# Patient Record
Sex: Female | Born: 1980 | Race: White | Hispanic: No | Marital: Married | Smoking: Never smoker
Health system: Southern US, Community
[De-identification: ages and names within clinical notes are randomized; demographics above are authoritative.]

## PROBLEM LIST (undated history)

## (undated) DIAGNOSIS — J45909 Unspecified asthma, uncomplicated: Secondary | ICD-10-CM

## (undated) DIAGNOSIS — K219 Gastro-esophageal reflux disease without esophagitis: Secondary | ICD-10-CM

## (undated) DIAGNOSIS — Z8619 Personal history of other infectious and parasitic diseases: Secondary | ICD-10-CM

## (undated) DIAGNOSIS — J302 Other seasonal allergic rhinitis: Secondary | ICD-10-CM

## (undated) DIAGNOSIS — Z9109 Other allergy status, other than to drugs and biological substances: Secondary | ICD-10-CM

## (undated) DIAGNOSIS — N390 Urinary tract infection, site not specified: Secondary | ICD-10-CM

## (undated) DIAGNOSIS — D649 Anemia, unspecified: Secondary | ICD-10-CM

## (undated) DIAGNOSIS — I82811 Embolism and thrombosis of superficial veins of right lower extremities: Secondary | ICD-10-CM

## (undated) DIAGNOSIS — N939 Abnormal uterine and vaginal bleeding, unspecified: Secondary | ICD-10-CM

## (undated) DIAGNOSIS — L309 Dermatitis, unspecified: Secondary | ICD-10-CM

## (undated) HISTORY — PX: NO PAST SURGERIES: SHX2092

## (undated) HISTORY — DX: Personal history of other infectious and parasitic diseases: Z86.19

## (undated) HISTORY — DX: Embolism and thrombosis of superficial veins of right lower extremity: I82.811

## (undated) HISTORY — DX: Other seasonal allergic rhinitis: J30.2

## (undated) HISTORY — DX: Urinary tract infection, site not specified: N39.0

## (undated) HISTORY — DX: Other allergy status, other than to drugs and biological substances: Z91.09

## (undated) HISTORY — DX: Unspecified asthma, uncomplicated: J45.909

---

## 2014-06-18 DIAGNOSIS — I82811 Embolism and thrombosis of superficial veins of right lower extremities: Secondary | ICD-10-CM

## 2014-06-18 HISTORY — DX: Embolism and thrombosis of superficial veins of right lower extremity: I82.811

## 2014-07-23 ENCOUNTER — Ambulatory Visit: Payer: Self-pay | Admitting: Physician Assistant

## 2014-08-04 ENCOUNTER — Ambulatory Visit (INDEPENDENT_AMBULATORY_CARE_PROVIDER_SITE_OTHER): Payer: BLUE CROSS/BLUE SHIELD | Admitting: Physician Assistant

## 2014-08-04 ENCOUNTER — Encounter: Payer: Self-pay | Admitting: Physician Assistant

## 2014-08-04 VITALS — BP 127/80 | HR 84 | Temp 98.4°F | Resp 16 | Ht 64.0 in | Wt 169.2 lb

## 2014-08-04 DIAGNOSIS — J4520 Mild intermittent asthma, uncomplicated: Secondary | ICD-10-CM

## 2014-08-04 DIAGNOSIS — E8809 Other disorders of plasma-protein metabolism, not elsewhere classified: Secondary | ICD-10-CM | POA: Insufficient documentation

## 2014-08-04 DIAGNOSIS — Z0000 Encounter for general adult medical examination without abnormal findings: Secondary | ICD-10-CM

## 2014-08-04 DIAGNOSIS — E756 Lipid storage disorder, unspecified: Secondary | ICD-10-CM

## 2014-08-04 DIAGNOSIS — J45909 Unspecified asthma, uncomplicated: Secondary | ICD-10-CM | POA: Insufficient documentation

## 2014-08-04 DIAGNOSIS — Z23 Encounter for immunization: Secondary | ICD-10-CM

## 2014-08-04 NOTE — Progress Notes (Signed)
Patient presents to clinic today to establish care.  Acute Concerns: Patient requesting CPE and Biometric screen.  Is not fasting for labs.  Chronic Issues: Asthma -- in childhood.  Still with symptoms very infrequently.  Uses her SABA maybe twice per year.  Denies nighttime symptoms or exacerbations.  Health Maintenance: Dental -- up-to-date Vision -- up-to-date Immunizations -- Declines flu shot. Is due for Tetanus booster.  Will be getting today. PAP -- November 2015; no abnormal findings.  Followed by OB/GYN [place] Dec)  Past Medical History  Diagnosis Date  . Asthma   . History of chicken pox   . Seasonal allergies   . Environmental allergies   . UTI (lower urinary tract infection)     Past Surgical History  Procedure Laterality Date  . No past surgeries      No current outpatient prescriptions on file prior to visit.   No current facility-administered medications on file prior to visit.    Allergies  Allergen Reactions  . Gelatin Anaphylaxis    Nothing Made In Gel [caps]  . Other Anaphylaxis    ALL Mammal Meat Products    Family History  Problem Relation Age of Onset  . Hyperlipidemia Father     Living  . Diabetes Mother     Living  . Hypertension Mother   . Hypertension Father   . Allergy (severe) Mother     Alpha-gal  . Other Mother     IVP Dye  . Heart disease Maternal Grandfather   . COPD Paternal Grandfather   . Healthy Sister     x1  . Allergies Daughter     x1  . Healthy Daughter     x2    History   Social History  . Marital Status: Married    Spouse Name: N/A  . Number of Children: N/A  . Years of Education: N/A   Occupational History  . farmer    Social History Main Topics  . Smoking status: Never Smoker   . Smokeless tobacco: Never Used  . Alcohol Use: 0.6 oz/week    1 Standard drinks or equivalent per week  . Drug Use: No  . Sexual Activity:    Partners: Male   Other Topics Concern  . Not on file   Social  History Narrative   Review of Systems  Constitutional: Negative for fever and weight loss.  HENT: Negative for ear discharge, ear pain, hearing loss and tinnitus.   Eyes: Negative for blurred vision, double vision, photophobia and pain.  Respiratory: Negative for cough and shortness of breath.   Cardiovascular: Negative for chest pain and palpitations.  Gastrointestinal: Negative for heartburn, nausea, vomiting, abdominal pain, diarrhea, constipation, blood in stool and melena.  Genitourinary: Negative for dysuria, urgency, frequency, hematuria and flank pain.  Musculoskeletal: Negative for falls.  Neurological: Negative for dizziness, loss of consciousness and headaches.  Endo/Heme/Allergies: Negative for environmental allergies.  Psychiatric/Behavioral: Negative for depression, suicidal ideas, hallucinations and substance abuse. The patient is not nervous/anxious and does not have insomnia.     BP 127/80 mmHg  Pulse 84  Temp(Src) 98.4 F (36.9 C) (Oral)  Resp 16  Ht  (1.626 m)  Wt 169 lb 4 oz (76.771 kg)  BMI 29.04 kg/m2  SpO2 100%  LMP 07/21/2014  Physical Exam  Constitutional: She is oriented to person, place, and time and well-developed, well-nourished, and in no distress.  HENT:  Head: Normocephalic and atraumatic.  Right Ear: Tympanic membrane, external ear and  ear canal normal.  Left Ear: Tympanic membrane, external ear and ear canal normal.  Nose: Nose normal. No mucosal edema.  Mouth/Throat: Uvula is midline, oropharynx is clear and moist and mucous membranes are normal. No oropharyngeal exudate or posterior oropharyngeal erythema.  Eyes: Conjunctivae are normal. Pupils are equal, round, and reactive to light.  Neck: Neck supple. No thyromegaly present.  Cardiovascular: Normal rate, regular rhythm, normal heart sounds and intact distal pulses.   Pulmonary/Chest: Effort normal and breath sounds normal. No respiratory distress. She has no wheezes. She has no rales.    Abdominal: Soft. Bowel sounds are normal. She exhibits no distension and no mass. There is no tenderness. There is no rebound and no guarding.  Lymphadenopathy:    She has no cervical adenopathy.  Neurological: She is alert and oriented to person, place, and time. No cranial nerve deficit.  Skin: Skin is warm and dry. No rash noted.  Psychiatric: Affect normal.  Vitals reviewed.  Assessment/Plan: Visit for preventive health examination I have reviewed the patient's medical history in detail and updated the computerized patient record. Flu shot declined.TDaP given today by nursing staff.  PHQ-9 Depression Screen performed with score of 0.  Preventive care discussed with patient.  Handout given.  Orders for labs placed.  Patient will return fasting for blood work.  Will call once Biometric Forms are complete.   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   Allergic asthma Asthma History obtained.  Very rare symptoms.  Will keep Albuterol on hand just if needed.  No further assessment needed.

## 2014-08-04 NOTE — Patient Instructions (Signed)
Please schedule a lab appointment for your fasting blood work. I will call you once I have your results. If anything is abnormal, we will treat you accordingly and get you back in for a follow-up appointment. If all is good, I want to see you yearly for a physical and then whenever you need me.  I will let you know once I have faxed your forms over.  Preventive Care for Adults A healthy lifestyle and preventive care can promote health and wellness. Preventive health guidelines for women include the following key practices.  A routine yearly physical is a good way to check with your health care provider about your health and preventive screening. It is a chance to share any concerns and updates on your health and to receive a thorough exam.  Visit your dentist for a routine exam and preventive care every 6 months. Brush your teeth twice a day and floss once a day. Good oral hygiene prevents tooth decay and gum disease.  The frequency of eye exams is based on your age, health, family medical history, use of contact lenses, and other factors. Follow your health care provider's recommendations for frequency of eye exams.  Eat a healthy diet. Foods like vegetables, fruits, whole grains, low-fat dairy products, and lean protein foods contain the nutrients you need without too many calories. Decrease your intake of foods high in solid fats, added sugars, and salt. Eat the right amount of calories for you.Get information about a proper diet from your health care provider, if necessary.  Regular physical exercise is one of the most important things you can do for your health. Most adults should get at least 150 minutes of moderate-intensity exercise (any activity that increases your heart rate and causes you to sweat) each week. In addition, most adults need muscle-strengthening exercises on 2 or more days a week.  Maintain a healthy weight. The body mass index (BMI) is a screening tool to identify  possible weight problems. It provides an estimate of body fat based on height and weight. Your health care provider can find your BMI and can help you achieve or maintain a healthy weight.For adults 20 years and older:  A BMI below 18.5 is considered underweight.  A BMI of 18.5 to 24.9 is normal.  A BMI of 25 to 29.9 is considered overweight.  A BMI of 30 and above is considered obese.  Maintain normal blood lipids and cholesterol levels by exercising and minimizing your intake of saturated fat. Eat a balanced diet with plenty of fruit and vegetables. Blood tests for lipids and cholesterol should begin at age 9 and be repeated every 5 years. If your lipid or cholesterol levels are high, you are over 50, or you are at high risk for heart disease, you may need your cholesterol levels checked more frequently.Ongoing high lipid and cholesterol levels should be treated with medicines if diet and exercise are not working.  If you smoke, find out from your health care provider how to quit. If you do not use tobacco, do not start.  Lung cancer screening is recommended for adults aged 74-80 years who are at high risk for developing lung cancer because of a history of smoking. A yearly low-dose CT scan of the lungs is recommended for people who have at least a 30-pack-year history of smoking and are a current smoker or have quit within the past 15 years. A pack year of smoking is smoking an average of 1 pack of cigarettes a  day for 1 year (for example: 1 pack a day for 30 years or 2 packs a day for 15 years). Yearly screening should continue until the smoker has stopped smoking for at least 15 years. Yearly screening should be stopped for people who develop a health problem that would prevent them from having lung cancer treatment.  If you are pregnant, do not drink alcohol. If you are breastfeeding, be very cautious about drinking alcohol. If you are not pregnant and choose to drink alcohol, do not have  more than 1 drink per day. One drink is considered to be 12 ounces (355 mL) of beer, 5 ounces (148 mL) of wine, or 1.5 ounces (44 mL) of liquor.  Avoid use of street drugs. Do not share needles with anyone. Ask for help if you need support or instructions about stopping the use of drugs.  High blood pressure causes heart disease and increases the risk of stroke. Your blood pressure should be checked at least every 1 to 2 years. Ongoing high blood pressure should be treated with medicines if weight loss and exercise do not work.  If you are 65-63 years old, ask your health care provider if you should take aspirin to prevent strokes.  Diabetes screening involves taking a blood sample to check your fasting blood sugar level. This should be done once every 3 years, after age 55, if you are within normal weight and without risk factors for diabetes. Testing should be considered at a younger age or be carried out more frequently if you are overweight and have at least 1 risk factor for diabetes.  Breast cancer screening is essential preventive care for women. You should practice "breast self-awareness." This means understanding the normal appearance and feel of your breasts and may include breast self-examination. Any changes detected, no matter how small, should be reported to a health care provider. Women in their 59s and 30s should have a clinical breast exam (CBE) by a health care provider as part of a regular health exam every 1 to 3 years. After age 36, women should have a CBE every year. Starting at age 44, women should consider having a mammogram (breast X-ray test) every year. Women who have a family history of breast cancer should talk to their health care provider about genetic screening. Women at a high risk of breast cancer should talk to their health care providers about having an MRI and a mammogram every year.  Breast cancer gene (BRCA)-related cancer risk assessment is recommended for women  who have family members with BRCA-related cancers. BRCA-related cancers include breast, ovarian, tubal, and peritoneal cancers. Having family members with these cancers may be associated with an increased risk for harmful changes (mutations) in the breast cancer genes BRCA1 and BRCA2. Results of the assessment will determine the need for genetic counseling and BRCA1 and BRCA2 testing.  Routine pelvic exams to screen for cancer are no longer recommended for nonpregnant women who are considered low risk for cancer of the pelvic organs (ovaries, uterus, and vagina) and who do not have symptoms. Ask your health care provider if a screening pelvic exam is right for you.  If you have had past treatment for cervical cancer or a condition that could lead to cancer, you need Pap tests and screening for cancer for at least 20 years after your treatment. If Pap tests have been discontinued, your risk factors (such as having a new sexual partner) need to be reassessed to determine if screening should be  resumed. Some women have medical problems that increase the chance of getting cervical cancer. In these cases, your health care provider may recommend more frequent screening and Pap tests.  The HPV test is an additional test that may be used for cervical cancer screening. The HPV test looks for the virus that can cause the cell changes on the cervix. The cells collected during the Pap test can be tested for HPV. The HPV test could be used to screen women aged 60 years and older, and should be used in women of any age who have unclear Pap test results. After the age of 61, women should have HPV testing at the same frequency as a Pap test.  Colorectal cancer can be detected and often prevented. Most routine colorectal cancer screening begins at the age of 39 years and continues through age 27 years. However, your health care provider may recommend screening at an earlier age if you have risk factors for colon cancer. On a  yearly basis, your health care provider may provide home test kits to check for hidden blood in the stool. Use of a small camera at the end of a tube, to directly examine the colon (sigmoidoscopy or colonoscopy), can detect the earliest forms of colorectal cancer. Talk to your health care provider about this at age 16, when routine screening begins. Direct exam of the colon should be repeated every 5-10 years through age 62 years, unless early forms of pre-cancerous polyps or small growths are found.  People who are at an increased risk for hepatitis B should be screened for this virus. You are considered at high risk for hepatitis B if:  You were born in a country where hepatitis B occurs often. Talk with your health care provider about which countries are considered high risk.  Your parents were born in a high-risk country and you have not received a shot to protect against hepatitis B (hepatitis B vaccine).  You have HIV or AIDS.  You use needles to inject street drugs.  You live with, or have sex with, someone who has hepatitis B.  You get hemodialysis treatment.  You take certain medicines for conditions like cancer, organ transplantation, and autoimmune conditions.  Hepatitis C blood testing is recommended for all people born from [birth year] through 1965 and any individual with known risks for hepatitis C.  Practice safe sex. Use condoms and avoid high-risk sexual practices to reduce the spread of sexually transmitted infections (STIs). STIs include gonorrhea, chlamydia, syphilis, trichomonas, herpes, HPV, and human immunodeficiency virus (HIV). Herpes, HIV, and HPV are viral illnesses that have no cure. They can result in disability, cancer, and death.  You should be screened for sexually transmitted illnesses (STIs) including gonorrhea and chlamydia if:  You are sexually active and are younger than 24 years.  You are older than 24 years and your health care provider tells you that you  are at risk for this type of infection.  Your sexual activity has changed since you were last screened and you are at an increased risk for chlamydia or gonorrhea. Ask your health care provider if you are at risk.  If you are at risk of being infected with HIV, it is recommended that you take a prescription medicine daily to prevent HIV infection. This is called preexposure prophylaxis (PrEP). You are considered at risk if:  You are a heterosexual woman, are sexually active, and are at increased risk for HIV infection.  You take drugs by injection.  You  are sexually active with a partner who has HIV.  Talk with your health care provider about whether you are at high risk of being infected with HIV. If you choose to begin PrEP, you should first be tested for HIV. You should then be tested every 3 months for as long as you are taking PrEP.  Osteoporosis is a disease in which the bones lose minerals and strength with aging. This can result in serious bone fractures or breaks. The risk of osteoporosis can be identified using a bone density scan. Women ages 65 years and over and women at risk for fractures or osteoporosis should discuss screening with their health care providers. Ask your health care provider whether you should take a calcium supplement or vitamin D to reduce the rate of osteoporosis.  Menopause can be associated with physical symptoms and risks. Hormone replacement therapy is available to decrease symptoms and risks. You should talk to your health care provider about whether hormone replacement therapy is right for you.  Use sunscreen. Apply sunscreen liberally and repeatedly throughout the day. You should seek shade when your shadow is shorter than you. Protect yourself by wearing long sleeves, pants, a wide-brimmed hat, and sunglasses year round, whenever you are outdoors.  Once a month, do a whole body skin exam, using a mirror to look at the skin on your back. Tell your health  care provider of new moles, moles that have irregular borders, moles that are larger than a pencil eraser, or moles that have changed in shape or color.  Stay current with required vaccines (immunizations).  Influenza vaccine. All adults should be immunized every year.  Tetanus, diphtheria, and acellular pertussis (Td, Tdap) vaccine. Pregnant women should receive 1 dose of Tdap vaccine during each pregnancy. The dose should be obtained regardless of the length of time since the last dose. Immunization is preferred during the 27th-36th week of gestation. An adult who has not previously received Tdap or who does not know her vaccine status should receive 1 dose of Tdap. This initial dose should be followed by tetanus and diphtheria toxoids (Td) booster doses every 10 years. Adults with an unknown or incomplete history of completing a 3-dose immunization series with Td-containing vaccines should begin or complete a primary immunization series including a Tdap dose. Adults should receive a Td booster every 10 years.  Varicella vaccine. An adult without evidence of immunity to varicella should receive 2 doses or a second dose if she has previously received 1 dose. Pregnant females who do not have evidence of immunity should receive the first dose after pregnancy. This first dose should be obtained before leaving the health care facility. The second dose should be obtained 4-8 weeks after the first dose.  Human papillomavirus (HPV) vaccine. Females aged 13-26 years who have not received the vaccine previously should obtain the 3-dose series. The vaccine is not recommended for use in pregnant females. However, pregnancy testing is not needed before receiving a dose. If a female is found to be pregnant after receiving a dose, no treatment is needed. In that case, the remaining doses should be delayed until after the pregnancy. Immunization is recommended for any person with an immunocompromised condition through  the age of 68 years if she did not get any or all doses earlier. During the 3-dose series, the second dose should be obtained 4-8 weeks after the first dose. The third dose should be obtained 24 weeks after the first dose and 16 weeks after the second  dose.  Zoster vaccine. One dose is recommended for adults aged 19 years or older unless certain conditions are present.  Measles, mumps, and rubella (MMR) vaccine. Adults born before 58 generally are considered immune to measles and mumps. Adults born in [birth year] or later should have 1 or more doses of MMR vaccine unless there is a contraindication to the vaccine or there is laboratory evidence of immunity to each of the three diseases. A routine second dose of MMR vaccine should be obtained at least 28 days after the first dose for students attending postsecondary schools, health care workers, or international travelers. People who received inactivated measles vaccine or an unknown type of measles vaccine during 1963-1967 should receive 2 doses of MMR vaccine. People who received inactivated mumps vaccine or an unknown type of mumps vaccine before 1979 and are at high risk for mumps infection should consider immunization with 2 doses of MMR vaccine. For females of childbearing age, rubella immunity should be determined. If there is no evidence of immunity, females who are not pregnant should be vaccinated. If there is no evidence of immunity, females who are pregnant should delay immunization until after pregnancy. Unvaccinated health care workers born before 60 who lack laboratory evidence of measles, mumps, or rubella immunity or laboratory confirmation of disease should consider measles and mumps immunization with 2 doses of MMR vaccine or rubella immunization with 1 dose of MMR vaccine.  Pneumococcal 13-valent conjugate (PCV13) vaccine. When indicated, a person who is uncertain of her immunization history and has no record of immunization should receive the  PCV13 vaccine. An adult aged 43 years or older who has certain medical conditions and has not been previously immunized should receive 1 dose of PCV13 vaccine. This PCV13 should be followed with a dose of pneumococcal polysaccharide (PPSV23) vaccine. The PPSV23 vaccine dose should be obtained at least 8 weeks after the dose of PCV13 vaccine. An adult aged 21 years or older who has certain medical conditions and previously received 1 or more doses of PPSV23 vaccine should receive 1 dose of PCV13. The PCV13 vaccine dose should be obtained 1 or more years after the last PPSV23 vaccine dose.  Pneumococcal polysaccharide (PPSV23) vaccine. When PCV13 is also indicated, PCV13 should be obtained first. All adults aged 66 years and older should be immunized. An adult younger than age 64 years who has certain medical conditions should be immunized. Any person who resides in a nursing home or long-term care facility should be immunized. An adult smoker should be immunized. People with an immunocompromised condition and certain other conditions should receive both PCV13 and PPSV23 vaccines. People with human immunodeficiency virus (HIV) infection should be immunized as soon as possible after diagnosis. Immunization during chemotherapy or radiation therapy should be avoided. Routine use of PPSV23 vaccine is not recommended for American Indians, [place] Natives, or people younger than 65 years unless there are medical conditions that require PPSV23 vaccine. When indicated, people who have unknown immunization and have no record of immunization should receive PPSV23 vaccine. One-time revaccination 5 years after the first dose of PPSV23 is recommended for people aged 19-64 years who have chronic kidney failure, nephrotic syndrome, asplenia, or immunocompromised conditions. People who received 1-2 doses of PPSV23 before age 75 years should receive another dose of PPSV23 vaccine at age 84 years or later if at least 5 years have  passed since the previous dose. Doses of PPSV23 are not needed for people immunized with PPSV23 at or after age 17 years.  Meningococcal vaccine. Adults with asplenia or persistent complement component deficiencies should receive 2 doses of quadrivalent meningococcal conjugate (MenACWY-D) vaccine. The doses should be obtained at least 2 months apart. Microbiologists working with certain meningococcal bacteria, [place] recruits, people at risk during an outbreak, and people who travel to or live in countries with a high rate of meningitis should be immunized. A first-year college student up through age 3 years who is living in a residence hall should receive a dose if she did not receive a dose on or after her 16th birthday. Adults who have certain high-risk conditions should receive one or more doses of vaccine.  Hepatitis A vaccine. Adults who wish to be protected from this disease, have certain high-risk conditions, work with hepatitis A-infected animals, work in hepatitis A research labs, or travel to or work in countries with a high rate of hepatitis A should be immunized. Adults who were previously unvaccinated and who anticipate close contact with an international adoptee during the first 60 days after arrival in the [place] from a country with a high rate of hepatitis A should be immunized.  Hepatitis B vaccine. Adults who wish to be protected from this disease, have certain high-risk conditions, may be exposed to blood or other infectious body fluids, are household contacts or sex partners of hepatitis B positive people, are clients or workers in certain care facilities, or travel to or work in countries with a high rate of hepatitis B should be immunized.  Haemophilus influenzae type b (Hib) vaccine. A previously unvaccinated person with asplenia or sickle cell disease or having a scheduled splenectomy should receive 1 dose of Hib vaccine. Regardless of previous immunization, a recipient of  a hematopoietic stem cell transplant should receive a 3-dose series 6-12 months after her successful transplant. Hib vaccine is not recommended for adults with HIV infection. Preventive Services / Frequency Ages 70 to 30 years  Blood pressure check.** / Every 1 to 2 years.  Lipid and cholesterol check.** / Every 5 years beginning at age 5.  Clinical breast exam.** / Every 3 years for women in their 61s and 38s.  BRCA-related cancer risk assessment.** / For women who have family members with a BRCA-related cancer (breast, ovarian, tubal, or peritoneal cancers).  Pap test.** / Every 2 years from ages 4 through 62. Every 3 years starting at age 16 through age 31 or 65 with a history of 3 consecutive normal Pap tests.  HPV screening.** / Every 3 years from ages 66 through ages 23 to 6 with a history of 3 consecutive normal Pap tests.  Hepatitis C blood test.** / For any individual with known risks for hepatitis C.  Skin self-exam. / Monthly.  Influenza vaccine. / Every year.  Tetanus, diphtheria, and acellular pertussis (Tdap, Td) vaccine.** / Consult your health care provider. Pregnant women should receive 1 dose of Tdap vaccine during each pregnancy. 1 dose of Td every 10 years.  Varicella vaccine.** / Consult your health care provider. Pregnant females who do not have evidence of immunity should receive the first dose after pregnancy.  HPV vaccine. / 3 doses over 6 months, if 54 and younger. The vaccine is not recommended for use in pregnant females. However, pregnancy testing is not needed before receiving a dose.  Measles, mumps, rubella (MMR) vaccine.** / You need at least 1 dose of MMR if you were born in 1957 or later. You may also need a 2nd dose. For females of childbearing age, rubella immunity should  be determined. If there is no evidence of immunity, females who are not pregnant should be vaccinated. If there is no evidence of immunity, females who are pregnant should delay  immunization until after pregnancy.  Pneumococcal 13-valent conjugate (PCV13) vaccine.** / Consult your health care provider.  Pneumococcal polysaccharide (PPSV23) vaccine.** / 1 to 2 doses if you smoke cigarettes or if you have certain conditions.  Meningococcal vaccine.** / 1 dose if you are age 65 to 49 years and a Market researcher living in a residence hall, or have one of several medical conditions, you need to get vaccinated against meningococcal disease. You may also need additional booster doses.  Hepatitis A vaccine.** / Consult your health care provider.  Hepatitis B vaccine.** / Consult your health care provider.  Haemophilus influenzae type b (Hib) vaccine.** / Consult your health care provider. Ages 55 to 57 years  Blood pressure check.** / Every 1 to 2 years.  Lipid and cholesterol check.** / Every 5 years beginning at age 31 years.  Lung cancer screening. / Every year if you are aged 58-80 years and have a 30-pack-year history of smoking and currently smoke or have quit within the past 15 years. Yearly screening is stopped once you have quit smoking for at least 15 years or develop a health problem that would prevent you from having lung cancer treatment.  Clinical breast exam.** / Every year after age 37 years.  BRCA-related cancer risk assessment.** / For women who have family members with a BRCA-related cancer (breast, ovarian, tubal, or peritoneal cancers).  Mammogram.** / Every year beginning at age 78 years and continuing for as long as you are in good health. Consult with your health care provider.  Pap test.** / Every 3 years starting at age 88 years through age 57 or 64 years with a history of 3 consecutive normal Pap tests.  HPV screening.** / Every 3 years from ages 47 years through ages 57 to 52 years with a history of 3 consecutive normal Pap tests.  Fecal occult blood test (FOBT) of stool. / Every year beginning at age 61 years and continuing  until age 14 years. You may not need to do this test if you get a colonoscopy every 10 years.  Flexible sigmoidoscopy or colonoscopy.** / Every 5 years for a flexible sigmoidoscopy or every 10 years for a colonoscopy beginning at age 40 years and continuing until age 61 years.  Hepatitis C blood test.** / For all people born from [birth year] through 1965 and any individual with known risks for hepatitis C.  Skin self-exam. / Monthly.  Influenza vaccine. / Every year.  Tetanus, diphtheria, and acellular pertussis (Tdap/Td) vaccine.** / Consult your health care provider. Pregnant women should receive 1 dose of Tdap vaccine during each pregnancy. 1 dose of Td every 10 years.  Varicella vaccine.** / Consult your health care provider. Pregnant females who do not have evidence of immunity should receive the first dose after pregnancy.  Zoster vaccine.** / 1 dose for adults aged 65 years or older.  Measles, mumps, rubella (MMR) vaccine.** / You need at least 1 dose of MMR if you were born in 1957 or later. You may also need a 2nd dose. For females of childbearing age, rubella immunity should be determined. If there is no evidence of immunity, females who are not pregnant should be vaccinated. If there is no evidence of immunity, females who are pregnant should delay immunization until after pregnancy.  Pneumococcal 13-valent conjugate (PCV13) vaccine.** /  Consult your health care provider.  Pneumococcal polysaccharide (PPSV23) vaccine.** / 1 to 2 doses if you smoke cigarettes or if you have certain conditions.  Meningococcal vaccine.** / Consult your health care provider.  Hepatitis A vaccine.** / Consult your health care provider.  Hepatitis B vaccine.** / Consult your health care provider.  Haemophilus influenzae type b (Hib) vaccine.** / Consult your health care provider. Ages 23 years and over  Blood pressure check.** / Every 1 to 2 years.  Lipid and cholesterol check.** / Every 5 years  beginning at age 52 years.  Lung cancer screening. / Every year if you are aged 10-80 years and have a 30-pack-year history of smoking and currently smoke or have quit within the past 15 years. Yearly screening is stopped once you have quit smoking for at least 15 years or develop a health problem that would prevent you from having lung cancer treatment.  Clinical breast exam.** / Every year after age 50 years.  BRCA-related cancer risk assessment.** / For women who have family members with a BRCA-related cancer (breast, ovarian, tubal, or peritoneal cancers).  Mammogram.** / Every year beginning at age 73 years and continuing for as long as you are in good health. Consult with your health care provider.  Pap test.** / Every 3 years starting at age 65 years through age 55 or 9 years with 3 consecutive normal Pap tests. Testing can be stopped between 65 and 70 years with 3 consecutive normal Pap tests and no abnormal Pap or HPV tests in the past 10 years.  HPV screening.** / Every 3 years from ages 102 years through ages 53 or 12 years with a history of 3 consecutive normal Pap tests. Testing can be stopped between 65 and 70 years with 3 consecutive normal Pap tests and no abnormal Pap or HPV tests in the past 10 years.  Fecal occult blood test (FOBT) of stool. / Every year beginning at age 50 years and continuing until age 57 years. You may not need to do this test if you get a colonoscopy every 10 years.  Flexible sigmoidoscopy or colonoscopy.** / Every 5 years for a flexible sigmoidoscopy or every 10 years for a colonoscopy beginning at age 38 years and continuing until age 27 years.  Hepatitis C blood test.** / For all people born from [birth year] through 1965 and any individual with known risks for hepatitis C.  Osteoporosis screening.** / A one-time screening for women ages 32 years and over and women at risk for fractures or osteoporosis.  Skin self-exam. / Monthly.  Influenza vaccine. /  Every year.  Tetanus, diphtheria, and acellular pertussis (Tdap/Td) vaccine.** / 1 dose of Td every 10 years.  Varicella vaccine.** / Consult your health care provider.  Zoster vaccine.** / 1 dose for adults aged 84 years or older.  Pneumococcal 13-valent conjugate (PCV13) vaccine.** / Consult your health care provider.  Pneumococcal polysaccharide (PPSV23) vaccine.** / 1 dose for all adults aged 40 years and older.  Meningococcal vaccine.** / Consult your health care provider.  Hepatitis A vaccine.** / Consult your health care provider.  Hepatitis B vaccine.** / Consult your health care provider.  Haemophilus influenzae type b (Hib) vaccine.** / Consult your health care provider. ** Family history and personal history of risk and conditions may change your health care provider's recommendations. Document Released: 07/31/2001 Document Revised: 10/19/2013 Document Reviewed: 10/30/2010 [facility] Patient Information 2015 [place], [place]. This information is not intended to replace advice given to you by your health  care provider. Make sure you discuss any questions you have with your health care provider.

## 2014-08-04 NOTE — Assessment & Plan Note (Signed)
Followed by Allergy Specialist.

## 2014-08-04 NOTE — Assessment & Plan Note (Addendum)
I have reviewed the patient's medical history in detail and updated the computerized patient record. Flu shot declined.TDaP given today by nursing staff.  PHQ-9 Depression Screen performed with score of 0.  Preventive care discussed with patient.  Handout given.  Orders for labs placed.  Patient will return fasting for blood work.  Will call once Biometric Forms are complete.

## 2014-08-04 NOTE — Progress Notes (Signed)
Pre visit review using our clinic review tool, if applicable. No additional management support is needed unless otherwise documented below in the visit note/SLS  

## 2014-08-04 NOTE — Assessment & Plan Note (Signed)
Asthma History obtained.  Very rare symptoms.  Will keep Albuterol on hand just if needed.  No further assessment needed.

## 2014-08-05 ENCOUNTER — Other Ambulatory Visit (INDEPENDENT_AMBULATORY_CARE_PROVIDER_SITE_OTHER): Payer: BLUE CROSS/BLUE SHIELD

## 2014-08-05 DIAGNOSIS — Z0000 Encounter for general adult medical examination without abnormal findings: Secondary | ICD-10-CM

## 2014-08-05 LAB — URINALYSIS, ROUTINE W REFLEX MICROSCOPIC
Bilirubin Urine: NEGATIVE
Ketones, ur: NEGATIVE
Leukocytes, UA: NEGATIVE
Nitrite: NEGATIVE
Specific Gravity, Urine: 1.025 (ref 1.000–1.030)
Total Protein, Urine: NEGATIVE
Urine Glucose: NEGATIVE
Urobilinogen, UA: 0.2 (ref 0.0–1.0)
pH: 6 (ref 5.0–8.0)

## 2014-08-05 LAB — CBC
HCT: 35.3 % — ABNORMAL LOW (ref 36.0–46.0)
Hemoglobin: 11.9 g/dL — ABNORMAL LOW (ref 12.0–15.0)
MCHC: 33.7 g/dL (ref 30.0–36.0)
MCV: 91.6 fl (ref 78.0–100.0)
Platelets: 246 10*3/uL (ref 150.0–400.0)
RBC: 3.86 Mil/uL — ABNORMAL LOW (ref 3.87–5.11)
RDW: 13.6 % (ref 11.5–15.5)
WBC: 6.4 10*3/uL (ref 4.0–10.5)

## 2014-08-05 LAB — HEPATIC FUNCTION PANEL
ALT: 16 U/L (ref 0–35)
AST: 16 U/L (ref 0–37)
Albumin: 4.1 g/dL (ref 3.5–5.2)
Alkaline Phosphatase: 39 U/L (ref 39–117)
Bilirubin, Direct: 0 mg/dL (ref 0.0–0.3)
Total Bilirubin: 0.4 mg/dL (ref 0.2–1.2)
Total Protein: 7.2 g/dL (ref 6.0–8.3)

## 2014-08-05 LAB — LIPID PANEL
Cholesterol: 242 mg/dL — ABNORMAL HIGH (ref 0–200)
HDL: 77 mg/dL (ref >39.00)
LDL Cholesterol: 146 mg/dL — ABNORMAL HIGH (ref 0–99)
NonHDL: 165
Total CHOL/HDL Ratio: 3
Triglycerides: 96 mg/dL (ref 0.0–149.0)
VLDL: 19.2 mg/dL (ref 0.0–40.0)

## 2014-08-05 LAB — BASIC METABOLIC PANEL
BUN: 12 mg/dL (ref 6–23)
CO2: 23 mEq/L (ref 19–32)
Calcium: 9.4 mg/dL (ref 8.4–10.5)
Chloride: 109 mEq/L (ref 96–112)
Creatinine, Ser: 0.82 mg/dL (ref 0.40–1.20)
GFR: 84.84 mL/min (ref >60.00)
Glucose, Bld: 88 mg/dL (ref 70–99)
Potassium: 4 mEq/L (ref 3.5–5.1)
Sodium: 140 mEq/L (ref 135–145)

## 2014-08-05 LAB — TSH: TSH: 1.7 u[IU]/mL (ref 0.35–4.50)

## 2014-08-05 LAB — HEMOGLOBIN A1C: Hgb A1c MFr Bld: 5.4 % (ref 4.6–6.5)

## 2014-08-06 ENCOUNTER — Telehealth: Payer: Self-pay | Admitting: *Deleted

## 2014-08-06 DIAGNOSIS — E785 Hyperlipidemia, unspecified: Secondary | ICD-10-CM

## 2014-08-06 NOTE — Telephone Encounter (Signed)
-----   Message from Waldon MerlWilliam C Martin, PA-C sent at 08/06/2014  9:55 AM EST ----- Labs good overall.  Total cholesterol slightly elevated.  No medication needed presently.  Limit intake of foods high in cholesterol and saturated fats.  Stay well hydrated.  Will repeat in 6 months. I have faxed over Biometric Forms.

## 2014-08-06 NOTE — Telephone Encounter (Signed)
Left detailed message on home/cell # and to call and schedule lab appt in 6 months. Future lab order entered.

## 2014-09-14 ENCOUNTER — Ambulatory Visit (INDEPENDENT_AMBULATORY_CARE_PROVIDER_SITE_OTHER): Payer: BLUE CROSS/BLUE SHIELD | Admitting: Physician Assistant

## 2014-09-14 ENCOUNTER — Encounter: Payer: Self-pay | Admitting: Physician Assistant

## 2014-09-14 VITALS — BP 98/66 | HR 69 | Temp 98.3°F | Resp 16 | Ht 64.0 in | Wt 163.5 lb

## 2014-09-14 DIAGNOSIS — M545 Low back pain, unspecified: Secondary | ICD-10-CM

## 2014-09-14 MED ORDER — DICLOFENAC SODIUM 75 MG PO TBEC: 75.0000 mg | DELAYED_RELEASE_TABLET | Freq: Two times a day (BID) | ORAL | Status: DC

## 2014-09-14 MED ORDER — CYCLOBENZAPRINE HCL 10 MG PO TABS
10.0000 mg | ORAL_TABLET | Freq: Every day | ORAL | Status: DC
Start: 2014-09-14 — End: 2015-07-15

## 2014-09-14 NOTE — Patient Instructions (Signed)
Please take the Voltaren as directed with food. Use the Flexeril at bedtime. Continue alternating between ice and heat. Continue the brace. Avoid heavy lifting or over exertion. Rest will be your best friend over the next week.  Call or return to clinic if symptoms not improving.  Back Pain, Adult Low back pain is very common. About 1 in 5 people have back pain.The cause of low back pain is rarely dangerous. The pain often gets better over time.About half of people with a sudden onset of back pain feel better in just 2 weeks. About 8 in 10 people feel better by 6 weeks.  CAUSES Some common causes of back pain include:  Strain of the muscles or ligaments supporting the spine.  Wear and tear (degeneration) of the spinal discs.  Arthritis.  Direct injury to the back. DIAGNOSIS Most of the time, the direct cause of low back pain is not known.However, back pain can be treated effectively even when the exact cause of the pain is unknown.Answering your caregiver's questions about your overall health and symptoms is one of the most accurate ways to make sure the cause of your pain is not dangerous. If your caregiver needs more information, he or she may order lab work or imaging tests (X-rays or MRIs).However, even if imaging tests show changes in your back, this usually does not require surgery. HOME CARE INSTRUCTIONS For many people, back pain returns.Since low back pain is rarely dangerous, it is often a condition that people can learn to [facility] their own.   Remain active. It is stressful on the back to sit or stand in one place. Do not sit, drive, or stand in one place for more than 30 minutes at a time. Take short walks on level surfaces as soon as pain allows.Try to increase the length of time you walk each day.  Do not stay in bed.Resting more than 1 or 2 days can delay your recovery.  Do not avoid exercise or work.Your body is made to move.It is not dangerous to be active,  even though your back may hurt.Your back will likely heal faster if you return to being active before your pain is gone.  Pay attention to your body when you bend and lift. Many people have less discomfortwhen lifting if they bend their knees, keep the load close to their bodies,and avoid twisting. Often, the most comfortable positions are those that put less stress on your recovering back.  Find a comfortable position to sleep. Use a firm mattress and lie on your side with your knees slightly bent. If you lie on your back, put a pillow under your knees.  Only take over-the-counter or prescription medicines as directed by your caregiver. Over-the-counter medicines to reduce pain and inflammation are often the most helpful.Your caregiver may prescribe muscle relaxant drugs.These medicines help dull your pain so you can more quickly return to your normal activities and healthy exercise.  Put ice on the injured area.  Put ice in a plastic bag.  Place a towel between your skin and the bag.  Leave the ice on for 15-20 minutes, 03-04 times a day for the first 2 to 3 days. After that, ice and heat may be alternated to reduce pain and spasms.  Ask your caregiver about trying back exercises and gentle massage. This may be of some benefit.  Avoid feeling anxious or stressed.Stress increases muscle tension and can worsen back pain.It is important to recognize when you are anxious or stressed and  learn ways to manage it.Exercise is a great option. SEEK MEDICAL CARE IF:  You have pain that is not relieved with rest or medicine.  You have pain that does not improve in 1 week.  You have new symptoms.  You are generally not feeling well. SEEK IMMEDIATE MEDICAL CARE IF:   You have pain that radiates from your back into your legs.  You develop new bowel or bladder control problems.  You have unusual weakness or numbness in your arms or legs.  You develop nausea or vomiting.  You develop  abdominal pain.  You feel faint. Document Released: 06/04/2005 Document Revised: 12/04/2011 Document Reviewed: 10/06/2013 [facility] Patient Information 2015 [place], [place]. This information is not intended to replace advice given to you by your health care provider. Make sure you discuss any questions you have with your health care provider.

## 2014-09-14 NOTE — Progress Notes (Signed)
Pre visit review using our clinic review tool, if applicable. No additional management support is needed unless otherwise documented below in the visit note/SLS  

## 2014-09-17 DIAGNOSIS — M545 Low back pain, unspecified: Secondary | ICD-10-CM | POA: Insufficient documentation

## 2014-09-17 NOTE — Assessment & Plan Note (Signed)
Rx diclofenac BID.  Instructed patient to take with food.  Rx Flexeril at bedtime. Topical Aspercreme encouraged. Supportive measures discussed. Follow-up if symptoms are not improving.

## 2014-09-17 NOTE — Progress Notes (Signed)
Patient presents to clinic today c/o  Bilateral low back pain extending into her hips, that has been present for a few days. Patient states symptoms started after doing some heavy lifting. Denies radiation into legs. Denies numbness, tingling or weakness of lower extremities. Denies saddle anesthesia. Denies change to bowel or bladder habits. Has taken Motrin for symptoms with little relief.  Past Medical History  Diagnosis Date  . Asthma   . History of chicken pox   . Seasonal allergies   . Environmental allergies   . UTI (lower urinary tract infection)     Current Outpatient Prescriptions on File Prior to Visit  Medication Sig Dispense Refill  . albuterol (PROVENTIL HFA;VENTOLIN HFA) 108 (90 BASE) MCG/ACT inhaler Inhale 1-2 puffs into the lungs every 6 (six) hours as needed for wheezing or shortness of breath.    . EPINEPHrine 0.3 mg/0.3 mL IJ SOAJ injection Inject into the muscle once.    . montelukast (SINGULAIR) 10 MG tablet Take 10 mg by mouth daily as needed.     No current facility-administered medications on file prior to visit.    Allergies  Allergen Reactions  . Gelatin Anaphylaxis    Nothing Made In Gel [caps]  . Other Anaphylaxis    ALL Mammal Meat Products    Family History  Problem Relation Age of Onset  . Hyperlipidemia Father     Living  . Diabetes Mother     Living  . Hypertension Mother   . Hypertension Father   . Allergy (severe) Mother     Alpha-gal  . Other Mother     IVP Dye  . Heart disease Maternal Grandfather   . COPD Paternal Grandfather   . Healthy Sister     x1  . Allergies Daughter     x1  . Healthy Daughter     x2    History   Social History  . Marital Status: Married    Spouse Name: N/A  . Number of Children: N/A  . Years of Education: N/A   Occupational History  . farmer    Social History Main Topics  . Smoking status: Never Smoker   . Smokeless tobacco: Never Used  . Alcohol Use: 0.6 oz/week    1 Standard drinks  or equivalent per week  . Drug Use: No  . Sexual Activity:    Partners: Male   Other Topics Concern  . None   Social History Narrative    Review of Systems - See HPI.  All other ROS are negative.  BP 98/66 mmHg  Pulse 69  Temp(Src) 98.3 F (36.8 C) (Oral)  Resp 16  Ht 5\' 4"  (1.626 m)  Wt 163 lb 8 oz (74.163 kg)  BMI 28.05 kg/m2  SpO2 100%  LMP 08/16/2014  Physical Exam  Constitutional: She is oriented to person, place, and time and well-developed, well-nourished, and in no distress.  HENT:  Head: Normocephalic and atraumatic.  Cardiovascular: Normal rate, regular rhythm, normal heart sounds and intact distal pulses.   Pulmonary/Chest: Effort normal and breath sounds normal. No respiratory distress. She has no wheezes. She has no rales. She exhibits no tenderness.  Musculoskeletal:       Lumbar back: She exhibits tenderness and spasm. She exhibits no bony tenderness.  Neurological: She is alert and oriented to person, place, and time. No cranial nerve deficit.  Vitals reviewed.   Recent Results (from the past 2160 hour(s))  CBC     Status: Abnormal  Collection Time: 08/05/14  8:23 AM  Result Value Ref Range   WBC 6.4 4.0 - 10.5 K/uL   RBC 3.86 (L) 3.87 - 5.11 Mil/uL   Platelets 246.0 150.0 - 400.0 K/uL   Hemoglobin 11.9 (L) 12.0 - 15.0 g/dL   HCT 16.1 (L) 09.6 - 04.5 %   MCV 91.6 78.0 - 100.0 fl   MCHC 33.7 30.0 - 36.0 g/dL   RDW 40.9 81.1 - 91.4 %  Basic metabolic panel     Status: None   Collection Time: 08/05/14  8:23 AM  Result Value Ref Range   Sodium 140 135 - 145 mEq/L   Potassium 4.0 3.5 - 5.1 mEq/L   Chloride 109 96 - 112 mEq/L   CO2 23 19 - 32 mEq/L   Glucose, Bld 88 70 - 99 mg/dL   BUN 12 6 - 23 mg/dL   Creatinine, Ser 7.82 0.40 - 1.20 mg/dL   Calcium 9.4 8.4 - 95.6 mg/dL   GFR 21.30 >86.57 mL/min  Hepatic function panel     Status: None   Collection Time: 08/05/14  8:23 AM  Result Value Ref Range   Total Bilirubin 0.4 0.2 - 1.2 mg/dL    Bilirubin, Direct 0.0 0.0 - 0.3 mg/dL   Alkaline Phosphatase 39 39 - 117 U/L   AST 16 0 - 37 U/L   ALT 16 0 - 35 U/L   Total Protein 7.2 6.0 - 8.3 g/dL   Albumin 4.1 3.5 - 5.2 g/dL  Hemoglobin Q4O     Status: None   Collection Time: 08/05/14  8:23 AM  Result Value Ref Range   Hgb A1c MFr Bld 5.4 4.6 - 6.5 %    Comment: Glycemic Control Guidelines for People with Diabetes:Non Diabetic:  <6%Goal of Therapy: <7%Additional Action Suggested:  >8%   Lipid panel     Status: Abnormal   Collection Time: 08/05/14  8:23 AM  Result Value Ref Range   Cholesterol 242 (H) 0 - 200 mg/dL    Comment: ATP III Classification       Desirable:  < 200 mg/dL               Borderline High:  200 - 239 mg/dL          High:  > = 962 mg/dL   Triglycerides 95.2 0.0 - 149.0 mg/dL    Comment: Normal:  <841 mg/dLBorderline High:  150 - 199 mg/dL   HDL 32.44 >01.02 mg/dL   VLDL 72.5 0.0 - 36.6 mg/dL   LDL Cholesterol 440 (H) 0 - 99 mg/dL   Total CHOL/HDL Ratio 3     Comment:                Men          Women1/2 Average Risk     3.4          3.3Average Risk          5.0          4.42X Average Risk          9.6          7.13X Average Risk          15.0          11.0                       NonHDL 165.00     Comment: NOTE:  Non-HDL goal should be 30 mg/dL higher  than patient's LDL goal (i.e. LDL goal of < 70 mg/dL, would have non-HDL goal of < 100 mg/dL)  TSH     Status: None   Collection Time: 08/05/14  8:23 AM  Result Value Ref Range   TSH 1.70 0.35 - 4.50 uIU/mL  Urinalysis, Routine w reflex microscopic     Status: Abnormal   Collection Time: 08/05/14  8:23 AM  Result Value Ref Range   Color, Urine YELLOW Yellow;Lt. Yellow   APPearance CLEAR Clear   Specific Gravity, Urine 1.025 1.000-1.030   pH 6.0 5.0 - 8.0   Total Protein, Urine NEGATIVE Negative   Urine Glucose NEGATIVE Negative   Ketones, ur NEGATIVE Negative   Bilirubin Urine NEGATIVE Negative   Hgb urine dipstick SMALL (A) Negative   Urobilinogen, UA 0.2  0.0 - 1.0   Leukocytes, UA NEGATIVE Negative   Nitrite NEGATIVE Negative   WBC, UA 0-2/hpf 0-2/hpf   RBC / HPF 0-2/hpf 0-2/hpf   Squamous Epithelial / LPF Rare(0-4/hpf) Rare(0-4/hpf)    Assessment/Plan: Bilateral low back pain without sciatica Rx diclofenac BID.  Instructed patient to take with food.  Rx Flexeril at bedtime. Topical Aspercreme encouraged. Supportive measures discussed. Follow-up if symptoms are not improving.

## 2014-10-28 ENCOUNTER — Encounter: Payer: Self-pay | Admitting: Medical

## 2014-10-28 ENCOUNTER — Ambulatory Visit (INDEPENDENT_AMBULATORY_CARE_PROVIDER_SITE_OTHER): Payer: BLUE CROSS/BLUE SHIELD | Admitting: Medical

## 2014-10-28 VITALS — BP 120/83 | HR 84 | Temp 98.3°F | Ht 64.0 in | Wt 168.6 lb

## 2014-10-28 DIAGNOSIS — T7840XA Allergy, unspecified, initial encounter: Secondary | ICD-10-CM

## 2014-10-28 MED ORDER — PREDNISONE 20 MG PO TABS: ORAL_TABLET | ORAL | Status: DC

## 2014-10-28 MED ORDER — METHYLPREDNISOLONE ACETATE 40 MG/ML IJ SUSP: 40.0000 mg | Freq: Once | INTRAMUSCULAR | Status: DC

## 2014-10-28 MED ORDER — HYDROXYZINE HCL 25 MG PO TABS: 25.0000 mg | ORAL_TABLET | Freq: Three times a day (TID) | ORAL | Status: DC | PRN

## 2014-10-28 NOTE — Patient Instructions (Signed)
Allergic reaction You  have probable allergic reaction to poison ivy. We gave you depo-medrol im injection. I am also prescribing oral prednisone and hydroxyzine for itching. Your rash should gradually improve. If worsening or expanding please notify us.  Follow up in 7 days or as needed.

## 2014-10-28 NOTE — Progress Notes (Signed)
Subjective:    Patient ID: Karen Frazier, female    DOB: 04/19/1981, 34 y.o.   MRN: [id number]  HPI  Pt in reports that she got some exposure to poison ivy. She was working on her farm. 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 Rash came both arms and legs. Some on face. Gradually spreading and itches a lot. She has history of reaction with contract.  LMP- 2 wks ago.    Review of Systems  Constitutional: Negative for fever, chills, diaphoresis, activity change and fatigue.  HENT: Negative for ear pain and sore throat.   Respiratory: Negative for cough, chest tightness and shortness of breath.   Cardiovascular: Negative for chest pain, palpitations and leg swelling.  Gastrointestinal: Negative for nausea, vomiting and abdominal pain.  Musculoskeletal: Negative for neck pain and neck stiffness.  Skin: Positive for rash.       No skin tenderness.  Neurological: Negative for dizziness, tremors, seizures, syncope, facial asymmetry, speech difficulty, weakness, light-headedness, numbness and headaches.  Psychiatric/Behavioral: Negative for behavioral problems, confusion and agitation. The patient is not nervous/anxious.    Past Medical History  Diagnosis Date  . Asthma   . History of chicken pox   . Seasonal allergies   . Environmental allergies   . UTI (lower urinary tract infection)     History   Social History  . Marital Status: Married    Spouse Name: N/A  . Number of Children: N/A  . Years of Education: N/A   Occupational History  . farmer    Social History Main Topics  . Smoking status: Never Smoker   . Smokeless tobacco: Never Used  . Alcohol Use: 0.6 oz/week    1 Standard drinks or equivalent per week  . Drug Use: No  . Sexual Activity:    Partners: Male   Other Topics Concern  . Not on file   Social History Narrative    Past Surgical History  Procedure Laterality Date  . No past surgeries      Family History  Problem Relation Age of Onset  . Hyperlipidemia  Father     Living  . Diabetes Mother     Living  . Hypertension Mother   . Hypertension Father   . Allergy (severe) Mother     Alpha-gal  . Other Mother     IVP Dye  . Heart disease Maternal Grandfather   . COPD Paternal Grandfather   . Healthy Sister     x1  . Allergies Daughter     x1  . Healthy Daughter     x2    Allergies  Allergen Reactions  . Gelatin Anaphylaxis    Nothing Made In Gel [caps]  . Other Anaphylaxis    ALL Mammal Meat Products    Current Outpatient Prescriptions on File Prior to Visit  Medication Sig Dispense Refill  . albuterol (PROVENTIL HFA;VENTOLIN HFA) 108 (90 BASE) MCG/ACT inhaler Inhale 1-2 puffs into the lungs every 6 (six) hours as needed for wheezing or shortness of breath.    . cyclobenzaprine (FLEXERIL) 10 MG tablet Take 1 tablet (10 mg total) by mouth at bedtime. 30 tablet 0  . diclofenac (VOLTAREN) 75 MG EC tablet Take 1 tablet (75 mg total) by mouth 2 (two) times daily. 30 tablet 0  . EPINEPHrine 0.3 mg/0.3 mL IJ SOAJ injection Inject into the muscle once.    . montelukast (SINGULAIR) 10 MG tablet Take 10 mg by mouth daily as needed.     No current  facility-administered medications on file prior to visit.    BP 120/83 mmHg  Pulse 84  Temp(Src) 100.1 F (37.8 C) (Oral)  Ht 5\' 4"  (1.626 m)  Wt 168 lb 9.6 oz (76.476 kg)  BMI 28.93 kg/m2  SpO2 98%  LMP 10/13/2014       Objective:   Physical Exam  General- No acute distress. Pleasant patient. Neck- Full range of motion, no jvd Pharynx- normal Lungs- Clear, even and unlabored. Heart- regular rate and rhythm. Neurologic- CNII- XII grossly intact. Derm- scattered papular rash to both forearms and antecubital area. Some on legs as well. NO obvious rash on face.Skin nontender to palpation.       Assessment & Plan:

## 2014-10-28 NOTE — Progress Notes (Signed)
Pre visit review using our clinic review tool, if applicable. No additional management support is needed unless otherwise documented below in the visit note. 

## 2014-10-28 NOTE — Assessment & Plan Note (Addendum)
You  have probable allergic reaction to poison ivy. We gave you depo-medrol im injection. I am also prescribing oral prednisone and hydroxyzine for itching. Your rash should gradually improve. If worsening or expanding please notify us.  Follow up in 7 days or as needed.

## 2015-07-14 ENCOUNTER — Telehealth: Payer: Self-pay | Admitting: *Deleted

## 2015-07-14 NOTE — Telephone Encounter (Signed)
Unable to reach patient at time of pre-visit call. Left message for patient to return call when available.  

## 2015-07-15 ENCOUNTER — Encounter: Payer: Self-pay | Admitting: Physician Assistant

## 2015-07-15 ENCOUNTER — Ambulatory Visit (INDEPENDENT_AMBULATORY_CARE_PROVIDER_SITE_OTHER): Payer: BLUE CROSS/BLUE SHIELD | Admitting: Physician Assistant

## 2015-07-15 VITALS — BP 108/72 | HR 66 | Temp 98.1°F | Ht 64.0 in | Wt 176.3 lb

## 2015-07-15 DIAGNOSIS — J4520 Mild intermittent asthma, uncomplicated: Secondary | ICD-10-CM

## 2015-07-15 DIAGNOSIS — Z0000 Encounter for general adult medical examination without abnormal findings: Secondary | ICD-10-CM | POA: Diagnosis not present

## 2015-07-15 DIAGNOSIS — Z23 Encounter for immunization: Secondary | ICD-10-CM | POA: Diagnosis not present

## 2015-07-15 LAB — URINALYSIS, ROUTINE W REFLEX MICROSCOPIC
Bilirubin Urine: NEGATIVE
Ketones, ur: NEGATIVE
Leukocytes, UA: NEGATIVE
Nitrite: NEGATIVE
Specific Gravity, Urine: 1.01 (ref 1.000–1.030)
Total Protein, Urine: NEGATIVE
Urine Glucose: NEGATIVE
Urobilinogen, UA: 0.2 (ref 0.0–1.0)
pH: 6 (ref 5.0–8.0)

## 2015-07-15 LAB — COMPREHENSIVE METABOLIC PANEL
ALT: 15 U/L (ref 0–35)
AST: 15 U/L (ref 0–37)
Albumin: 4.3 g/dL (ref 3.5–5.2)
Alkaline Phosphatase: 37 U/L — ABNORMAL LOW (ref 39–117)
BUN: 12 mg/dL (ref 6–23)
CO2: 24 mEq/L (ref 19–32)
Calcium: 8.9 mg/dL (ref 8.4–10.5)
Chloride: 105 mEq/L (ref 96–112)
Creatinine, Ser: 0.81 mg/dL (ref 0.40–1.20)
GFR: 85.58 mL/min (ref >60.00)
Glucose, Bld: 92 mg/dL (ref 70–99)
Potassium: 3.7 mEq/L (ref 3.5–5.1)
Sodium: 136 mEq/L (ref 135–145)
Total Bilirubin: 0.4 mg/dL (ref 0.2–1.2)
Total Protein: 7.1 g/dL (ref 6.0–8.3)

## 2015-07-15 LAB — CBC
HCT: 36.3 % (ref 36.0–46.0)
Hemoglobin: 12 g/dL (ref 12.0–15.0)
MCHC: 33 g/dL (ref 30.0–36.0)
MCV: 92.4 fl (ref 78.0–100.0)
Platelets: 321 10*3/uL (ref 150.0–400.0)
RBC: 3.93 Mil/uL (ref 3.87–5.11)
RDW: 13.6 % (ref 11.5–15.5)
WBC: 6.5 10*3/uL (ref 4.0–10.5)

## 2015-07-15 LAB — LIPID PANEL
Cholesterol: 254 mg/dL — ABNORMAL HIGH (ref 0–200)
HDL: 67.7 mg/dL (ref >39.00)
LDL Cholesterol: 162 mg/dL — ABNORMAL HIGH (ref 0–99)
NonHDL: 185.8
Total CHOL/HDL Ratio: 4
Triglycerides: 120 mg/dL (ref 0.0–149.0)
VLDL: 24 mg/dL (ref 0.0–40.0)

## 2015-07-15 LAB — HEMOGLOBIN A1C: Hgb A1c MFr Bld: 5.3 % (ref 4.6–6.5)

## 2015-07-15 LAB — VITAMIN D 25 HYDROXY (VIT D DEFICIENCY, FRACTURES): VITD: 26.97 ng/mL — ABNORMAL LOW (ref 30.00–100.00)

## 2015-07-15 LAB — TSH: TSH: 1.51 u[IU]/mL (ref 0.35–4.50)

## 2015-07-15 NOTE — Assessment & Plan Note (Signed)
Depression screen negative. Health Maintenance reviewed -- PAP up-to-date. Tetanus up-to-date. Flu shot given today. Preventive schedule discussed and handout given in AVS. Will obtain fasting labs today.

## 2015-07-15 NOTE — Patient Instructions (Signed)
Please go to the lab for blood work.  I will call you with your results. If your blood work is normal we will follow-up yearly for physicals.  If anything is abnormal we will treat and get you in for a follow-up.  Preventive Care for Adults, Female A healthy lifestyle and preventive care can promote health and wellness. Preventive health guidelines for women include the following key practices.  A routine yearly physical is a good way to check with your health care provider about your health and preventive screening. It is a chance to share any concerns and updates on your health and to receive a thorough exam.  Visit your dentist for a routine exam and preventive care every 6 months. Brush your teeth twice a day and floss once a day. Good oral hygiene prevents tooth decay and gum disease.  The frequency of eye exams is based on your age, health, family medical history, use of contact lenses, and other factors. Follow your health care provider's recommendations for frequency of eye exams.  Eat a healthy diet. Foods like vegetables, fruits, whole grains, low-fat dairy products, and lean protein foods contain the nutrients you need without too many calories. Decrease your intake of foods high in solid fats, added sugars, and salt. Eat the right amount of calories for you.Get information about a proper diet from your health care provider, if necessary.  Regular physical exercise is one of the most important things you can do for your health. Most adults should get at least 150 minutes of moderate-intensity exercise (any activity that increases your heart rate and causes you to sweat) each week. In addition, most adults need muscle-strengthening exercises on 2 or more days a week.  Maintain a healthy weight. The body mass index (BMI) is a screening tool to identify possible weight problems. It provides an estimate of body fat based on height and weight. Your health care provider can find your BMI and  can help you achieve or maintain a healthy weight.For adults 20 years and older:  A BMI below 18.5 is considered underweight.  A BMI of 18.5 to 24.9 is normal.  A BMI of 25 to 29.9 is considered overweight.  A BMI of 30 and above is considered obese.  Maintain normal blood lipids and cholesterol levels by exercising and minimizing your intake of saturated fat. Eat a balanced diet with plenty of fruit and vegetables. Blood tests for lipids and cholesterol should begin at age 10 and be repeated every 5 years. If your lipid or cholesterol levels are high, you are over 50, or you are at high risk for heart disease, you may need your cholesterol levels checked more frequently.Ongoing high lipid and cholesterol levels should be treated with medicines if diet and exercise are not working.  If you smoke, find out from your health care provider how to quit. If you do not use tobacco, do not start.  Lung cancer screening is recommended for adults aged 2-80 years who are at high risk for developing lung cancer because of a history of smoking. A yearly low-dose CT scan of the lungs is recommended for people who have at least a 30-pack-year history of smoking and are a current smoker or have quit within the past 15 years. A pack year of smoking is smoking an average of 1 pack of cigarettes a day for 1 year (for example: 1 pack a day for 30 years or 2 packs a day for 15 years). Yearly screening should continue  until the smoker has stopped smoking for at least 15 years. Yearly screening should be stopped for people who develop a health problem that would prevent them from having lung cancer treatment.  If you are pregnant, do not drink alcohol. If you are breastfeeding, be very cautious about drinking alcohol. If you are not pregnant and choose to drink alcohol, do not have more than 1 drink per day. One drink is considered to be 12 ounces (355 mL) of beer, 5 ounces (148 mL) of wine, or 1.5 ounces (44 mL) of  liquor.  Avoid use of street drugs. Do not share needles with anyone. Ask for help if you need support or instructions about stopping the use of drugs.  High blood pressure causes heart disease and increases the risk of stroke. Your blood pressure should be checked at least every 1 to 2 years. Ongoing high blood pressure should be treated with medicines if weight loss and exercise do not work.  If you are 15-23 years old, ask your health care provider if you should take aspirin to prevent strokes.  Diabetes screening is done by taking a blood sample to check your blood glucose level after you have not eaten for a certain period of time (fasting). If you are not overweight and you do not have risk factors for diabetes, you should be screened once every 3 years starting at age 4. If you are overweight or obese and you are 29-58 years of age, you should be screened for diabetes every year as part of your cardiovascular risk assessment.  Breast cancer screening is essential preventive care for women. You should practice "breast self-awareness." This means understanding the normal appearance and feel of your breasts and may include breast self-examination. Any changes detected, no matter how small, should be reported to a health care provider. Women in their 36s and 30s should have a clinical breast exam (CBE) by a health care provider as part of a regular health exam every 1 to 3 years. After age 31, women should have a CBE every year. Starting at age 2, women should consider having a mammogram (breast X-ray test) every year. Women who have a family history of breast cancer should talk to their health care provider about genetic screening. Women at a high risk of breast cancer should talk to their health care providers about having an MRI and a mammogram every year.  Breast cancer gene (BRCA)-related cancer risk assessment is recommended for women who have family members with BRCA-related cancers.  BRCA-related cancers include breast, ovarian, tubal, and peritoneal cancers. Having family members with these cancers may be associated with an increased risk for harmful changes (mutations) in the breast cancer genes BRCA1 and BRCA2. Results of the assessment will determine the need for genetic counseling and BRCA1 and BRCA2 testing.  Your health care provider may recommend that you be screened regularly for cancer of the pelvic organs (ovaries, uterus, and vagina). This screening involves a pelvic examination, including checking for microscopic changes to the surface of your cervix (Pap test). You may be encouraged to have this screening done every 3 years, beginning at age 63.  For women ages 38-65, health care providers may recommend pelvic exams and Pap testing every 3 years, or they may recommend the Pap and pelvic exam, combined with testing for human papilloma virus (HPV), every 5 years. Some types of HPV increase your risk of cervical cancer. Testing for HPV may also be done on women of any age with  unclear Pap test results.  Other health care providers may not recommend any screening for nonpregnant women who are considered low risk for pelvic cancer and who do not have symptoms. Ask your health care provider if a screening pelvic exam is right for you.  If you have had past treatment for cervical cancer or a condition that could lead to cancer, you need Pap tests and screening for cancer for at least 20 years after your treatment. If Pap tests have been discontinued, your risk factors (such as having a new sexual partner) need to be reassessed to determine if screening should resume. Some women have medical problems that increase the chance of getting cervical cancer. In these cases, your health care provider may recommend more frequent screening and Pap tests.  Colorectal cancer can be detected and often prevented. Most routine colorectal cancer screening begins at the age of 83 years and  continues through age 29 years. However, your health care provider may recommend screening at an earlier age if you have risk factors for colon cancer. On a yearly basis, your health care provider may provide home test kits to check for hidden blood in the stool. Use of a small camera at the end of a tube, to directly examine the colon (sigmoidoscopy or colonoscopy), can detect the earliest forms of colorectal cancer. Talk to your health care provider about this at age 67, when routine screening begins. Direct exam of the colon should be repeated every 5-10 years through age 12 years, unless early forms of precancerous polyps or small growths are found.  People who are at an increased risk for hepatitis B should be screened for this virus. You are considered at high risk for hepatitis B if:  You were born in a country where hepatitis B occurs often. Talk with your health care provider about which countries are considered high risk.  Your parents were born in a high-risk country and you have not received a shot to protect against hepatitis B (hepatitis B vaccine).  You have HIV or AIDS.  You use needles to inject street drugs.  You live with, or have sex with, someone who has hepatitis B.  You get hemodialysis treatment.  You take certain medicines for conditions like cancer, organ transplantation, and autoimmune conditions.  Hepatitis C blood testing is recommended for all people born from [birth year] through 1965 and any individual with known risks for hepatitis C.  Practice safe sex. Use condoms and avoid high-risk sexual practices to reduce the spread of sexually transmitted infections (STIs). STIs include gonorrhea, chlamydia, syphilis, trichomonas, herpes, HPV, and human immunodeficiency virus (HIV). Herpes, HIV, and HPV are viral illnesses that have no cure. They can result in disability, cancer, and death.  You should be screened for sexually transmitted illnesses (STIs) including gonorrhea  and chlamydia if:  You are sexually active and are younger than 24 years.  You are older than 24 years and your health care provider tells you that you are at risk for this type of infection.  Your sexual activity has changed since you were last screened and you are at an increased risk for chlamydia or gonorrhea. Ask your health care provider if you are at risk.  If you are at risk of being infected with HIV, it is recommended that you take a prescription medicine daily to prevent HIV infection. This is called preexposure prophylaxis (PrEP). You are considered at risk if:  You are sexually active and do not regularly use condoms or  know the HIV status of your partner(s).  You take drugs by injection.  You are sexually active with a partner who has HIV.  Talk with your health care provider about whether you are at high risk of being infected with HIV. If you choose to begin PrEP, you should first be tested for HIV. You should then be tested every 3 months for as long as you are taking PrEP.  Osteoporosis is a disease in which the bones lose minerals and strength with aging. This can result in serious bone fractures or breaks. The risk of osteoporosis can be identified using a bone density scan. Women ages 31 years and over and women at risk for fractures or osteoporosis should discuss screening with their health care providers. Ask your health care provider whether you should take a calcium supplement or vitamin D to reduce the rate of osteoporosis.  Menopause can be associated with physical symptoms and risks. Hormone replacement therapy is available to decrease symptoms and risks. You should talk to your health care provider about whether hormone replacement therapy is right for you.  Use sunscreen. Apply sunscreen liberally and repeatedly throughout the day. You should seek shade when your shadow is shorter than you. Protect yourself by wearing long sleeves, pants, a wide-brimmed hat, and  sunglasses year round, whenever you are outdoors.  Once a month, do a whole body skin exam, using a mirror to look at the skin on your back. Tell your health care provider of new moles, moles that have irregular borders, moles that are larger than a pencil eraser, or moles that have changed in shape or color.  Stay current with required vaccines (immunizations).  Influenza vaccine. All adults should be immunized every year.  Tetanus, diphtheria, and acellular pertussis (Td, Tdap) vaccine. Pregnant women should receive 1 dose of Tdap vaccine during each pregnancy. The dose should be obtained regardless of the length of time since the last dose. Immunization is preferred during the 27th-36th week of gestation. An adult who has not previously received Tdap or who does not know her vaccine status should receive 1 dose of Tdap. This initial dose should be followed by tetanus and diphtheria toxoids (Td) booster doses every 10 years. Adults with an unknown or incomplete history of completing a 3-dose immunization series with Td-containing vaccines should begin or complete a primary immunization series including a Tdap dose. Adults should receive a Td booster every 10 years.  Varicella vaccine. An adult without evidence of immunity to varicella should receive 2 doses or a second dose if she has previously received 1 dose. Pregnant females who do not have evidence of immunity should receive the first dose after pregnancy. This first dose should be obtained before leaving the health care facility. The second dose should be obtained 4-8 weeks after the first dose.  Human papillomavirus (HPV) vaccine. Females aged 13-26 years who have not received the vaccine previously should obtain the 3-dose series. The vaccine is not recommended for use in pregnant females. However, pregnancy testing is not needed before receiving a dose. If a female is found to be pregnant after receiving a dose, no treatment is needed. In that  case, the remaining doses should be delayed until after the pregnancy. Immunization is recommended for any person with an immunocompromised condition through the age of 66 years if she did not get any or all doses earlier. During the 3-dose series, the second dose should be obtained 4-8 weeks after the first dose. The third dose  should be obtained 24 weeks after the first dose and 16 weeks after the second dose.  Zoster vaccine. One dose is recommended for adults aged 83 years or older unless certain conditions are present.  Measles, mumps, and rubella (MMR) vaccine. Adults born before [birth year] generally are considered immune to measles and mumps. Adults born in [birth year] or later should have 1 or more doses of MMR vaccine unless there is a contraindication to the vaccine or there is laboratory evidence of immunity to each of the three diseases. A routine second dose of MMR vaccine should be obtained at least 28 days after the first dose for students attending postsecondary schools, health care workers, or international travelers. People who received inactivated measles vaccine or an unknown type of measles vaccine during 1963-1967 should receive 2 doses of MMR vaccine. People who received inactivated mumps vaccine or an unknown type of mumps vaccine before 1979 and are at high risk for mumps infection should consider immunization with 2 doses of MMR vaccine. For females of childbearing age, rubella immunity should be determined. If there is no evidence of immunity, females who are not pregnant should be vaccinated. If there is no evidence of immunity, females who are pregnant should delay immunization until after pregnancy. Unvaccinated health care workers born before [birth year] who lack laboratory evidence of measles, mumps, or rubella immunity or laboratory confirmation of disease should consider measles and mumps immunization with 2 doses of MMR vaccine or rubella immunization with 1 dose of MMR vaccine.  Pneumococcal  13-valent conjugate (PCV13) vaccine. When indicated, a person who is uncertain of his immunization history and has no record of immunization should receive the PCV13 vaccine. All adults 71 years of age and older should receive this vaccine. An adult aged 100 years or older who has certain medical conditions and has not been previously immunized should receive 1 dose of PCV13 vaccine. This PCV13 should be followed with a dose of pneumococcal polysaccharide (PPSV23) vaccine. Adults who are at high risk for pneumococcal disease should obtain the PPSV23 vaccine at least 8 weeks after the dose of PCV13 vaccine. Adults older than 35 years of age who have normal immune system function should obtain the PPSV23 vaccine dose at least 1 year after the dose of PCV13 vaccine.  Pneumococcal polysaccharide (PPSV23) vaccine. When PCV13 is also indicated, PCV13 should be obtained first. All adults aged 28 years and older should be immunized. An adult younger than age 57 years who has certain medical conditions should be immunized. Any person who resides in a nursing home or long-term care facility should be immunized. An adult smoker should be immunized. People with an immunocompromised condition and certain other conditions should receive both PCV13 and PPSV23 vaccines. People with human immunodeficiency virus (HIV) infection should be immunized as soon as possible after diagnosis. Immunization during chemotherapy or radiation therapy should be avoided. Routine use of PPSV23 vaccine is not recommended for American Indians, [place] Natives, or people younger than 65 years unless there are medical conditions that require PPSV23 vaccine. When indicated, people who have unknown immunization and have no record of immunization should receive PPSV23 vaccine. One-time revaccination 5 years after the first dose of PPSV23 is recommended for people aged 19-64 years who have chronic kidney failure, nephrotic syndrome, asplenia, or  immunocompromised conditions. People who received 1-2 doses of PPSV23 before age 71 years should receive another dose of PPSV23 vaccine at age 57 years or later if at least 5 years have passed since the previous  dose. Doses of PPSV23 are not needed for people immunized with PPSV23 at or after age 12 years.  Meningococcal vaccine. Adults with asplenia or persistent complement component deficiencies should receive 2 doses of quadrivalent meningococcal conjugate (MenACWY-D) vaccine. The doses should be obtained at least 2 months apart. Microbiologists working with certain meningococcal bacteria, [employer] recruits, people at risk during an outbreak, and people who travel to or live in countries with a high rate of meningitis should be immunized. A first-year college student up through age 75 years who is living in a residence hall should receive a dose if she did not receive a dose on or after her 16th birthday. Adults who have certain high-risk conditions should receive one or more doses of vaccine.  Hepatitis A vaccine. Adults who wish to be protected from this disease, have certain high-risk conditions, work with hepatitis A-infected animals, work in hepatitis A research labs, or travel to or work in countries with a high rate of hepatitis A should be immunized. Adults who were previously unvaccinated and who anticipate close contact with an international adoptee during the first 60 days after arrival in the [place] from a country with a high rate of hepatitis A should be immunized.  Hepatitis B vaccine. Adults who wish to be protected from this disease, have certain high-risk conditions, may be exposed to blood or other infectious body fluids, are household contacts or sex partners of hepatitis B positive people, are clients or workers in certain care facilities, or travel to or work in countries with a high rate of hepatitis B should be immunized.  Haemophilus influenzae type b (Hib) vaccine. A  previously unvaccinated person with asplenia or sickle cell disease or having a scheduled splenectomy should receive 1 dose of Hib vaccine. Regardless of previous immunization, a recipient of a hematopoietic stem cell transplant should receive a 3-dose series 6-12 months after her successful transplant. Hib vaccine is not recommended for adults with HIV infection. Preventive Services / Frequency Ages 58 to 42 years  Blood pressure check.** / Every 3-5 years.  Lipid and cholesterol check.** / Every 5 years beginning at age 18.  Clinical breast exam.** / Every 3 years for women in their 48s and 33s.  BRCA-related cancer risk assessment.** / For women who have family members with a BRCA-related cancer (breast, ovarian, tubal, or peritoneal cancers).  Pap test.** / Every 2 years from ages 97 through 74. Every 3 years starting at age 71 through age 96 or 9 with a history of 3 consecutive normal Pap tests.  HPV screening.** / Every 3 years from ages 70 through ages 60 to 59 with a history of 3 consecutive normal Pap tests.  Hepatitis C blood test.** / For any individual with known risks for hepatitis C.  Skin self-exam. / Monthly.  Influenza vaccine. / Every year.  Tetanus, diphtheria, and acellular pertussis (Tdap, Td) vaccine.** / Consult your health care provider. Pregnant women should receive 1 dose of Tdap vaccine during each pregnancy. 1 dose of Td every 10 years.  Varicella vaccine.** / Consult your health care provider. Pregnant females who do not have evidence of immunity should receive the first dose after pregnancy.  HPV vaccine. / 3 doses over 6 months, if 33 and younger. The vaccine is not recommended for use in pregnant females. However, pregnancy testing is not needed before receiving a dose.  Measles, mumps, rubella (MMR) vaccine.** / You need at least 1 dose of MMR if you were born in 1957  or later. You may also need a 2nd dose. For females of childbearing age, rubella  immunity should be determined. If there is no evidence of immunity, females who are not pregnant should be vaccinated. If there is no evidence of immunity, females who are pregnant should delay immunization until after pregnancy.  Pneumococcal 13-valent conjugate (PCV13) vaccine.** / Consult your health care provider.  Pneumococcal polysaccharide (PPSV23) vaccine.** / 1 to 2 doses if you smoke cigarettes or if you have certain conditions.  Meningococcal vaccine.** / 1 dose if you are age 24 to 56 years and a Market researcher living in a residence hall, or have one of several medical conditions, you need to get vaccinated against meningococcal disease. You may also need additional booster doses.  Hepatitis A vaccine.** / Consult your health care provider.  Hepatitis B vaccine.** / Consult your health care provider.  Haemophilus influenzae type b (Hib) vaccine.** / Consult your health care provider. Ages 60 to 58 years  Blood pressure check.** / Every year.  Lipid and cholesterol check.** / Every 5 years beginning at age 55 years.  Lung cancer screening. / Every year if you are aged 9-80 years and have a 30-pack-year history of smoking and currently smoke or have quit within the past 15 years. Yearly screening is stopped once you have quit smoking for at least 15 years or develop a health problem that would prevent you from having lung cancer treatment.  Clinical breast exam.** / Every year after age 27 years.  BRCA-related cancer risk assessment.** / For women who have family members with a BRCA-related cancer (breast, ovarian, tubal, or peritoneal cancers).  Mammogram.** / Every year beginning at age 97 years and continuing for as long as you are in good health. Consult with your health care provider.  Pap test.** / Every 3 years starting at age 35 years through age 82 or 21 years with a history of 3 consecutive normal Pap tests.  HPV screening.** / Every 3 years from ages 69  years through ages 60 to 18 years with a history of 3 consecutive normal Pap tests.  Fecal occult blood test (FOBT) of stool. / Every year beginning at age 29 years and continuing until age 2 years. You may not need to do this test if you get a colonoscopy every 10 years.  Flexible sigmoidoscopy or colonoscopy.** / Every 5 years for a flexible sigmoidoscopy or every 10 years for a colonoscopy beginning at age 43 years and continuing until age 97 years.  Hepatitis C blood test.** / For all people born from [birth year] through 1965 and any individual with known risks for hepatitis C.  Skin self-exam. / Monthly.  Influenza vaccine. / Every year.  Tetanus, diphtheria, and acellular pertussis (Tdap/Td) vaccine.** / Consult your health care provider. Pregnant women should receive 1 dose of Tdap vaccine during each pregnancy. 1 dose of Td every 10 years.  Varicella vaccine.** / Consult your health care provider. Pregnant females who do not have evidence of immunity should receive the first dose after pregnancy.  Zoster vaccine.** / 1 dose for adults aged 30 years or older.  Measles, mumps, rubella (MMR) vaccine.** / You need at least 1 dose of MMR if you were born in 1957 or later. You may also need a second dose. For females of childbearing age, rubella immunity should be determined. If there is no evidence of immunity, females who are not pregnant should be vaccinated. If there is no evidence of immunity, females who  are pregnant should delay immunization until after pregnancy.  Pneumococcal 13-valent conjugate (PCV13) vaccine.** / Consult your health care provider.  Pneumococcal polysaccharide (PPSV23) vaccine.** / 1 to 2 doses if you smoke cigarettes or if you have certain conditions.  Meningococcal vaccine.** / Consult your health care provider.  Hepatitis A vaccine.** / Consult your health care provider.  Hepatitis B vaccine.** / Consult your health care provider.  Haemophilus influenzae type  b (Hib) vaccine.** / Consult your health care provider. Ages 46 years and over  Blood pressure check.** / Every year.  Lipid and cholesterol check.** / Every 5 years beginning at age 69 years.  Lung cancer screening. / Every year if you are aged 50-80 years and have a 30-pack-year history of smoking and currently smoke or have quit within the past 15 years. Yearly screening is stopped once you have quit smoking for at least 15 years or develop a health problem that would prevent you from having lung cancer treatment.  Clinical breast exam.** / Every year after age 54 years.  BRCA-related cancer risk assessment.** / For women who have family members with a BRCA-related cancer (breast, ovarian, tubal, or peritoneal cancers).  Mammogram.** / Every year beginning at age 52 years and continuing for as long as you are in good health. Consult with your health care provider.  Pap test.** / Every 3 years starting at age 58 years through age 52 or 32 years with 3 consecutive normal Pap tests. Testing can be stopped between 65 and 70 years with 3 consecutive normal Pap tests and no abnormal Pap or HPV tests in the past 10 years.  HPV screening.** / Every 3 years from ages 57 years through ages 16 or 37 years with a history of 3 consecutive normal Pap tests. Testing can be stopped between 65 and 70 years with 3 consecutive normal Pap tests and no abnormal Pap or HPV tests in the past 10 years.  Fecal occult blood test (FOBT) of stool. / Every year beginning at age 41 years and continuing until age 62 years. You may not need to do this test if you get a colonoscopy every 10 years.  Flexible sigmoidoscopy or colonoscopy.** / Every 5 years for a flexible sigmoidoscopy or every 10 years for a colonoscopy beginning at age 66 years and continuing until age 40 years.  Hepatitis C blood test.** / For all people born from [birth year] through 1965 and any individual with known risks for hepatitis C.  Osteoporosis  screening.** / A one-time screening for women ages 79 years and over and women at risk for fractures or osteoporosis.  Skin self-exam. / Monthly.  Influenza vaccine. / Every year.  Tetanus, diphtheria, and acellular pertussis (Tdap/Td) vaccine.** / 1 dose of Td every 10 years.  Varicella vaccine.** / Consult your health care provider.  Zoster vaccine.** / 1 dose for adults aged 85 years or older.  Pneumococcal 13-valent conjugate (PCV13) vaccine.** / Consult your health care provider.  Pneumococcal polysaccharide (PPSV23) vaccine.** / 1 dose for all adults aged 25 years and older.  Meningococcal vaccine.** / Consult your health care provider.  Hepatitis A vaccine.** / Consult your health care provider.  Hepatitis B vaccine.** / Consult your health care provider.  Haemophilus influenzae type b (Hib) vaccine.** / Consult your health care provider. ** Family history and personal history of risk and conditions may change your health care provider's recommendations.   This information is not intended to replace advice given to you by your health care  provider. Make sure you discuss any questions you have with your health care provider.   Document Released: 07/31/2001 Document Revised: 06/25/2014 Document Reviewed: 10/30/2010 Elsevier Interactive Patient Education [employer].

## 2015-07-15 NOTE — Progress Notes (Signed)
Pre visit review using our clinic review tool, if applicable. No additional management support is needed unless otherwise documented below in the visit note. 

## 2015-07-15 NOTE — Progress Notes (Signed)
Patient presents to clinic today for annual exam.  Patient is fasting for labs. Is requesting flu shot.  Acute Concerns: Denies acute concerns today. Endorses well-balanced diet and daily exercise. Body mass index is 30.25 kg/(m^2).  Chronic Issues: Allergic Asthma -- doing very well on Singulair daily. Has only needed Albuterol once in the past year with a cold.   Health Maintenance: Dental -- up-to-date Immunizations -- Tetanus up-to-date. Will get flu shot today. PAP -- up-to-date per patient 04/2015. Is followed by [facility]   Past Medical History  Diagnosis Date  . Asthma   . History of chicken pox   . Seasonal allergies   . Environmental allergies   . UTI (lower urinary tract infection)     Past Surgical History  Procedure Laterality Date  . No past surgeries      Current Outpatient Prescriptions on File Prior to Visit  Medication Sig Dispense Refill  . albuterol (PROVENTIL HFA;VENTOLIN HFA) 108 (90 BASE) MCG/ACT inhaler Inhale 1-2 puffs into the lungs every 6 (six) hours as needed for wheezing or shortness of breath.    . montelukast (SINGULAIR) 10 MG tablet Take 10 mg by mouth daily as needed.    Marland Kitchen EPINEPHrine 0.3 mg/0.3 mL IJ SOAJ injection Inject into the muscle once. Reported on 07/15/2015     No current facility-administered medications on file prior to visit.    Allergies  Allergen Reactions  . Gelatin Anaphylaxis    Nothing Made In Gel [caps]  . Other Anaphylaxis    ALL Mammal Meat Products    Family History  Problem Relation Age of Onset  . Hyperlipidemia Father     Living  . Diabetes Mother     Living  . Hypertension Mother   . Hypertension Father   . Allergy (severe) Mother     Alpha-gal  . Other Mother     IVP Dye  . Heart disease Maternal Grandfather   . COPD Paternal Grandfather   . Healthy Sister     x1  . Allergies Daughter     x1  . Healthy Daughter     x2    Social History   Social History  . Marital Status:  Married    Spouse Name: N/A  . Number of Children: N/A  . Years of Education: N/A   Occupational History  . farmer    Social History Main Topics  . Smoking status: Never Smoker   . Smokeless tobacco: Never Used  . Alcohol Use: 0.6 oz/week    1 Standard drinks or equivalent per week  . Drug Use: No  . Sexual Activity:    Partners: Male   Other Topics Concern  . Not on file   Social History Narrative   Review of Systems  Constitutional: Negative for fever and weight loss.  HENT: Negative for ear discharge, ear pain, hearing loss and tinnitus.   Eyes: Negative for blurred vision, double vision, photophobia and pain.  Respiratory: Negative for cough and shortness of breath.   Cardiovascular: Negative for chest pain and palpitations.  Gastrointestinal: Negative for heartburn, nausea, vomiting, abdominal pain, diarrhea, constipation, blood in stool and melena.  Genitourinary: Negative for dysuria, urgency, frequency, hematuria and flank pain.  Musculoskeletal: Negative for falls.  Neurological: Negative for dizziness, loss of consciousness and headaches.  Endo/Heme/Allergies: Negative for environmental allergies.  Psychiatric/Behavioral: Negative for depression, suicidal ideas, hallucinations and substance abuse. The patient is not nervous/anxious and does not have insomnia.  BP 108/72 mmHg  Pulse 66  Temp(Src) 98.1 F (36.7 C) (Oral)  Ht  (1.626 m)  Wt 176 lb 5 oz (79.975 kg)  BMI 30.25 kg/m2  SpO2 99%  Physical Exam  Constitutional: She is oriented to person, place, and time and well-developed, well-nourished, and in no distress.  HENT:  Head: Normocephalic and atraumatic.  Right Ear: Tympanic membrane, external ear and ear canal normal.  Left Ear: Tympanic membrane, external ear and ear canal normal.  Nose: Nose normal. No mucosal edema.  Mouth/Throat: Uvula is midline, oropharynx is clear and moist and mucous membranes are normal. No oropharyngeal exudate or  posterior oropharyngeal erythema.  Eyes: Conjunctivae are normal. Pupils are equal, round, and reactive to light.  Neck: Neck supple. No thyromegaly present.  Cardiovascular: Normal rate, regular rhythm, normal heart sounds and intact distal pulses.   Pulmonary/Chest: Effort normal and breath sounds normal. No respiratory distress. She has no wheezes. She has no rales.  Abdominal: Soft. Bowel sounds are normal. She exhibits no distension and no mass. There is no tenderness. There is no rebound and no guarding.  Lymphadenopathy:    She has no cervical adenopathy.  Neurological: She is alert and oriented to person, place, and time. No cranial nerve deficit.  Skin: Skin is warm and dry. No rash noted.  Psychiatric: Affect normal.  Vitals reviewed.  No results found for this or any previous visit (from the past 2160 hour(s)).  Assessment/Plan: Visit for preventive health examination Depression screen negative. Health Maintenance reviewed -- PAP up-to-date. Tetanus up-to-date. Flu shot given today. Preventive schedule discussed and handout given in AVS. Will obtain fasting labs today.

## 2015-09-20 DIAGNOSIS — J301 Allergic rhinitis due to pollen: Secondary | ICD-10-CM | POA: Diagnosis not present

## 2015-09-20 DIAGNOSIS — J3089 Other allergic rhinitis: Secondary | ICD-10-CM | POA: Diagnosis not present

## 2015-09-20 DIAGNOSIS — J3081 Allergic rhinitis due to animal (cat) (dog) hair and dander: Secondary | ICD-10-CM | POA: Diagnosis not present

## 2015-09-27 DIAGNOSIS — J3081 Allergic rhinitis due to animal (cat) (dog) hair and dander: Secondary | ICD-10-CM | POA: Diagnosis not present

## 2015-09-27 DIAGNOSIS — J301 Allergic rhinitis due to pollen: Secondary | ICD-10-CM | POA: Diagnosis not present

## 2015-09-27 DIAGNOSIS — J3089 Other allergic rhinitis: Secondary | ICD-10-CM | POA: Diagnosis not present

## 2015-10-04 DIAGNOSIS — J301 Allergic rhinitis due to pollen: Secondary | ICD-10-CM | POA: Diagnosis not present

## 2015-10-04 DIAGNOSIS — J3081 Allergic rhinitis due to animal (cat) (dog) hair and dander: Secondary | ICD-10-CM | POA: Diagnosis not present

## 2015-10-04 DIAGNOSIS — J3089 Other allergic rhinitis: Secondary | ICD-10-CM | POA: Diagnosis not present

## 2015-10-18 DIAGNOSIS — J3081 Allergic rhinitis due to animal (cat) (dog) hair and dander: Secondary | ICD-10-CM | POA: Diagnosis not present

## 2015-10-18 DIAGNOSIS — J301 Allergic rhinitis due to pollen: Secondary | ICD-10-CM | POA: Diagnosis not present

## 2015-10-18 DIAGNOSIS — J3089 Other allergic rhinitis: Secondary | ICD-10-CM | POA: Diagnosis not present

## 2015-10-25 DIAGNOSIS — J301 Allergic rhinitis due to pollen: Secondary | ICD-10-CM | POA: Diagnosis not present

## 2015-10-25 DIAGNOSIS — J3081 Allergic rhinitis due to animal (cat) (dog) hair and dander: Secondary | ICD-10-CM | POA: Diagnosis not present

## 2015-10-25 DIAGNOSIS — J3089 Other allergic rhinitis: Secondary | ICD-10-CM | POA: Diagnosis not present

## 2015-11-01 DIAGNOSIS — J301 Allergic rhinitis due to pollen: Secondary | ICD-10-CM | POA: Diagnosis not present

## 2015-11-01 DIAGNOSIS — J3089 Other allergic rhinitis: Secondary | ICD-10-CM | POA: Diagnosis not present

## 2015-11-01 DIAGNOSIS — J3081 Allergic rhinitis due to animal (cat) (dog) hair and dander: Secondary | ICD-10-CM | POA: Diagnosis not present

## 2015-11-08 DIAGNOSIS — J301 Allergic rhinitis due to pollen: Secondary | ICD-10-CM | POA: Diagnosis not present

## 2015-11-08 DIAGNOSIS — J3081 Allergic rhinitis due to animal (cat) (dog) hair and dander: Secondary | ICD-10-CM | POA: Diagnosis not present

## 2015-11-08 DIAGNOSIS — J3089 Other allergic rhinitis: Secondary | ICD-10-CM | POA: Diagnosis not present

## 2015-11-17 DIAGNOSIS — J3089 Other allergic rhinitis: Secondary | ICD-10-CM | POA: Diagnosis not present

## 2015-11-17 DIAGNOSIS — J301 Allergic rhinitis due to pollen: Secondary | ICD-10-CM | POA: Diagnosis not present

## 2015-11-17 DIAGNOSIS — J3081 Allergic rhinitis due to animal (cat) (dog) hair and dander: Secondary | ICD-10-CM | POA: Diagnosis not present

## 2015-11-22 DIAGNOSIS — J301 Allergic rhinitis due to pollen: Secondary | ICD-10-CM | POA: Diagnosis not present

## 2015-11-22 DIAGNOSIS — J3081 Allergic rhinitis due to animal (cat) (dog) hair and dander: Secondary | ICD-10-CM | POA: Diagnosis not present

## 2015-11-22 DIAGNOSIS — J3089 Other allergic rhinitis: Secondary | ICD-10-CM | POA: Diagnosis not present

## 2015-12-06 DIAGNOSIS — J3089 Other allergic rhinitis: Secondary | ICD-10-CM | POA: Diagnosis not present

## 2015-12-06 DIAGNOSIS — J301 Allergic rhinitis due to pollen: Secondary | ICD-10-CM | POA: Diagnosis not present

## 2015-12-06 DIAGNOSIS — J3081 Allergic rhinitis due to animal (cat) (dog) hair and dander: Secondary | ICD-10-CM | POA: Diagnosis not present

## 2015-12-09 ENCOUNTER — Encounter: Payer: Self-pay | Admitting: Physician Assistant

## 2015-12-09 ENCOUNTER — Ambulatory Visit (INDEPENDENT_AMBULATORY_CARE_PROVIDER_SITE_OTHER): Payer: BLUE CROSS/BLUE SHIELD | Admitting: Physician Assistant

## 2015-12-09 VITALS — BP 112/70 | HR 73 | Temp 98.2°F | Resp 16 | Ht 64.0 in | Wt 172.5 lb

## 2015-12-09 DIAGNOSIS — R2240 Localized swelling, mass and lump, unspecified lower limb: Secondary | ICD-10-CM

## 2015-12-09 DIAGNOSIS — K5900 Constipation, unspecified: Secondary | ICD-10-CM | POA: Diagnosis not present

## 2015-12-09 DIAGNOSIS — J3081 Allergic rhinitis due to animal (cat) (dog) hair and dander: Secondary | ICD-10-CM | POA: Diagnosis not present

## 2015-12-09 DIAGNOSIS — J301 Allergic rhinitis due to pollen: Secondary | ICD-10-CM | POA: Diagnosis not present

## 2015-12-09 LAB — T4, FREE: Free T4: 0.71 ng/dL (ref 0.60–1.60)

## 2015-12-09 LAB — TSH: TSH: 1.79 u[IU]/mL (ref 0.35–4.50)

## 2015-12-09 MED ORDER — LUBIPROSTONE 8 MCG PO CAPS: 8.0000 ug | ORAL_CAPSULE | Freq: Two times a day (BID) | ORAL | Status: DC

## 2015-12-09 NOTE — Patient Instructions (Addendum)
Please go to the lab for blood work. I will call with results.  You will be contacted by Podiatry for assessment of your foot mass.  Start the Amitiza free trial, taking as directed. If you are tolerating well I will send in a full script. Stay hydrated and keep up with your probiotic and fiber supplement.

## 2015-12-09 NOTE — Progress Notes (Signed)
Pre visit review using our clinic review tool, if applicable. No additional management support is needed unless otherwise documented below in the visit note/SLS  

## 2015-12-11 NOTE — Progress Notes (Signed)
Patient presents to clinic today with multiple complaints.  Patient c/o constipation over the past several months. Endorses 1-2 bowel movements per week. Denies tenesmus. Denies nausea, vomiting, heart burn. Stays active. Is not currently on any pain medications. States she has been this way intermittently for most of her life.   Patient also notes a know on the plantar surface of her left foot, first noticed 6 months ago. Endorses the knot has grown in size since that time. Is painful with ambulation only. Denies trauma or injury to the foot.  Past Medical History  Diagnosis Date  . Asthma   . History of chicken pox   . Seasonal allergies   . Environmental allergies   . UTI (lower urinary tract infection)     Current Outpatient Prescriptions on File Prior to Visit  Medication Sig Dispense Refill  . albuterol (PROVENTIL HFA;VENTOLIN HFA) 108 (90 BASE) MCG/ACT inhaler Inhale 1-2 puffs into the lungs every 6 (six) hours as needed for wheezing or shortness of breath.    . EPINEPHrine 0.3 mg/0.3 mL IJ SOAJ injection Inject into the muscle once. Reported on 07/15/2015    . montelukast (SINGULAIR) 10 MG tablet Take 10 mg by mouth daily as needed.     No current facility-administered medications on file prior to visit.    Allergies  Allergen Reactions  . Gelatin Anaphylaxis    Nothing Made In Gel [caps]  . Other Anaphylaxis    ALL Mammal Meat Products    Family History  Problem Relation Age of Onset  . Hyperlipidemia Father     Living  . Diabetes Mother     Living  . Hypertension Mother   . Hypertension Father   . Allergy (severe) Mother     Alpha-gal  . Other Mother     IVP Dye  . Heart disease Maternal Grandfather   . COPD Paternal Grandfather   . Healthy Sister     x1  . Allergies Daughter     x1  . Healthy Daughter     x2    Social History   Social History  . Marital Status: Married    Spouse Name: N/A  . Number of Children: N/A  . Years of Education: N/A    Occupational History  . farmer    Social History Main Topics  . Smoking status: Never Smoker   . Smokeless tobacco: Never Used  . Alcohol Use: 0.6 oz/week    1 Standard drinks or equivalent per week  . Drug Use: No  . Sexual Activity:    Partners: Male   Other Topics Concern  . None   Social History Narrative    Review of Systems - See HPI.  All other ROS are negative.  BP 112/70 mmHg  Pulse 73  Temp(Src) 98.2 F (36.8 C) (Oral)  Resp 16  Ht 5\' 4"  (1.626 m)  Wt 172 lb 8 oz (78.245 kg)  BMI 29.59 kg/m2  SpO2 99%  LMP 12/07/2015  Physical Exam  Constitutional: She is oriented to person, place, and time and well-developed, well-nourished, and in no distress.  HENT:  Head: Normocephalic and atraumatic.  Eyes: Conjunctivae are normal.  Cardiovascular: Normal rate, regular rhythm, normal heart sounds and intact distal pulses.   Pulmonary/Chest: Effort normal and breath sounds normal. No respiratory distress. She has no wheezes. She has no rales. She exhibits no tenderness.  Abdominal: Soft. Bowel sounds are normal. She exhibits no distension and no mass. There is no tenderness.  There is no rebound and no guarding.  Musculoskeletal:       Feet:  Neurological: She is alert and oriented to person, place, and time.  Skin: Skin is warm and dry. No rash noted.  Psychiatric: Affect normal.  Vitals reviewed.   Recent Results (from the past 2160 hour(s))  TSH     Status: None   Collection Time: 12/09/15 10:50 AM  Result Value Ref Range   TSH 1.79 0.35 - 4.50 uIU/mL  T4, free     Status: None   Collection Time: 12/09/15 10:50 AM  Result Value Ref Range   Free T4 0.71 0.60 - 1.60 ng/dL    Assessment/Plan: 1. Constipation, unspecified constipation type Will check thyroid panel. Supportive measures and diet reviewed. Will begin trial or Amitiza 8 mcg daily. Will titrate dose based on results. - TSH - T4, free  2. Mass of foot, unspecified laterality Cyst?. Referral  to Podiatry placed for further assessment and management. Supportive footwear recommended. Pain medication reviewed. - Ambulatory referral to Podiatry   Piedad ClimesMartin, Kentavius Dettore Cody, PA-C

## 2015-12-12 DIAGNOSIS — J3089 Other allergic rhinitis: Secondary | ICD-10-CM | POA: Diagnosis not present

## 2015-12-22 DIAGNOSIS — J3081 Allergic rhinitis due to animal (cat) (dog) hair and dander: Secondary | ICD-10-CM | POA: Diagnosis not present

## 2015-12-22 DIAGNOSIS — J3089 Other allergic rhinitis: Secondary | ICD-10-CM | POA: Diagnosis not present

## 2015-12-22 DIAGNOSIS — J301 Allergic rhinitis due to pollen: Secondary | ICD-10-CM | POA: Diagnosis not present

## 2015-12-27 DIAGNOSIS — J3089 Other allergic rhinitis: Secondary | ICD-10-CM | POA: Diagnosis not present

## 2015-12-27 DIAGNOSIS — J301 Allergic rhinitis due to pollen: Secondary | ICD-10-CM | POA: Diagnosis not present

## 2015-12-27 DIAGNOSIS — J3081 Allergic rhinitis due to animal (cat) (dog) hair and dander: Secondary | ICD-10-CM | POA: Diagnosis not present

## 2016-01-05 DIAGNOSIS — J3081 Allergic rhinitis due to animal (cat) (dog) hair and dander: Secondary | ICD-10-CM | POA: Diagnosis not present

## 2016-01-05 DIAGNOSIS — J3089 Other allergic rhinitis: Secondary | ICD-10-CM | POA: Diagnosis not present

## 2016-01-05 DIAGNOSIS — J301 Allergic rhinitis due to pollen: Secondary | ICD-10-CM | POA: Diagnosis not present

## 2016-01-10 DIAGNOSIS — J301 Allergic rhinitis due to pollen: Secondary | ICD-10-CM | POA: Diagnosis not present

## 2016-01-10 DIAGNOSIS — J3081 Allergic rhinitis due to animal (cat) (dog) hair and dander: Secondary | ICD-10-CM | POA: Diagnosis not present

## 2016-01-10 DIAGNOSIS — J3089 Other allergic rhinitis: Secondary | ICD-10-CM | POA: Diagnosis not present

## 2016-01-11 ENCOUNTER — Encounter: Payer: Self-pay | Admitting: Podiatry

## 2016-01-11 ENCOUNTER — Ambulatory Visit (INDEPENDENT_AMBULATORY_CARE_PROVIDER_SITE_OTHER): Payer: BLUE CROSS/BLUE SHIELD | Admitting: Podiatry

## 2016-01-11 ENCOUNTER — Ambulatory Visit (HOSPITAL_BASED_OUTPATIENT_CLINIC_OR_DEPARTMENT_OTHER)
Admission: RE | Admit: 2016-01-11 | Discharge: 2016-01-11 | Disposition: A | Discharge status: Home / Self Care | Payer: BLUE CROSS/BLUE SHIELD | Source: Ambulatory Visit | Attending: Podiatry | Admitting: Podiatry

## 2016-01-11 DIAGNOSIS — M722 Plantar fascial fibromatosis: Secondary | ICD-10-CM

## 2016-01-11 DIAGNOSIS — R2242 Localized swelling, mass and lump, left lower limb: Secondary | ICD-10-CM | POA: Diagnosis not present

## 2016-01-11 DIAGNOSIS — R52 Pain, unspecified: Secondary | ICD-10-CM

## 2016-01-11 NOTE — Patient Instructions (Signed)

## 2016-01-11 NOTE — Progress Notes (Signed)
   Subjective:    Patient ID: Karen Frazier, female    DOB: [birth year], 35 y.o.   MRN: [id number]  HPI  35 year old female presents the also concerns of the soft tissue mass to the bottom of her right first been ongoing for several years since beginning bigger. She's had no previous treatment. Denies any recent injury or trauma. No swelling or redness. No joint pains with walking discitis a throbbing sensation. Tenderness or tingling. Other complete a this time.  Review of Systems  All other systems reviewed and are negative.      Objective:   Physical Exam General: AAO x3, NAD  Dermatological: Skin is warm, dry and supple bilateral. Nails x 10 are well manicured; remaining integument appears unremarkable at this time. There are no open sores, no preulcerative lesions, no rash or signs of infection present.  Vascular: Dorsalis Pedis artery and Posterior Tibial artery pedal pulses are 2/4 bilateral with immedate capillary fill time. There is no pain with calf compression, swelling, warmth, erythema.   Neruologic: Grossly intact via light touch bilateral. Vibratory intact via tuning fork bilateral. Protective threshold with Semmes Wienstein monofilament intact to all pedal sites bilateral.   Musculoskeletal: Firm nonmobile soft tissue mass present on the medial and the plantar fashion the arch of the left side. There is mild to palpation on this area. No one edema, erythema. No other areas of tenderness bilaterally. MMT 5/5.  Gait: Unassisted, Nonantalgic.      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 -Order compound cream to include verapamil -Stretching exercises -Follow up as scheduled or sooner if needed.  Ovid Curd, DPM

## 2016-01-12 DIAGNOSIS — J3089 Other allergic rhinitis: Secondary | ICD-10-CM | POA: Diagnosis not present

## 2016-01-12 DIAGNOSIS — J3081 Allergic rhinitis due to animal (cat) (dog) hair and dander: Secondary | ICD-10-CM | POA: Diagnosis not present

## 2016-01-12 DIAGNOSIS — J301 Allergic rhinitis due to pollen: Secondary | ICD-10-CM | POA: Diagnosis not present

## 2016-01-13 ENCOUNTER — Telehealth: Payer: Self-pay | Admitting: *Deleted

## 2016-01-17 NOTE — Telephone Encounter (Signed)
Dr. Ardelle Anton ordered [facility] scar cream. Faxed.

## 2016-01-24 DIAGNOSIS — J3081 Allergic rhinitis due to animal (cat) (dog) hair and dander: Secondary | ICD-10-CM | POA: Diagnosis not present

## 2016-01-24 DIAGNOSIS — J3089 Other allergic rhinitis: Secondary | ICD-10-CM | POA: Diagnosis not present

## 2016-01-24 DIAGNOSIS — J301 Allergic rhinitis due to pollen: Secondary | ICD-10-CM | POA: Diagnosis not present

## 2016-01-31 DIAGNOSIS — J301 Allergic rhinitis due to pollen: Secondary | ICD-10-CM | POA: Diagnosis not present

## 2016-01-31 DIAGNOSIS — J3081 Allergic rhinitis due to animal (cat) (dog) hair and dander: Secondary | ICD-10-CM | POA: Diagnosis not present

## 2016-01-31 DIAGNOSIS — J3089 Other allergic rhinitis: Secondary | ICD-10-CM | POA: Diagnosis not present

## 2016-02-07 DIAGNOSIS — J3081 Allergic rhinitis due to animal (cat) (dog) hair and dander: Secondary | ICD-10-CM | POA: Diagnosis not present

## 2016-02-07 DIAGNOSIS — J3089 Other allergic rhinitis: Secondary | ICD-10-CM | POA: Diagnosis not present

## 2016-02-07 DIAGNOSIS — J301 Allergic rhinitis due to pollen: Secondary | ICD-10-CM | POA: Diagnosis not present

## 2016-02-14 DIAGNOSIS — J3081 Allergic rhinitis due to animal (cat) (dog) hair and dander: Secondary | ICD-10-CM | POA: Diagnosis not present

## 2016-02-14 DIAGNOSIS — J301 Allergic rhinitis due to pollen: Secondary | ICD-10-CM | POA: Diagnosis not present

## 2016-02-14 DIAGNOSIS — J3089 Other allergic rhinitis: Secondary | ICD-10-CM | POA: Diagnosis not present

## 2016-02-23 DIAGNOSIS — J301 Allergic rhinitis due to pollen: Secondary | ICD-10-CM | POA: Diagnosis not present

## 2016-02-23 DIAGNOSIS — J3089 Other allergic rhinitis: Secondary | ICD-10-CM | POA: Diagnosis not present

## 2016-02-23 DIAGNOSIS — J3081 Allergic rhinitis due to animal (cat) (dog) hair and dander: Secondary | ICD-10-CM | POA: Diagnosis not present

## 2016-02-28 ENCOUNTER — Ambulatory Visit (INDEPENDENT_AMBULATORY_CARE_PROVIDER_SITE_OTHER): Payer: BLUE CROSS/BLUE SHIELD | Admitting: Podiatry

## 2016-02-28 ENCOUNTER — Ambulatory Visit: Payer: Self-pay | Admitting: Podiatry

## 2016-02-28 ENCOUNTER — Encounter: Payer: Self-pay | Admitting: Podiatry

## 2016-02-28 DIAGNOSIS — M722 Plantar fascial fibromatosis: Secondary | ICD-10-CM | POA: Diagnosis not present

## 2016-02-28 NOTE — Progress Notes (Signed)
Subjective: 35 year old female presents the office today for follow-up evaluation of left foot plantar fibroma. She states that she is doing better and there is not as painful and is softening up. She has been using the verapamil cream daily. Denies any recent injury or trauma. Denies any systemic complaints such as fevers, chills, nausea, vomiting. No acute changes since last appointment, and no other complaints at this time.   Objective: AAO x3, NAD DP/PT pulses palpable bilaterally, CRT less than 3 seconds Soft tissue mass present on the medial band of plantar fascia arch of the foot on the right side however disappear be softer nature and there is decreased tenderness palpation along the area. There is no erythema or increase in warmth or any swelling. No other areas of tenderness bilaterally. No edema, erythema, increase in warmth to bilateral lower extremities.  No open lesions or pre-ulcerative lesions.  No pain with calf compression, swelling, warmth, erythema  Assessment: Plantar fibroma right foot, improving  Plan: -All treatment options discussed with the patient including all alternatives, risks, complications.  -Discussed steroid injection which she wishes to hold off. Continue verapamil cream daily. Stretching exercises daily. Offloading pads were dispensed. Follow-up in 4-6 weeks if symptoms continue or sooner if any issues are to arise. In the meantime I encouraged her to call any questions, concerns or any change in symptoms.  Ovid CurdMatthew Wagoner, DPM

## 2016-02-28 NOTE — Patient Instructions (Signed)

## 2016-03-08 DIAGNOSIS — J3089 Other allergic rhinitis: Secondary | ICD-10-CM | POA: Diagnosis not present

## 2016-03-08 DIAGNOSIS — J301 Allergic rhinitis due to pollen: Secondary | ICD-10-CM | POA: Diagnosis not present

## 2016-03-08 DIAGNOSIS — J3081 Allergic rhinitis due to animal (cat) (dog) hair and dander: Secondary | ICD-10-CM | POA: Diagnosis not present

## 2016-03-21 MED ORDER — NONFORMULARY OR COMPOUNDED ITEM: 2 refills | Status: DC

## 2016-03-22 DIAGNOSIS — J3089 Other allergic rhinitis: Secondary | ICD-10-CM | POA: Diagnosis not present

## 2016-03-22 DIAGNOSIS — J301 Allergic rhinitis due to pollen: Secondary | ICD-10-CM | POA: Diagnosis not present

## 2016-03-22 DIAGNOSIS — J3081 Allergic rhinitis due to animal (cat) (dog) hair and dander: Secondary | ICD-10-CM | POA: Diagnosis not present

## 2016-03-27 DIAGNOSIS — J3089 Other allergic rhinitis: Secondary | ICD-10-CM | POA: Diagnosis not present

## 2016-03-27 DIAGNOSIS — J3081 Allergic rhinitis due to animal (cat) (dog) hair and dander: Secondary | ICD-10-CM | POA: Diagnosis not present

## 2016-03-27 DIAGNOSIS — J301 Allergic rhinitis due to pollen: Secondary | ICD-10-CM | POA: Diagnosis not present

## 2016-04-05 DIAGNOSIS — J3089 Other allergic rhinitis: Secondary | ICD-10-CM | POA: Diagnosis not present

## 2016-04-05 DIAGNOSIS — J3081 Allergic rhinitis due to animal (cat) (dog) hair and dander: Secondary | ICD-10-CM | POA: Diagnosis not present

## 2016-04-05 DIAGNOSIS — J301 Allergic rhinitis due to pollen: Secondary | ICD-10-CM | POA: Diagnosis not present

## 2016-04-10 ENCOUNTER — Ambulatory Visit: Payer: BLUE CROSS/BLUE SHIELD | Admitting: Podiatry

## 2016-04-12 DIAGNOSIS — J3081 Allergic rhinitis due to animal (cat) (dog) hair and dander: Secondary | ICD-10-CM | POA: Diagnosis not present

## 2016-04-12 DIAGNOSIS — J3089 Other allergic rhinitis: Secondary | ICD-10-CM | POA: Diagnosis not present

## 2016-04-12 DIAGNOSIS — J301 Allergic rhinitis due to pollen: Secondary | ICD-10-CM | POA: Diagnosis not present

## 2016-04-19 DIAGNOSIS — J3081 Allergic rhinitis due to animal (cat) (dog) hair and dander: Secondary | ICD-10-CM | POA: Diagnosis not present

## 2016-04-19 DIAGNOSIS — J301 Allergic rhinitis due to pollen: Secondary | ICD-10-CM | POA: Diagnosis not present

## 2016-04-19 DIAGNOSIS — J3089 Other allergic rhinitis: Secondary | ICD-10-CM | POA: Diagnosis not present

## 2016-04-24 ENCOUNTER — Ambulatory Visit (INDEPENDENT_AMBULATORY_CARE_PROVIDER_SITE_OTHER): Payer: BLUE CROSS/BLUE SHIELD | Admitting: Physician Assistant

## 2016-04-24 ENCOUNTER — Encounter: Payer: Self-pay | Admitting: Physician Assistant

## 2016-04-24 ENCOUNTER — Ambulatory Visit (HOSPITAL_BASED_OUTPATIENT_CLINIC_OR_DEPARTMENT_OTHER)
Admission: RE | Admit: 2016-04-24 | Discharge: 2016-04-24 | Disposition: A | Discharge status: Home / Self Care | Payer: BLUE CROSS/BLUE SHIELD | Source: Ambulatory Visit | Attending: Physician Assistant | Admitting: Physician Assistant

## 2016-04-24 VITALS — BP 106/68 | HR 83 | Temp 98.1°F | Resp 16 | Ht 64.0 in | Wt 175.4 lb

## 2016-04-24 DIAGNOSIS — M79661 Pain in right lower leg: Secondary | ICD-10-CM | POA: Diagnosis not present

## 2016-04-24 DIAGNOSIS — I82811 Embolism and thrombosis of superficial veins of right lower extremities: Secondary | ICD-10-CM | POA: Diagnosis not present

## 2016-04-24 DIAGNOSIS — I8001 Phlebitis and thrombophlebitis of superficial vessels of right lower extremity: Secondary | ICD-10-CM | POA: Diagnosis not present

## 2016-04-24 MED ORDER — MELOXICAM 15 MG PO TABS: 15.0000 mg | ORAL_TABLET | Freq: Every day | ORAL | 0 refills | Status: DC

## 2016-04-24 NOTE — Progress Notes (Signed)
Patient presents to clinic today c/o 4 days of R calf pain described as sore and aching with pain with flexion of foot. Denies trauma or injury. Denies redness or bruising.  Questionable swelling per patient. Denies recent surgery or prolonged immobilization. Denies hx of clot or hypercoagulable state. Denies chest pain or SOB.  Past Medical History:  Diagnosis Date  . Asthma   . Environmental allergies   . History of chicken pox   . Seasonal allergies   . UTI (lower urinary tract infection)     Current Outpatient Prescriptions on File Prior to Visit  Medication Sig Dispense Refill  . albuterol (PROVENTIL HFA;VENTOLIN HFA) 108 (90 BASE) MCG/ACT inhaler Inhale 1-2 puffs into the lungs every 6 (six) hours as needed for wheezing or shortness of breath.    . EPINEPHrine 0.3 mg/0.3 mL IJ SOAJ injection Inject into the muscle once. Reported on 07/15/2015    . montelukast (SINGULAIR) 10 MG tablet Take 10 mg by mouth daily as needed.    . NONFORMULARY OR COMPOUNDED ITEM [facility] compound:  Scar Cream - Verapamil 10%, Pentoxifylline 5%, apply 1-2 grams to affected area 3-4 times daily. 120 each 2  . norethindrone-ethinyl estradiol (JUNEL FE,GILDESS FE,LOESTRIN FE) 1-20 MG-MCG tablet Take 1 tablet by mouth daily.     No current facility-administered medications on file prior to visit.     Allergies  Allergen Reactions  . Gelatin Anaphylaxis    Nothing Made In Gel [caps]  . Other Anaphylaxis    ALL Mammal Meat Products    Family History  Problem Relation Age of Onset  . Hyperlipidemia Father     Living  . Diabetes Mother     Living  . Hypertension Mother   . Hypertension Father   . Allergy (severe) Mother     Alpha-gal  . Other Mother     IVP Dye  . Heart disease Maternal Grandfather   . COPD Paternal Grandfather   . Healthy Sister     x1  . Allergies Daughter     x1  . Healthy Daughter     x2    Social History   Social History  . Marital status: Married   Spouse name: N/A  . Number of children: N/A  . Years of education: N/A   Occupational History  . farmer    Social History Main Topics  . Smoking status: Never Smoker  . Smokeless tobacco: Never Used  . Alcohol use 0.6 oz/week    1 Standard drinks or equivalent per week  . Drug use: No  . Sexual activity: Yes    Partners: Male   Other Topics Concern  . None   Social History Narrative  . None    Review of Systems - See HPI.  All other ROS are negative.  BP 106/68 (BP Location: Right Arm, Patient Position: Sitting, Cuff Size: Normal)   Pulse 83   Temp 98.1 F (36.7 C) (Oral)   Resp 16   Ht 5\' 4"  (1.626 m)   Wt 175 lb 6 oz (79.5 kg)   LMP 04/08/2016   SpO2 99%   BMI 30.10 kg/m   Physical Exam  Constitutional: She is oriented to person, place, and time and well-developed, well-nourished, and in no distress.  HENT:  Head: Normocephalic and atraumatic.  Eyes: Conjunctivae are normal.  Neck: Neck supple.  Cardiovascular: Normal rate, regular rhythm, normal heart sounds and intact distal pulses.   Pulses:      Dorsalis pedis  pulses are 2+ on the right side, and 2+ on the left side.       Posterior tibial pulses are 2+ on the right side, and 2+ on the left side.  Pulmonary/Chest: Effort normal.  Musculoskeletal:       Right knee: She exhibits normal range of motion.       Right lower leg: She exhibits tenderness. She exhibits no bony tenderness.  Positive Homan sign No palpaple SVT noted. No gross deformity noted.  Extremity without swelling/edema.  Neurological: She is alert and oriented to person, place, and time.  Skin: Skin is warm and dry. No rash noted.  Psychiatric: Affect normal.  Vitals reviewed.  Assessment/Plan: 1. Right calf pain STAT US obtained revealing occlusive thrombophlebitis of lesser saphenous vein without any extension. NSAID and 81 mg ASA started. Reviewed up-to-date and feel anticoagulation not indicated due to localized clot without  extension. Discussed with supervising MD Danise EdgeStacey Blyth who is in agreement with plan of care. Compression and elevation recommended. Supportive measures reviewed. FU Friday. If any worsening symptoms or if there is chest discomfort or any SOB, she is to go to the ER. OCPS have been stopped for now. - US Venous Img Lower Unilateral Right; Future   Piedad ClimesMartin, Shiraz Bastyr Cody, PA-C

## 2016-04-24 NOTE — Progress Notes (Signed)
Pre visit review using our clinic review tool, if applicable. No additional management support is needed unless otherwise documented below in the visit note/SLS  

## 2016-04-24 NOTE — Patient Instructions (Addendum)
Again your ultrasound shows a superficial blood clot in one of the lesser veins in the R lower leg. Take the Mobic as directed with food. Start a baby Aspirin (81 mg) daily. Stop the birth control for now. Abstain from intercourse until follow-up. No more stretching exercises for now. Apply ace wrap to calf. Elevate leg while resting but stay active and moving around. Follow-up with me Friday.  If you develop any shortness of breath or chest discomfort, or if anything worsens, you need to go to the ER.

## 2016-04-30 ENCOUNTER — Ambulatory Visit (INDEPENDENT_AMBULATORY_CARE_PROVIDER_SITE_OTHER): Payer: BLUE CROSS/BLUE SHIELD | Admitting: Physician Assistant

## 2016-04-30 ENCOUNTER — Encounter: Payer: Self-pay | Admitting: Physician Assistant

## 2016-04-30 VITALS — BP 102/78 | HR 75 | Temp 97.9°F | Resp 14 | Wt 180.0 lb

## 2016-04-30 DIAGNOSIS — I8001 Phlebitis and thrombophlebitis of superficial vessels of right lower extremity: Secondary | ICD-10-CM | POA: Insufficient documentation

## 2016-04-30 NOTE — Progress Notes (Signed)
Pre visit review using our clinic review tool, if applicable. No additional management support is needed unless otherwise documented below in the visit note. 

## 2016-04-30 NOTE — Progress Notes (Signed)
Patient presents to clinic today for follow-up of superficial thrombophlebitis of R lesser saphenous vein without extension into deep system. Patient was started on 81 mg ASA daily, taken off of OCP and given Mobic for pain and inflammation. Was also instructed to wrap extremity and to elevate while resting. Patient voices following instructions as directed. Notes improvement in pain. Notes mild swelling of R ankle but only after wearing ACE wrap. Denies new or worsening symptoms. Denies chest pain, racing heart or SOB.  Past Medical History:  Diagnosis Date  . Asthma   . Environmental allergies   . History of chicken pox   . Seasonal allergies   . UTI (lower urinary tract infection)     Current Outpatient Prescriptions on File Prior to Visit  Medication Sig Dispense Refill  . albuterol (PROVENTIL HFA;VENTOLIN HFA) 108 (90 BASE) MCG/ACT inhaler Inhale 1-2 puffs into the lungs every 6 (six) hours as needed for wheezing or shortness of breath.    . EPINEPHrine 0.3 mg/0.3 mL IJ SOAJ injection Inject into the muscle once. Reported on 07/15/2015    . meloxicam (MOBIC) 15 MG tablet Take 1 tablet (15 mg total) by mouth daily. 20 tablet 0  . montelukast (SINGULAIR) 10 MG tablet Take 10 mg by mouth daily as needed.    . NONFORMULARY OR COMPOUNDED ITEM [facility] compound:  Scar Cream - Verapamil 10%, Pentoxifylline 5%, apply 1-2 grams to affected area 3-4 times daily. 120 each 2  . norethindrone-ethinyl estradiol (JUNEL FE,GILDESS FE,LOESTRIN FE) 1-20 MG-MCG tablet Take 1 tablet by mouth daily.     No current facility-administered medications on file prior to visit.     Allergies  Allergen Reactions  . Gelatin Anaphylaxis    Nothing Made In Gel [caps]  . Other Anaphylaxis    ALL Mammal Meat Products    Family History  Problem Relation Age of Onset  . Hyperlipidemia Father     Living  . Hypertension Father   . Diabetes Mother     Living  . Hypertension Mother   . Allergy  (severe) Mother     Alpha-gal  . Other Mother     IVP Dye  . Heart disease Maternal Grandfather   . COPD Paternal Grandfather   . Healthy Sister     x1  . Allergies Daughter     x1  . Healthy Daughter     x2    Social History   Social History  . Marital status: Married    Spouse name: N/A  . Number of children: N/A  . Years of education: N/A   Occupational History  . farmer    Social History Main Topics  . Smoking status: Never Smoker  . Smokeless tobacco: Never Used  . Alcohol use 0.6 oz/week    1 Standard drinks or equivalent per week  . Drug use: No  . Sexual activity: Yes    Partners: Male   Other Topics Concern  . None   Social History Narrative  . None   Review of Systems - See HPI.  All other ROS are negative.  BP 102/78   Pulse 75   Temp 97.9 F (36.6 C) (Oral)   Resp 14   Wt 180 lb (81.6 kg)   LMP 04/08/2016   SpO2 98%   BMI 30.90 kg/m   Physical Exam  Constitutional: She is oriented to person, place, and time and well-developed, well-nourished, and in no distress.  HENT:  Head: Normocephalic and atraumatic.  Eyes: Conjunctivae are normal.  Neck: Neck supple.  Cardiovascular: Normal rate, regular rhythm, normal heart sounds and intact distal pulses.   Pulses:      Popliteal pulses are 2+ on the right side.       Dorsalis pedis pulses are 2+ on the right side.       Posterior tibial pulses are 2+ on the right side.  RLE neurovascularly intact. Mild non-pitting edema of ankle. No calf swelling noted on examination.  Pulmonary/Chest: Effort normal.  Neurological: She is alert and oriented to person, place, and time.  Skin: Skin is warm and dry. No rash noted.  Psychiatric: Affect normal.  Vitals reviewed.  Assessment/Plan: 1. Thrombophlebitis of superficial veins of right lower extremity Pain improving. No alarm signs/symptoms. Will continue supportive measures. Discuss OCPs as potential risk factors for clots. Patient been on current  regimen for > 7 years without issue. No other known hypercoagulable state. Discussed switch to IUD, Depo, implantable or Progestin only. Declines at present. Will follow. Discussed if there is every any recurrence, she would need to be taken of her OCP. Encouraged her to call and discuss with GYN. Daphine Deutscher.   Cuahutemoc Attar, Sherley BoundsWilliam Cody, PA-C

## 2016-04-30 NOTE — Patient Instructions (Signed)
Please continue compression (not too tight though), elevation, Anti-inflammatory and add on heating pad to the area for 10 minutes a few times per day.  Symptoms should continue to resolve.  If you note any worsening pain or note swelling, please come see us immediately. If you note any chest pain or SOB, please go to the ER.  Can restart the OCPs once symptoms are resolving, although we discussed Depo Provera injection or Progestin only pill may be good alternatives. If you note any further superficial clots, you would not be a candidate for OCPs.

## 2016-05-03 DIAGNOSIS — J301 Allergic rhinitis due to pollen: Secondary | ICD-10-CM | POA: Diagnosis not present

## 2016-05-03 DIAGNOSIS — J3081 Allergic rhinitis due to animal (cat) (dog) hair and dander: Secondary | ICD-10-CM | POA: Diagnosis not present

## 2016-05-03 DIAGNOSIS — J3089 Other allergic rhinitis: Secondary | ICD-10-CM | POA: Diagnosis not present

## 2016-05-15 DIAGNOSIS — J3089 Other allergic rhinitis: Secondary | ICD-10-CM | POA: Diagnosis not present

## 2016-05-15 DIAGNOSIS — J301 Allergic rhinitis due to pollen: Secondary | ICD-10-CM | POA: Diagnosis not present

## 2016-05-15 DIAGNOSIS — J3081 Allergic rhinitis due to animal (cat) (dog) hair and dander: Secondary | ICD-10-CM | POA: Diagnosis not present

## 2016-05-17 DIAGNOSIS — Z6830 Body mass index (BMI) 30.0-30.9, adult: Secondary | ICD-10-CM | POA: Diagnosis not present

## 2016-05-17 DIAGNOSIS — Z01419 Encounter for gynecological examination (general) (routine) without abnormal findings: Secondary | ICD-10-CM | POA: Diagnosis not present

## 2016-05-17 LAB — HM PAP SMEAR: HM Pap smear: NEGATIVE

## 2016-05-31 DIAGNOSIS — J301 Allergic rhinitis due to pollen: Secondary | ICD-10-CM | POA: Diagnosis not present

## 2016-05-31 DIAGNOSIS — J3089 Other allergic rhinitis: Secondary | ICD-10-CM | POA: Diagnosis not present

## 2016-05-31 DIAGNOSIS — J3081 Allergic rhinitis due to animal (cat) (dog) hair and dander: Secondary | ICD-10-CM | POA: Diagnosis not present

## 2016-06-01 DIAGNOSIS — J3081 Allergic rhinitis due to animal (cat) (dog) hair and dander: Secondary | ICD-10-CM | POA: Diagnosis not present

## 2016-06-01 DIAGNOSIS — J301 Allergic rhinitis due to pollen: Secondary | ICD-10-CM | POA: Diagnosis not present

## 2016-06-01 DIAGNOSIS — J4520 Mild intermittent asthma, uncomplicated: Secondary | ICD-10-CM | POA: Diagnosis not present

## 2016-06-01 DIAGNOSIS — J3089 Other allergic rhinitis: Secondary | ICD-10-CM | POA: Diagnosis not present

## 2016-06-05 ENCOUNTER — Telehealth: Payer: Self-pay | Admitting: Oncology

## 2016-06-05 ENCOUNTER — Encounter: Payer: Self-pay | Admitting: Oncology

## 2016-06-05 NOTE — Telephone Encounter (Signed)
Appt scheduled w/Shadad on 07/10/16 at 2pm. Pt aware to arrive 30 minutes early. Demographics verified. Letter mailed.

## 2016-06-22 DIAGNOSIS — J301 Allergic rhinitis due to pollen: Secondary | ICD-10-CM | POA: Diagnosis not present

## 2016-06-22 DIAGNOSIS — J3089 Other allergic rhinitis: Secondary | ICD-10-CM | POA: Diagnosis not present

## 2016-06-22 DIAGNOSIS — J3081 Allergic rhinitis due to animal (cat) (dog) hair and dander: Secondary | ICD-10-CM | POA: Diagnosis not present

## 2016-06-28 DIAGNOSIS — J301 Allergic rhinitis due to pollen: Secondary | ICD-10-CM | POA: Diagnosis not present

## 2016-06-28 DIAGNOSIS — J3081 Allergic rhinitis due to animal (cat) (dog) hair and dander: Secondary | ICD-10-CM | POA: Diagnosis not present

## 2016-06-28 DIAGNOSIS — J3089 Other allergic rhinitis: Secondary | ICD-10-CM | POA: Diagnosis not present

## 2016-07-02 ENCOUNTER — Telehealth: Payer: Self-pay | Admitting: Oncology

## 2016-07-02 NOTE — Telephone Encounter (Signed)
Pt had an appt originally w/Shadad but needed to be rescheduled per Melissa. Pt has been rescheduled to see Dr. Janyth ContesZhou on 1/23 at 4pm. Pt agreed to the appt date and time.

## 2016-07-10 ENCOUNTER — Encounter: Payer: Self-pay | Admitting: Oncology

## 2016-07-10 ENCOUNTER — Ambulatory Visit (HOSPITAL_BASED_OUTPATIENT_CLINIC_OR_DEPARTMENT_OTHER): Payer: BLUE CROSS/BLUE SHIELD | Admitting: Oncology

## 2016-07-10 VITALS — BP 122/66 | HR 70 | Temp 99.5°F | Resp 20 | Ht 64.0 in | Wt 180.4 lb

## 2016-07-10 DIAGNOSIS — I8001 Phlebitis and thrombophlebitis of superficial vessels of right lower extremity: Secondary | ICD-10-CM | POA: Diagnosis not present

## 2016-07-10 NOTE — Progress Notes (Signed)
[facility] Initial Visit:  Patient Care Team: Waldon Merl, PA-C as PCP - General (Physician Assistant)  CHIEF COMPLAINTS/PURPOSE OF CONSULTATION: Thrombophilia workup for DVT   HISTORY OF PRESENTING ILLNESS: Karen Frazier 36 y.o. female presents today for evaluation of DVT. Patient has a history of oral contraceptive pill use for menorrhalgia. In November she reported pain in her right calf. Doppler venous ultrasound of right lower extremity on 04/24/16 demonstrated positive for occlusive superficial thrombophlebitis with tender right lesser saphenous vein without definitive extension to the deep venous system of the right lower extremity. Her Pain resolved after 1-2 weeks on its own. At that time patient was told to get off of her OCP, however patient has resumed back on OCP after 2 weeks due to recurrent heavy menstrual bleeding. She has not had any recurrent pain or swelling in her right leg. She denies any family history of hypercoagulability to her knowledge. Patient does not smoke and never had a DVT/PE in the past. Recently she saw her OB/GYN again who told her to get off of the OCP because they thought that she actually had a DVT in November.  Review of Systems  Constitutional: Negative for appetite change, chills, fatigue and fever.  HENT:   Negative for hearing loss, lump/mass, mouth sores, sore throat and tinnitus.   Eyes: Negative for eye problems and icterus.  Respiratory: Negative for chest tightness, cough, hemoptysis, shortness of breath and wheezing.   Cardiovascular: Negative for chest pain, leg swelling and palpitations.  Gastrointestinal: Positive for constipation. Negative for abdominal distention, abdominal pain, blood in stool, diarrhea, nausea and vomiting.  Endocrine: Negative.  Negative for hot flashes.  Genitourinary: Negative for difficulty urinating, frequency and hematuria.        Menorrhalgia  Musculoskeletal: Negative for arthralgias  and neck pain.  Skin: Negative for itching and rash.  Neurological: Negative for dizziness, headaches and speech difficulty.  Hematological: Negative for adenopathy. Does not bruise/bleed easily.  Psychiatric/Behavioral: Negative for confusion. The patient is not nervous/anxious.     MEDICAL HISTORY: Past Medical History:  Diagnosis Date  . Asthma   . Environmental allergies   . History of chicken pox   . Seasonal allergies   . UTI (lower urinary tract infection)     SURGICAL HISTORY: Past Surgical History:  Procedure Laterality Date  . NO PAST SURGERIES      SOCIAL HISTORY: Social History   Social History  . Marital status: Married    Spouse name: N/A  . Number of children: N/A  . Years of education: N/A   Occupational History  . farmer    Social History Main Topics  . Smoking status: Never Smoker  . Smokeless tobacco: Never Used  . Alcohol use 0.6 oz/week    1 Standard drinks or equivalent per week  . Drug use: No  . Sexual activity: Yes    Partners: Male   Other Topics Concern  . Not on file   Social History Narrative  . No narrative on file    FAMILY HISTORY Family History  Problem Relation Age of Onset  . Hyperlipidemia Father     Living  . Hypertension Father   . Diabetes Mother     Living  . Hypertension Mother   . Allergy (severe) Mother     Alpha-gal  . Other Mother     IVP Dye  . Heart disease Maternal Grandfather   . COPD Paternal Grandfather   . Healthy Sister  x1  . Allergies Daughter     x1  . Healthy Daughter     x2    ALLERGIES:  is allergic to gelatin and other.  MEDICATIONS:  Current Outpatient Prescriptions  Medication Sig Dispense Refill  . albuterol (PROVENTIL HFA;VENTOLIN HFA) 108 (90 BASE) MCG/ACT inhaler Inhale 1-2 puffs into the lungs every 6 (six) hours as needed for wheezing or shortness of breath.    . EPINEPHrine 0.3 mg/0.3 mL IJ SOAJ injection Inject into the muscle once. Reported on 07/15/2015    .  meloxicam (MOBIC) 15 MG tablet Take 1 tablet (15 mg total) by mouth daily. 20 tablet 0  . montelukast (SINGULAIR) 10 MG tablet Take 10 mg by mouth daily as needed.    . NONFORMULARY OR COMPOUNDED ITEM [facility] compound:  Scar Cream - Verapamil 10%, Pentoxifylline 5%, apply 1-2 grams to affected area 3-4 times daily. 120 each 2  . norethindrone-ethinyl estradiol (JUNEL FE,GILDESS FE,LOESTRIN FE) 1-20 MG-MCG tablet Take 1 tablet by mouth daily.     No current facility-administered medications for this visit.     PHYSICAL EXAMINATION:     Vitals:   07/10/16 1554  BP: 122/66  Pulse: 70  Resp: 20  Temp: 99.5 F (37.5 C)    Filed Weights   07/10/16 1554  Weight: 180 lb 6.4 oz (81.8 kg)     Physical Exam  Constitutional: She is oriented to person, place, and time and well-developed, well-nourished, and in no distress. No distress.  HENT:  Head: Normocephalic and atraumatic.  Mouth/Throat: No oropharyngeal exudate.  Eyes: Conjunctivae are normal. Pupils are equal, round, and reactive to light. No scleral icterus.  Neck: Normal range of motion. Neck supple. No JVD present.  Cardiovascular: Normal rate, regular rhythm and normal heart sounds.  Exam reveals no gallop and no friction rub.   No murmur heard. Pulmonary/Chest: Breath sounds normal. No respiratory distress. She has no wheezes. She has no rales.  Abdominal: Soft. Bowel sounds are normal. She exhibits no distension. There is no tenderness. There is no guarding.  Musculoskeletal: She exhibits no edema or tenderness.  Lymphadenopathy:    She has no cervical adenopathy.  Neurological: She is alert and oriented to person, place, and time. No cranial nerve deficit.  Skin: Skin is warm and dry. No rash noted. No erythema. No pallor.  Psychiatric: Affect and judgment normal.     LABORATORY DATA: I have personally reviewed the data as listed:  No visits with results within 1 Month(s) from this visit.  Latest known  visit with results is:  Office Visit on 12/09/2015  Component Date Value Ref Range Status  . TSH 12/09/2015 1.79  0.35 - 4.50 uIU/mL Final  . Free T4 12/09/2015 0.71  0.60 - 1.60 ng/dL Final    RADIOGRAPHIC STUDIES: I have personally reviewed the radiological images as listed and agree with the findings in the report  No results found.  ASSESSMENT/PLAN 36 year old female presented for evaluation of a superficial thrombophlebitis of the right lower extremity.  PLAN: I have reviewed patient's Doppler ultrasound from November 2017 detail, she had a superficial thrombophlebitis which did NOT extend into her deep venous system. She therefore in fact did not have a DVT and can resume her OCP for menorrhagia.  I have advised her that OCP use does significantly increase her risk for thromboembolism in the future, therefore should she have any symptoms like persistent leg pain or swelling and shortness of breath/chest pain, she needs to seek medical  evaluation immediately.  I do not believe thrombophilia workup is necessary at this time since she did not in fact have a DVT. All questions were answered. The patient knows to call the clinic with any problems, questions or concerns. Return to clinic when necessary.  This note was electronically signed.    Ralene Cork, MD  07/10/2016 3:56 PM

## 2016-07-19 DIAGNOSIS — J3081 Allergic rhinitis due to animal (cat) (dog) hair and dander: Secondary | ICD-10-CM | POA: Diagnosis not present

## 2016-07-19 DIAGNOSIS — J3089 Other allergic rhinitis: Secondary | ICD-10-CM | POA: Diagnosis not present

## 2016-07-19 DIAGNOSIS — J301 Allergic rhinitis due to pollen: Secondary | ICD-10-CM | POA: Diagnosis not present

## 2016-07-31 ENCOUNTER — Encounter: Payer: Self-pay | Admitting: Adult Health

## 2016-07-31 ENCOUNTER — Ambulatory Visit (INDEPENDENT_AMBULATORY_CARE_PROVIDER_SITE_OTHER): Payer: BLUE CROSS/BLUE SHIELD | Admitting: Adult Health

## 2016-07-31 VITALS — BP 102/78 | HR 106 | Ht 64.0 in | Wt 177.5 lb

## 2016-07-31 DIAGNOSIS — K5904 Chronic idiopathic constipation: Secondary | ICD-10-CM | POA: Diagnosis not present

## 2016-07-31 DIAGNOSIS — K5900 Constipation, unspecified: Secondary | ICD-10-CM | POA: Insufficient documentation

## 2016-07-31 DIAGNOSIS — Z0000 Encounter for general adult medical examination without abnormal findings: Secondary | ICD-10-CM

## 2016-07-31 DIAGNOSIS — E756 Lipid storage disorder, unspecified: Secondary | ICD-10-CM

## 2016-07-31 DIAGNOSIS — J4520 Mild intermittent asthma, uncomplicated: Secondary | ICD-10-CM

## 2016-07-31 DIAGNOSIS — E8809 Other disorders of plasma-protein metabolism, not elsewhere classified: Secondary | ICD-10-CM

## 2016-07-31 DIAGNOSIS — R198 Other specified symptoms and signs involving the digestive system and abdomen: Secondary | ICD-10-CM | POA: Insufficient documentation

## 2016-07-31 NOTE — Progress Notes (Signed)
Subjective:    Patient ID: Karen Frazier, female    DOB: [birth year], 36 y.o.   MRN: [id number]  HPI:  Karen Frazier presents to establish as a new pt.  She is a very pleasant, very healthy mother of three (daughers 10, 9, 7), with the only complaint of chronic constipation that has been occurring > 11 years.  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 She had a RLE DVT a few months ago that was causing RLE pain behind her knee.  [place] at her PCP revealed a blood clot and she took 7 days of ASA therapy and the clot resolved.  She denies any acute pain today. Her Asthma is well managed and she "cannot recall the last time I used my inhaler". Hx of Alpha Galactosidase Deficiency- no current/acute sx's.   Patient Care Team    Relationship Specialty Notifications Start End  Waldon Merl, [place] PCP - General Physician Assistant  08/04/14     Patient Active Problem List   Diagnosis Date Noted  . Constipation 07/31/2016  . Thrombophlebitis of superficial veins of right lower extremity 04/30/2016  . 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 Visit for preventive health examination 08/04/2014  . 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 Allergic asthma 08/04/2014     Past Medical History:  Diagnosis Date  . Acute superficial venous thrombosis of right lower extremity   . Asthma   . Environmental allergies   . History of chicken pox   . Seasonal allergies   . UTI (lower urinary tract infection)      Past Surgical History:  Procedure Laterality Date  . NO PAST SURGERIES       Family History  Problem Relation Age of Onset  . Hyperlipidemia Father     Living  . Hypertension Father   . Diabetes Mother     Living  . Hypertension Mother   . Allergy (severe) Mother     Alpha-gal  . Other Mother     IVP Dye  . Heart disease Maternal Grandfather   . COPD Paternal Grandfather   . Healthy Sister     x1   . Allergies Daughter     x1  . Healthy Daughter     x2  . Diabetes Maternal Aunt   . Hyperlipidemia Maternal Aunt   . Hypertension Maternal Aunt   . Alcohol abuse Maternal Uncle   . Heart attack Maternal Uncle   . Hyperlipidemia Maternal Uncle   . Hypertension Maternal Uncle      History  Drug Use No     History  Alcohol Use  . 0.6 oz/week  . 1 Standard drinks or equivalent per week     History  Smoking Status  . Never Smoker  Smokeless Tobacco  . Never Used     Outpatient Encounter Prescriptions as of 07/31/2016  Medication Sig  . albuterol (PROVENTIL HFA;VENTOLIN HFA) 108 (90 BASE) MCG/ACT inhaler Inhale 1-2 puffs into the lungs every 6 (six) hours as needed for wheezing or shortness of breath.  . EPINEPHrine 0.3 mg/0.3 mL IJ SOAJ injection Inject into the muscle once. Reported on 07/15/2015  . montelukast (SINGULAIR) 10 MG tablet Take 10 mg by mouth daily as needed.   No facility-administered encounter medications on file as of 07/31/2016.     Allergies: Gelatin and Other  Body mass index is 30.47 kg/m.  Blood pressure 102/78, pulse Marland Kitchen)  106, height 5\' 4"  (1.626 m), weight 177 lb 8 oz (80.5 kg), last menstrual period 06/18/2016.   Review of Systems  Constitutional: Negative for activity change, appetite change, chills, diaphoresis, fatigue, fever and unexpected weight change.  HENT: Negative for postnasal drip.   Eyes: Negative for visual disturbance.  Respiratory: Negative for cough and shortness of breath.   Cardiovascular: Negative for chest pain, palpitations and leg swelling.  Gastrointestinal: Positive for abdominal distention and constipation. Negative for abdominal pain, anal bleeding, blood in stool, diarrhea, nausea, rectal pain and vomiting.       Chronic constipation for > 11 years. Excellent water and fiber consumption. 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 1-2 BMs weekly only when taking OTC Laxative.  Endocrine:  Negative for cold intolerance, heat intolerance, polydipsia, polyphagia and polyuria.  Genitourinary: Negative for difficulty urinating and flank pain.  Musculoskeletal: Negative for arthralgias, back pain, gait problem, joint swelling, myalgias, neck pain and neck stiffness.  Skin: Negative for color change, pallor, rash and wound.  Neurological: Negative for tremors, syncope, weakness and light-headedness.  Psychiatric/Behavioral: Negative for agitation, behavioral problems and sleep disturbance. The patient is not nervous/anxious.        Objective:   Physical Exam  Constitutional: She is oriented to person, place, and time. She appears well-developed and well-nourished. No distress.  HENT:  Head: Normocephalic and atraumatic.  Eyes: Conjunctivae and EOM are normal. Pupils are equal, round, and reactive to light.  Neck: Normal range of motion. Neck supple. No thyromegaly present.  Cardiovascular: Normal rate, regular rhythm, normal heart sounds and intact distal pulses.   No murmur heard. Pulmonary/Chest: Effort normal and breath sounds normal. She has no wheezes. She exhibits no tenderness.  Abdominal: Soft. Bowel sounds are normal. She exhibits no distension and no mass. There is no tenderness. There is no rebound and no guarding.  Minor bloating noted at time of examination.  Musculoskeletal: Normal range of motion.  Lymphadenopathy:    She has no cervical adenopathy.  Neurological: She is alert and oriented to person, place, and time. She has normal reflexes.  Skin: Skin is warm and dry. No rash noted. She is not diaphoretic. No erythema. No pallor.  Psychiatric: She has a normal mood and affect. Her behavior is normal. Judgment and thought content normal.  Nursing note and vitals reviewed.         Assessment & Plan:   1. Routine physical examination   2. Mild intermittent extrinsic asthma without complication   3. Alpha galactosidase deficiency   4. Chronic idiopathic  constipation   5. Visit for preventive health examination     Constipation GI referral placed. Continue excellent hydration and fiber intake.   Visit for preventive health examination Please schedule fasting lab appt. At your earliest convenience.  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    FOLLOW-UP:  Return in about 1 year (around 07/31/2017) for Regular Follow Up, Fasting Lab Draw.

## 2016-07-31 NOTE — Assessment & Plan Note (Addendum)
Please schedule fasting lab appt. At your earliest convenience.  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

## 2016-07-31 NOTE — Patient Instructions (Signed)
Heart-Healthy Eating Plan Introduction Heart-healthy meal planning includes:  Limiting unhealthy fats.  Increasing healthy fats.  Making other small dietary changes. You may need to talk with your doctor or a diet specialist (dietitian) to create an eating plan that is right for you. What types of fat should I choose?  Choose healthy fats. These include olive oil and canola oil, flaxseeds, walnuts, almonds, and seeds.  Eat more omega-3 fats. These include salmon, mackerel, sardines, tuna, flaxseed oil, and ground flaxseeds. Try to eat fish at least twice each week.  Limit saturated fats.  Saturated fats are often found in animal products, such as meats, butter, and cream.  Plant sources of saturated fats include palm oil, palm kernel oil, and coconut oil.  Avoid foods with partially hydrogenated oils in them. These include stick margarine, some tub margarines, cookies, crackers, and other baked goods. These contain trans fats. What general guidelines do I need to follow?  Check food labels carefully. Identify foods with trans fats or high amounts of saturated fat.  Fill one half of your plate with vegetables and green salads. Eat 4-5 servings of vegetables per day. A serving of vegetables is:  1 cup of raw leafy vegetables.   cup of raw or cooked cut-up vegetables.   cup of vegetable juice.  Fill one fourth of your plate with whole grains. Look for the word "whole" as the first word in the ingredient list.  Fill one fourth of your plate with lean protein foods.  Eat 4-5 servings of fruit per day. A serving of fruit is:  One medium whole fruit.   cup of dried fruit.   cup of fresh, frozen, or canned fruit.   cup of 100% fruit juice.  Eat more foods that contain soluble fiber. These include apples, broccoli, carrots, beans, peas, and barley. Try to get 20-30 g of fiber per day.  Eat more home-cooked food. Eat less restaurant, buffet, and fast food.  Limit or  avoid alcohol.  Limit foods high in starch and sugar.  Avoid fried foods.  Avoid frying your food. Try baking, boiling, grilling, or broiling it instead. You can also reduce fat by:  Removing the skin from poultry.  Removing all visible fats from meats.  Skimming the fat off of stews, soups, and gravies before serving them.  Steaming vegetables in water or broth.  Lose weight if you are overweight.  Eat 4-5 servings of nuts, legumes, and seeds per week:  One serving of dried beans or legumes equals  cup after being cooked.  One serving of nuts equals 1 ounces.  One serving of seeds equals  ounce or one tablespoon.  You may need to keep track of how much salt or sodium you eat. This is especially true if you have high blood pressure. Talk with your doctor or dietitian to get more information. What foods can I eat? Grains  Breads, including [place], white, pita, wheat, raisin, rye, oatmeal, and [place]. Tortillas that are neither fried nor made with lard or trans fat. Low-fat rolls, including hotdog and hamburger buns and English muffins. Biscuits. Muffins. Waffles. Pancakes. Light popcorn. Whole-grain cereals. Flatbread. Melba toast. Pretzels. Breadsticks. Rusks. Low-fat snacks. Low-fat crackers, including oyster, saltine, matzo, graham, animal, and rye. Rice and pasta, including brown rice and pastas that are made with whole wheat. Vegetables  All vegetables. Fruits  All fruits, but limit coconut. Meats and Other Protein Sources  Lean, well-trimmed beef, veal, pork, and lamb. Chicken and [place] without skin.  All fish and shellfish. Wild duck, rabbit, pheasant, and venison. Egg whites or low-cholesterol egg substitutes. Dried beans, peas, lentils, and tofu. Seeds and most nuts. Dairy  Low-fat or nonfat cheeses, including ricotta, string, and mozzarella. Skim or 1% milk that is liquid, powdered, or evaporated. Buttermilk that is made with low-fat milk. Nonfat or low-fat  yogurt. Beverages  Mineral water. Diet carbonated beverages. Sweets and Desserts  Sherbets and fruit ices. Honey, jam, marmalade, jelly, and syrups. Meringues and gelatins. Pure sugar candy, such as hard candy, jelly beans, gumdrops, mints, marshmallows, and small amounts of dark chocolate. [employer] food cake. Eat all sweets and desserts in moderation. Fats and Oils  Nonhydrogenated (trans-free) margarines. Vegetable oils, including soybean, sesame, sunflower, olive, peanut, safflower, corn, canola, and cottonseed. Salad dressings or mayonnaise made with a vegetable oil. Limit added fats and oils that you use for cooking, baking, salads, and as spreads. Other  Cocoa powder. Coffee and tea. All seasonings and condiments. The items listed above may not be a complete list of recommended foods or beverages. Contact your dietitian for more options.  What foods are not recommended? Grains  Breads that are made with saturated or trans fats, oils, or whole milk. Croissants. Butter rolls. Cheese breads. Sweet rolls. Donuts. Buttered popcorn. Chow mein noodles. High-fat crackers, such as cheese or butter crackers. Meats and Other Protein Sources  Fatty meats, such as hotdogs, short ribs, sausage, spareribs, bacon, rib eye roast or steak, and mutton. High-fat deli meats, such as salami and bologna. Caviar. Domestic duck and goose. Organ meats, such as kidney, liver, sweetbreads, and heart. Dairy  Cream, sour cream, cream cheese, and creamed cottage cheese. Whole-milk cheeses, including blue (bleu), [address], [place], [place], [address], [place], [address], cheddar, [place], and [place]. Whole or 2% milk that is liquid, evaporated, or condensed. Whole buttermilk. Cream sauce or high-fat cheese sauce. Yogurt that is made from whole milk. Beverages  Regular sodas and juice drinks with added sugar. Sweets and Desserts  Frosting. Pudding. Cookies. Cakes other than angel food cake. Candy that has milk chocolate or  white chocolate, hydrogenated fat, butter, coconut, or unknown ingredients. Buttered syrups. Full-fat ice cream or ice cream drinks. Fats and Oils  Gravy that has suet, meat fat, or shortening. Cocoa butter, hydrogenated oils, palm oil, coconut oil, palm kernel oil. These can often be found in baked products, candy, fried foods, nondairy creamers, and whipped toppings. Solid fats and shortenings, including bacon fat, salt pork, lard, and butter. Nondairy cream substitutes, such as coffee creamers and sour cream substitutes. Salad dressings that are made of unknown oils, cheese, or sour cream. The items listed above may not be a complete list of foods and beverages to avoid. Contact your dietitian for more information.  This information is not intended to replace advice given to you by your health care provider. Make sure you discuss any questions you have with your health care provider. Document Released: 12/04/2011 Document Revised: 11/10/2015 Document Reviewed: 11/26/2013  2017 Elsevier   Constipation, Adult Constipation is when a person has fewer bowel movements in a week than normal, has difficulty having a bowel movement, or has stools that are dry, hard, or larger than normal. Constipation may be caused by an underlying condition. It may become worse with age if a person takes certain medicines and does not take in enough fluids. Follow these instructions at home: Eating and drinking  Eat foods that have a lot of fiber, such as fresh fruits and vegetables, whole grains, and  beans.  Limit foods that are high in fat, low in fiber, or overly processed, such as french fries, hamburgers, cookies, candies, and soda.  Drink enough fluid to keep your urine clear or pale yellow. General instructions  Exercise regularly or as told by your health care provider.  Go to the restroom when you have the urge to go. Do not hold it in.  Take over-the-counter and prescription medicines only as told by  your health care provider. These include any fiber supplements.  Practice pelvic floor retraining exercises, such as deep breathing while relaxing the lower abdomen and pelvic floor relaxation during bowel movements.  Watch your condition for any changes.  Keep all follow-up visits as told by your health care provider. This is important. Contact a health care provider if:  You have pain that gets worse.  You have a fever.  You do not have a bowel movement after 4 days.  You vomit.  You are not hungry.  You lose weight.  You are bleeding from the anus.  You have thin, pencil-like stools. Get help right away if:  You have a fever and your symptoms suddenly get worse.  You leak stool or have blood in your stool.  Your abdomen is bloated.  You have severe pain in your abdomen.  You feel dizzy or you faint. This information is not intended to replace advice given to you by your health care provider. Make sure you discuss any questions you have with your health care provider. Document Released: 03/02/2004 Document Revised: 12/23/2015 Document Reviewed: 11/23/2015 Elsevier Interactive Patient Education  2017 Elsevier Inc.  GI referral placed for chronic constipation. Continue excellent diet and regular movement. Please return for fasting labs at your earliest convenience. Annual follow-up, or sooner if needed.

## 2016-07-31 NOTE — Assessment & Plan Note (Signed)
GI referral placed. Continue excellent hydration and fiber intake.

## 2016-08-02 DIAGNOSIS — J3089 Other allergic rhinitis: Secondary | ICD-10-CM | POA: Diagnosis not present

## 2016-08-02 DIAGNOSIS — J301 Allergic rhinitis due to pollen: Secondary | ICD-10-CM | POA: Diagnosis not present

## 2016-08-02 DIAGNOSIS — J3081 Allergic rhinitis due to animal (cat) (dog) hair and dander: Secondary | ICD-10-CM | POA: Diagnosis not present

## 2016-08-06 DIAGNOSIS — J301 Allergic rhinitis due to pollen: Secondary | ICD-10-CM | POA: Diagnosis not present

## 2016-08-06 DIAGNOSIS — J3081 Allergic rhinitis due to animal (cat) (dog) hair and dander: Secondary | ICD-10-CM | POA: Diagnosis not present

## 2016-08-07 DIAGNOSIS — J3089 Other allergic rhinitis: Secondary | ICD-10-CM | POA: Diagnosis not present

## 2016-08-08 DIAGNOSIS — S0501XA Injury of conjunctiva and corneal abrasion without foreign body, right eye, initial encounter: Secondary | ICD-10-CM | POA: Diagnosis not present

## 2016-08-08 DIAGNOSIS — H18891 Other specified disorders of cornea, right eye: Secondary | ICD-10-CM | POA: Diagnosis not present

## 2016-08-09 DIAGNOSIS — J301 Allergic rhinitis due to pollen: Secondary | ICD-10-CM | POA: Diagnosis not present

## 2016-08-09 DIAGNOSIS — J3081 Allergic rhinitis due to animal (cat) (dog) hair and dander: Secondary | ICD-10-CM | POA: Diagnosis not present

## 2016-08-09 DIAGNOSIS — J3089 Other allergic rhinitis: Secondary | ICD-10-CM | POA: Diagnosis not present

## 2016-08-10 ENCOUNTER — Other Ambulatory Visit (INDEPENDENT_AMBULATORY_CARE_PROVIDER_SITE_OTHER): Payer: BLUE CROSS/BLUE SHIELD

## 2016-08-10 DIAGNOSIS — Z0000 Encounter for general adult medical examination without abnormal findings: Secondary | ICD-10-CM | POA: Diagnosis not present

## 2016-08-11 LAB — COMPREHENSIVE METABOLIC PANEL
ALT: 13 IU/L (ref 0–32)
AST: 15 IU/L (ref 0–40)
Albumin/Globulin Ratio: 2 (ref 1.2–2.2)
Albumin: 4.6 g/dL (ref 3.5–5.5)
Alkaline Phosphatase: 55 IU/L (ref 39–117)
BUN/Creatinine Ratio: 23 (ref 9–23)
BUN: 15 mg/dL (ref 6–20)
Bilirubin Total: 0.3 mg/dL (ref 0.0–1.2)
CO2: 21 mmol/L (ref 18–29)
Calcium: 9.3 mg/dL (ref 8.7–10.2)
Chloride: 103 mmol/L (ref 96–106)
Creatinine, Ser: 0.64 mg/dL (ref 0.57–1.00)
GFR calc Af Amer: 134 mL/min/{1.73_m2} (ref >59)
GFR calc non Af Amer: 116 mL/min/{1.73_m2} (ref >59)
Globulin, Total: 2.3 g/dL (ref 1.5–4.5)
Glucose: 94 mg/dL (ref 65–99)
Potassium: 5 mmol/L (ref 3.5–5.2)
Sodium: 141 mmol/L (ref 134–144)
Total Protein: 6.9 g/dL (ref 6.0–8.5)

## 2016-08-11 LAB — CBC WITH DIFFERENTIAL/PLATELET
Basophils Absolute: 0 10*3/uL (ref 0.0–0.2)
Basos: 0 %
EOS (ABSOLUTE): 0.1 10*3/uL (ref 0.0–0.4)
Eos: 1 %
Hematocrit: 37.3 % (ref 34.0–46.6)
Hemoglobin: 12.6 g/dL (ref 11.1–15.9)
Immature Grans (Abs): 0 10*3/uL (ref 0.0–0.1)
Immature Granulocytes: 0 %
Lymphocytes Absolute: 2.7 10*3/uL (ref 0.7–3.1)
Lymphs: 36 %
MCH: 31.3 pg (ref 26.6–33.0)
MCHC: 33.8 g/dL (ref 31.5–35.7)
MCV: 93 fL (ref 79–97)
Monocytes Absolute: 0.7 10*3/uL (ref 0.1–0.9)
Monocytes: 9 %
Neutrophils Absolute: 4.1 10*3/uL (ref 1.4–7.0)
Neutrophils: 54 %
Platelets: 280 10*3/uL (ref 150–379)
RBC: 4.03 x10E6/uL (ref 3.77–5.28)
RDW: 14 % (ref 12.3–15.4)
WBC: 7.7 10*3/uL (ref 3.4–10.8)

## 2016-08-11 LAB — VITAMIN D 25 HYDROXY (VIT D DEFICIENCY, FRACTURES): Vit D, 25-Hydroxy: 21.6 ng/mL — ABNORMAL LOW (ref 30.0–100.0)

## 2016-08-11 LAB — LIPID PANEL
Chol/HDL Ratio: 3 ratio units (ref 0.0–4.4)
Cholesterol, Total: 208 mg/dL — ABNORMAL HIGH (ref 100–199)
HDL: 69 mg/dL (ref >39)
LDL Calculated: 127 mg/dL — ABNORMAL HIGH (ref 0–99)
Triglycerides: 60 mg/dL (ref 0–149)
VLDL Cholesterol Cal: 12 mg/dL (ref 5–40)

## 2016-08-11 LAB — HEMOGLOBIN A1C
Est. average glucose Bld gHb Est-mCnc: 105 mg/dL
Hgb A1c MFr Bld: 5.3 % (ref 4.8–5.6)

## 2016-08-11 LAB — TSH: TSH: 1.65 u[IU]/mL (ref 0.450–4.500)

## 2016-08-13 ENCOUNTER — Other Ambulatory Visit: Payer: Self-pay | Admitting: Adult Health

## 2016-08-13 DIAGNOSIS — E559 Vitamin D deficiency, unspecified: Secondary | ICD-10-CM

## 2016-08-13 MED ORDER — VITAMIN D (ERGOCALCIFEROL) 1.25 MG (50000 UNIT) PO CAPS: 50000.0000 [IU] | ORAL_CAPSULE | ORAL | 0 refills | Status: DC

## 2016-08-13 NOTE — Progress Notes (Unsigned)
ergo 

## 2016-08-16 DIAGNOSIS — J3081 Allergic rhinitis due to animal (cat) (dog) hair and dander: Secondary | ICD-10-CM | POA: Diagnosis not present

## 2016-08-16 DIAGNOSIS — J3089 Other allergic rhinitis: Secondary | ICD-10-CM | POA: Diagnosis not present

## 2016-08-16 DIAGNOSIS — J301 Allergic rhinitis due to pollen: Secondary | ICD-10-CM | POA: Diagnosis not present

## 2016-08-23 DIAGNOSIS — J3089 Other allergic rhinitis: Secondary | ICD-10-CM | POA: Diagnosis not present

## 2016-08-23 DIAGNOSIS — J301 Allergic rhinitis due to pollen: Secondary | ICD-10-CM | POA: Diagnosis not present

## 2016-08-23 DIAGNOSIS — J3081 Allergic rhinitis due to animal (cat) (dog) hair and dander: Secondary | ICD-10-CM | POA: Diagnosis not present

## 2016-08-30 ENCOUNTER — Telehealth: Payer: Self-pay

## 2016-08-30 DIAGNOSIS — J3089 Other allergic rhinitis: Secondary | ICD-10-CM | POA: Diagnosis not present

## 2016-08-30 DIAGNOSIS — J3081 Allergic rhinitis due to animal (cat) (dog) hair and dander: Secondary | ICD-10-CM | POA: Diagnosis not present

## 2016-08-30 DIAGNOSIS — J301 Allergic rhinitis due to pollen: Secondary | ICD-10-CM | POA: Diagnosis not present

## 2016-08-30 NOTE — Telephone Encounter (Signed)
Discussed with pt lab results since I received notification thru Epic that pt had not read the MyChart message sent with lab results.  Pt expressed understanding.  Karen Frazier. [name], CMA

## 2016-09-06 DIAGNOSIS — J301 Allergic rhinitis due to pollen: Secondary | ICD-10-CM | POA: Diagnosis not present

## 2016-09-06 DIAGNOSIS — J3089 Other allergic rhinitis: Secondary | ICD-10-CM | POA: Diagnosis not present

## 2016-09-06 DIAGNOSIS — J3081 Allergic rhinitis due to animal (cat) (dog) hair and dander: Secondary | ICD-10-CM | POA: Diagnosis not present

## 2016-09-11 DIAGNOSIS — J3089 Other allergic rhinitis: Secondary | ICD-10-CM | POA: Diagnosis not present

## 2016-09-11 DIAGNOSIS — J3081 Allergic rhinitis due to animal (cat) (dog) hair and dander: Secondary | ICD-10-CM | POA: Diagnosis not present

## 2016-09-11 DIAGNOSIS — J301 Allergic rhinitis due to pollen: Secondary | ICD-10-CM | POA: Diagnosis not present

## 2016-09-25 DIAGNOSIS — J3089 Other allergic rhinitis: Secondary | ICD-10-CM | POA: Diagnosis not present

## 2016-09-25 DIAGNOSIS — J3081 Allergic rhinitis due to animal (cat) (dog) hair and dander: Secondary | ICD-10-CM | POA: Diagnosis not present

## 2016-09-25 DIAGNOSIS — J301 Allergic rhinitis due to pollen: Secondary | ICD-10-CM | POA: Diagnosis not present

## 2016-10-04 DIAGNOSIS — J3081 Allergic rhinitis due to animal (cat) (dog) hair and dander: Secondary | ICD-10-CM | POA: Diagnosis not present

## 2016-10-04 DIAGNOSIS — J3089 Other allergic rhinitis: Secondary | ICD-10-CM | POA: Diagnosis not present

## 2016-10-04 DIAGNOSIS — J301 Allergic rhinitis due to pollen: Secondary | ICD-10-CM | POA: Diagnosis not present

## 2016-10-09 DIAGNOSIS — J3089 Other allergic rhinitis: Secondary | ICD-10-CM | POA: Diagnosis not present

## 2016-10-09 DIAGNOSIS — J3081 Allergic rhinitis due to animal (cat) (dog) hair and dander: Secondary | ICD-10-CM | POA: Diagnosis not present

## 2016-10-09 DIAGNOSIS — J301 Allergic rhinitis due to pollen: Secondary | ICD-10-CM | POA: Diagnosis not present

## 2016-10-11 DIAGNOSIS — J3081 Allergic rhinitis due to animal (cat) (dog) hair and dander: Secondary | ICD-10-CM | POA: Diagnosis not present

## 2016-10-11 DIAGNOSIS — J3089 Other allergic rhinitis: Secondary | ICD-10-CM | POA: Diagnosis not present

## 2016-10-11 DIAGNOSIS — J301 Allergic rhinitis due to pollen: Secondary | ICD-10-CM | POA: Diagnosis not present

## 2016-10-18 DIAGNOSIS — J3081 Allergic rhinitis due to animal (cat) (dog) hair and dander: Secondary | ICD-10-CM | POA: Diagnosis not present

## 2016-10-18 DIAGNOSIS — J3089 Other allergic rhinitis: Secondary | ICD-10-CM | POA: Diagnosis not present

## 2016-10-18 DIAGNOSIS — J301 Allergic rhinitis due to pollen: Secondary | ICD-10-CM | POA: Diagnosis not present

## 2016-10-30 DIAGNOSIS — J3081 Allergic rhinitis due to animal (cat) (dog) hair and dander: Secondary | ICD-10-CM | POA: Diagnosis not present

## 2016-10-30 DIAGNOSIS — J3089 Other allergic rhinitis: Secondary | ICD-10-CM | POA: Diagnosis not present

## 2016-10-30 DIAGNOSIS — J301 Allergic rhinitis due to pollen: Secondary | ICD-10-CM | POA: Diagnosis not present

## 2016-11-08 DIAGNOSIS — J3089 Other allergic rhinitis: Secondary | ICD-10-CM | POA: Diagnosis not present

## 2016-11-08 DIAGNOSIS — J301 Allergic rhinitis due to pollen: Secondary | ICD-10-CM | POA: Diagnosis not present

## 2016-11-08 DIAGNOSIS — J3081 Allergic rhinitis due to animal (cat) (dog) hair and dander: Secondary | ICD-10-CM | POA: Diagnosis not present

## 2016-11-22 DIAGNOSIS — J3089 Other allergic rhinitis: Secondary | ICD-10-CM | POA: Diagnosis not present

## 2016-11-22 DIAGNOSIS — J301 Allergic rhinitis due to pollen: Secondary | ICD-10-CM | POA: Diagnosis not present

## 2016-11-22 DIAGNOSIS — J3081 Allergic rhinitis due to animal (cat) (dog) hair and dander: Secondary | ICD-10-CM | POA: Diagnosis not present

## 2016-11-29 DIAGNOSIS — J3089 Other allergic rhinitis: Secondary | ICD-10-CM | POA: Diagnosis not present

## 2016-11-29 DIAGNOSIS — J301 Allergic rhinitis due to pollen: Secondary | ICD-10-CM | POA: Diagnosis not present

## 2016-11-29 DIAGNOSIS — J3081 Allergic rhinitis due to animal (cat) (dog) hair and dander: Secondary | ICD-10-CM | POA: Diagnosis not present

## 2016-12-18 DIAGNOSIS — J3089 Other allergic rhinitis: Secondary | ICD-10-CM | POA: Diagnosis not present

## 2016-12-18 DIAGNOSIS — J301 Allergic rhinitis due to pollen: Secondary | ICD-10-CM | POA: Diagnosis not present

## 2016-12-18 DIAGNOSIS — J3081 Allergic rhinitis due to animal (cat) (dog) hair and dander: Secondary | ICD-10-CM | POA: Diagnosis not present

## 2016-12-25 DIAGNOSIS — J3081 Allergic rhinitis due to animal (cat) (dog) hair and dander: Secondary | ICD-10-CM | POA: Diagnosis not present

## 2016-12-25 DIAGNOSIS — J301 Allergic rhinitis due to pollen: Secondary | ICD-10-CM | POA: Diagnosis not present

## 2016-12-25 DIAGNOSIS — J3089 Other allergic rhinitis: Secondary | ICD-10-CM | POA: Diagnosis not present

## 2017-01-01 DIAGNOSIS — J3081 Allergic rhinitis due to animal (cat) (dog) hair and dander: Secondary | ICD-10-CM | POA: Diagnosis not present

## 2017-01-01 DIAGNOSIS — J301 Allergic rhinitis due to pollen: Secondary | ICD-10-CM | POA: Diagnosis not present

## 2017-01-01 DIAGNOSIS — J3089 Other allergic rhinitis: Secondary | ICD-10-CM | POA: Diagnosis not present

## 2017-01-22 DIAGNOSIS — J3089 Other allergic rhinitis: Secondary | ICD-10-CM | POA: Diagnosis not present

## 2017-01-22 DIAGNOSIS — J3081 Allergic rhinitis due to animal (cat) (dog) hair and dander: Secondary | ICD-10-CM | POA: Diagnosis not present

## 2017-01-22 DIAGNOSIS — J301 Allergic rhinitis due to pollen: Secondary | ICD-10-CM | POA: Diagnosis not present

## 2017-01-29 DIAGNOSIS — J301 Allergic rhinitis due to pollen: Secondary | ICD-10-CM | POA: Diagnosis not present

## 2017-01-29 DIAGNOSIS — J3089 Other allergic rhinitis: Secondary | ICD-10-CM | POA: Diagnosis not present

## 2017-01-29 DIAGNOSIS — J3081 Allergic rhinitis due to animal (cat) (dog) hair and dander: Secondary | ICD-10-CM | POA: Diagnosis not present

## 2017-02-07 DIAGNOSIS — J3081 Allergic rhinitis due to animal (cat) (dog) hair and dander: Secondary | ICD-10-CM | POA: Diagnosis not present

## 2017-02-07 DIAGNOSIS — J3089 Other allergic rhinitis: Secondary | ICD-10-CM | POA: Diagnosis not present

## 2017-02-07 DIAGNOSIS — J301 Allergic rhinitis due to pollen: Secondary | ICD-10-CM | POA: Diagnosis not present

## 2017-02-21 DIAGNOSIS — J3081 Allergic rhinitis due to animal (cat) (dog) hair and dander: Secondary | ICD-10-CM | POA: Diagnosis not present

## 2017-02-21 DIAGNOSIS — J301 Allergic rhinitis due to pollen: Secondary | ICD-10-CM | POA: Diagnosis not present

## 2017-02-21 DIAGNOSIS — J3089 Other allergic rhinitis: Secondary | ICD-10-CM | POA: Diagnosis not present

## 2017-02-26 DIAGNOSIS — J301 Allergic rhinitis due to pollen: Secondary | ICD-10-CM | POA: Diagnosis not present

## 2017-02-26 DIAGNOSIS — J3081 Allergic rhinitis due to animal (cat) (dog) hair and dander: Secondary | ICD-10-CM | POA: Diagnosis not present

## 2017-02-26 DIAGNOSIS — J3089 Other allergic rhinitis: Secondary | ICD-10-CM | POA: Diagnosis not present

## 2017-03-12 DIAGNOSIS — J301 Allergic rhinitis due to pollen: Secondary | ICD-10-CM | POA: Diagnosis not present

## 2017-03-12 DIAGNOSIS — J3089 Other allergic rhinitis: Secondary | ICD-10-CM | POA: Diagnosis not present

## 2017-03-12 DIAGNOSIS — J3081 Allergic rhinitis due to animal (cat) (dog) hair and dander: Secondary | ICD-10-CM | POA: Diagnosis not present

## 2017-03-15 DIAGNOSIS — J301 Allergic rhinitis due to pollen: Secondary | ICD-10-CM | POA: Diagnosis not present

## 2017-03-15 DIAGNOSIS — J3081 Allergic rhinitis due to animal (cat) (dog) hair and dander: Secondary | ICD-10-CM | POA: Diagnosis not present

## 2017-03-18 DIAGNOSIS — J3089 Other allergic rhinitis: Secondary | ICD-10-CM | POA: Diagnosis not present

## 2017-03-26 DIAGNOSIS — J3089 Other allergic rhinitis: Secondary | ICD-10-CM | POA: Diagnosis not present

## 2017-03-26 DIAGNOSIS — J301 Allergic rhinitis due to pollen: Secondary | ICD-10-CM | POA: Diagnosis not present

## 2017-03-26 DIAGNOSIS — J3081 Allergic rhinitis due to animal (cat) (dog) hair and dander: Secondary | ICD-10-CM | POA: Diagnosis not present

## 2017-04-02 DIAGNOSIS — J3089 Other allergic rhinitis: Secondary | ICD-10-CM | POA: Diagnosis not present

## 2017-04-02 DIAGNOSIS — J3081 Allergic rhinitis due to animal (cat) (dog) hair and dander: Secondary | ICD-10-CM | POA: Diagnosis not present

## 2017-04-02 DIAGNOSIS — J301 Allergic rhinitis due to pollen: Secondary | ICD-10-CM | POA: Diagnosis not present

## 2017-04-09 DIAGNOSIS — J3081 Allergic rhinitis due to animal (cat) (dog) hair and dander: Secondary | ICD-10-CM | POA: Diagnosis not present

## 2017-04-09 DIAGNOSIS — J3089 Other allergic rhinitis: Secondary | ICD-10-CM | POA: Diagnosis not present

## 2017-04-09 DIAGNOSIS — J301 Allergic rhinitis due to pollen: Secondary | ICD-10-CM | POA: Diagnosis not present

## 2017-04-16 DIAGNOSIS — J3089 Other allergic rhinitis: Secondary | ICD-10-CM | POA: Diagnosis not present

## 2017-04-16 DIAGNOSIS — J301 Allergic rhinitis due to pollen: Secondary | ICD-10-CM | POA: Diagnosis not present

## 2017-04-16 DIAGNOSIS — J3081 Allergic rhinitis due to animal (cat) (dog) hair and dander: Secondary | ICD-10-CM | POA: Diagnosis not present

## 2017-04-19 IMAGING — US US EXTREM LOW VENOUS*R*
1 series · 13 of 24 positions shown · non-contrast
Comparison: None.

CLINICAL DATA: Right lower extremity (calf) pain and edema for the
past 5 days. Evaluate for DVT.



[Series 1: us extrem low venous*right* · 0.08mm/px · 13 of 27 slices shown]
[im 1/27]
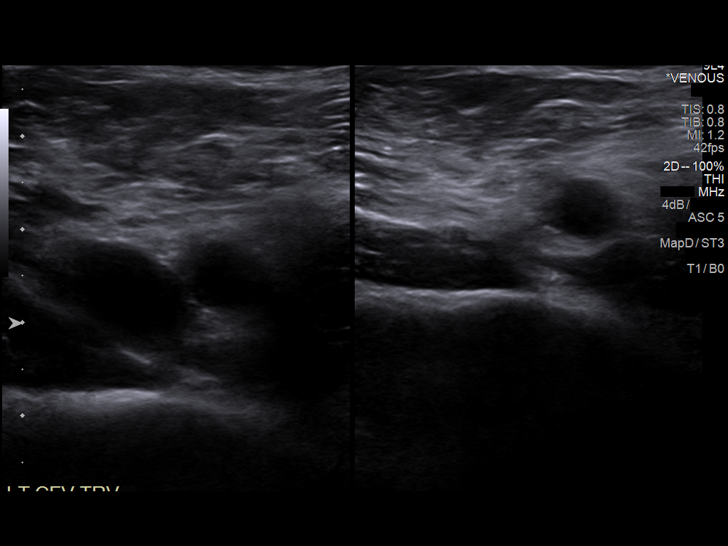
[im 3/27]
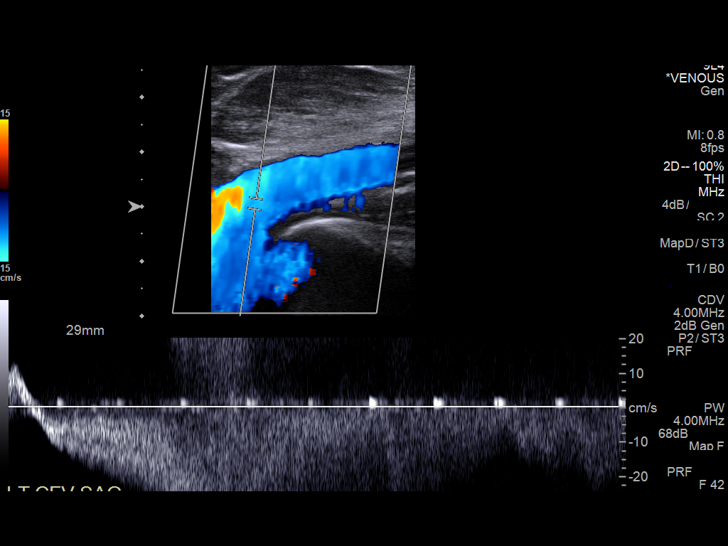
[im 5/27]
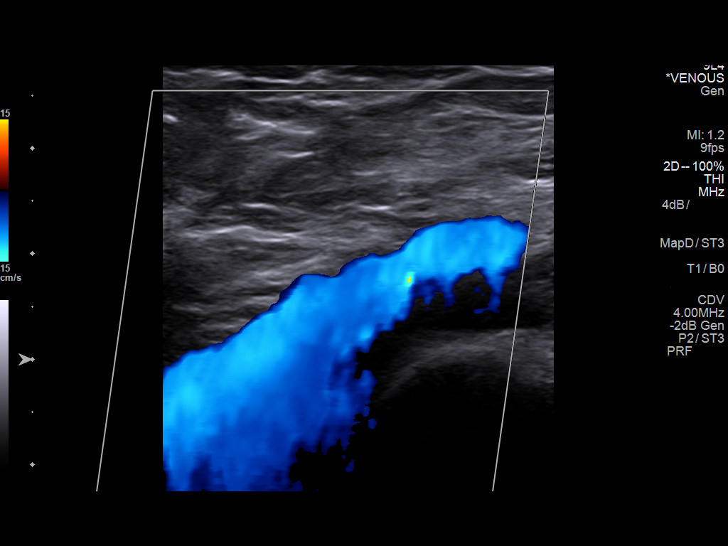
[im 7/27]
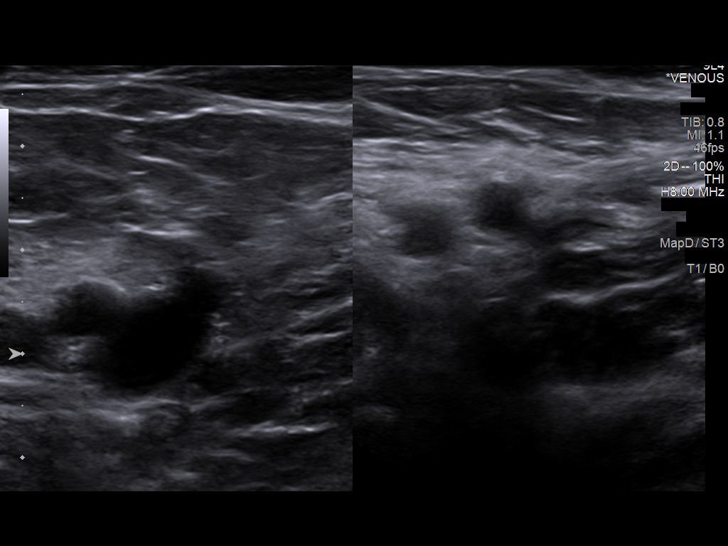
[im 10/27]
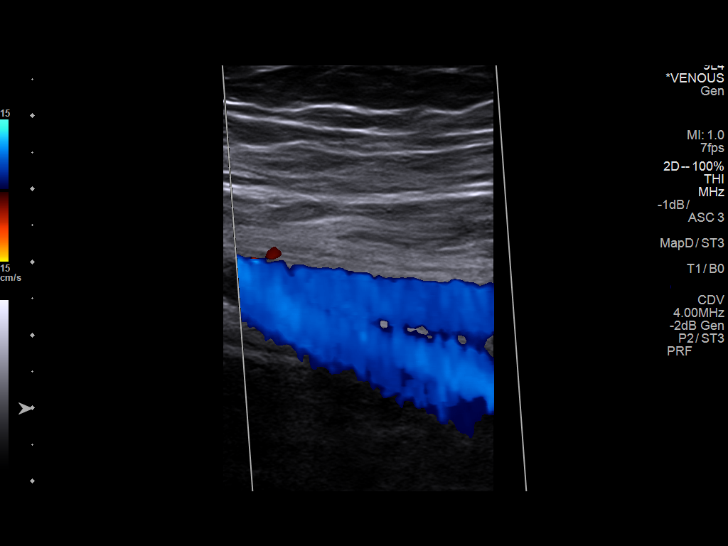
[im 12/27]
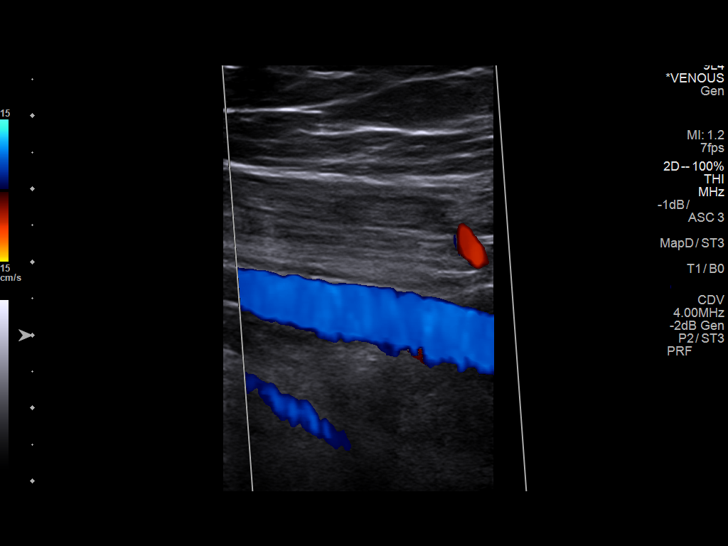
[im 14/27]
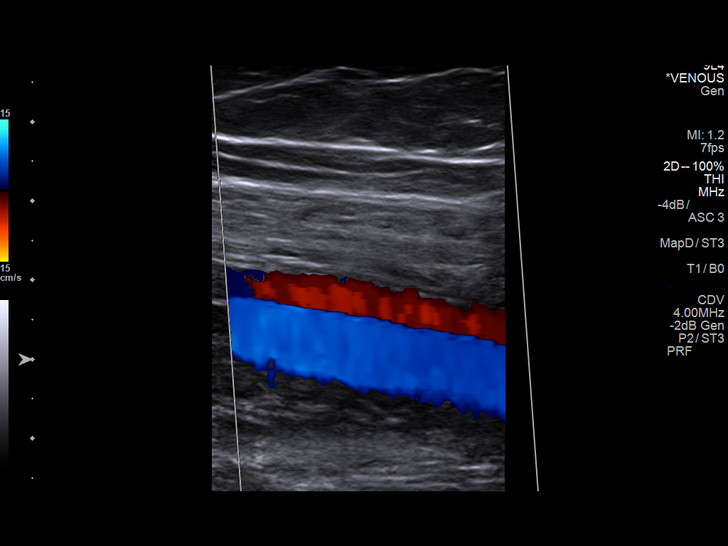
[im 15/27]
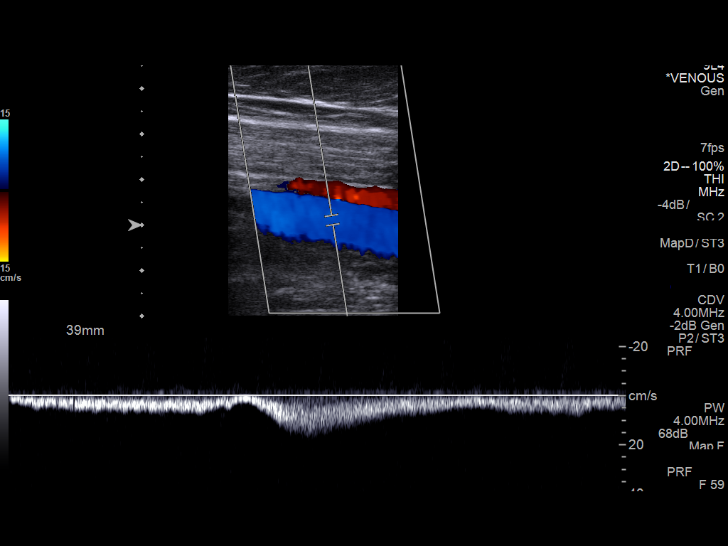
[im 17/27]
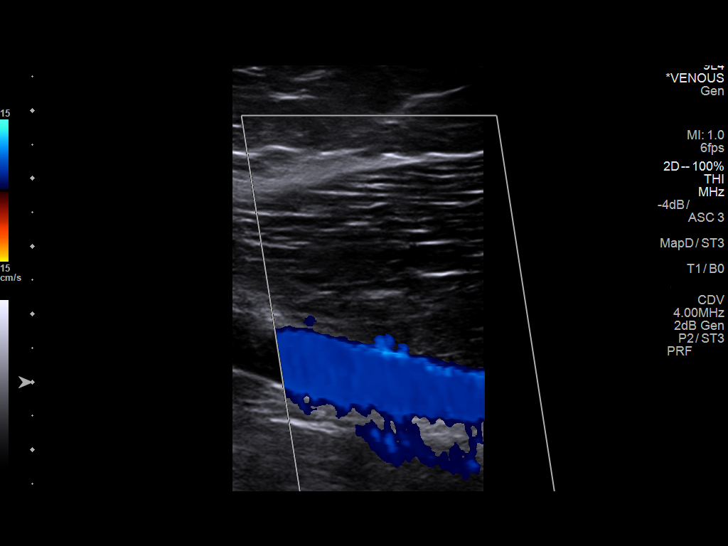
[im 20/27]
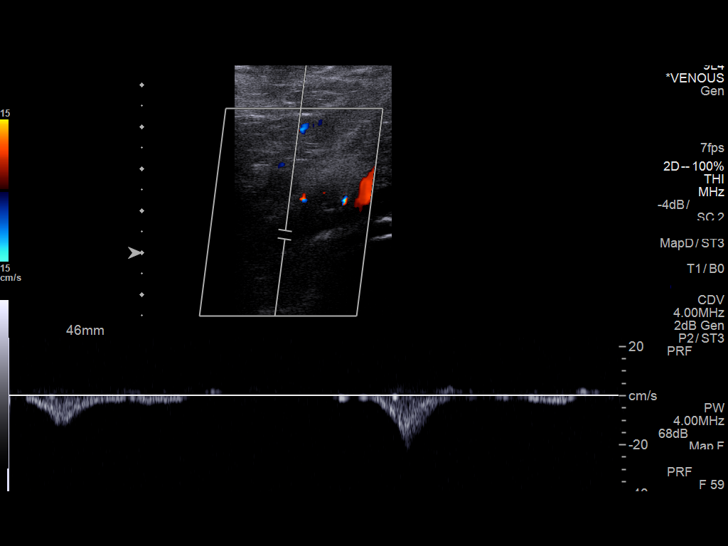
[im 22/27]
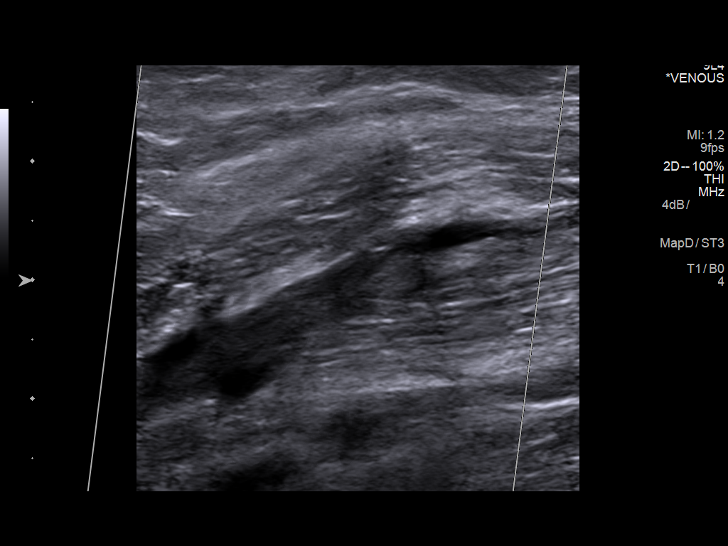
[im 24/27]
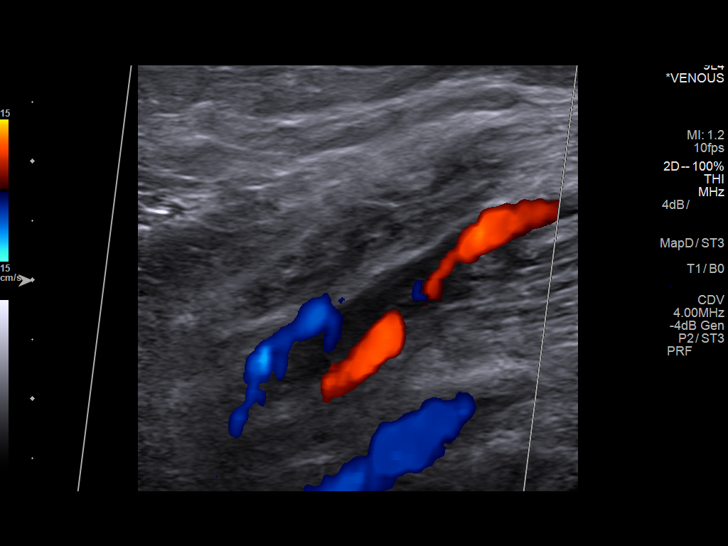
[im 27/27]
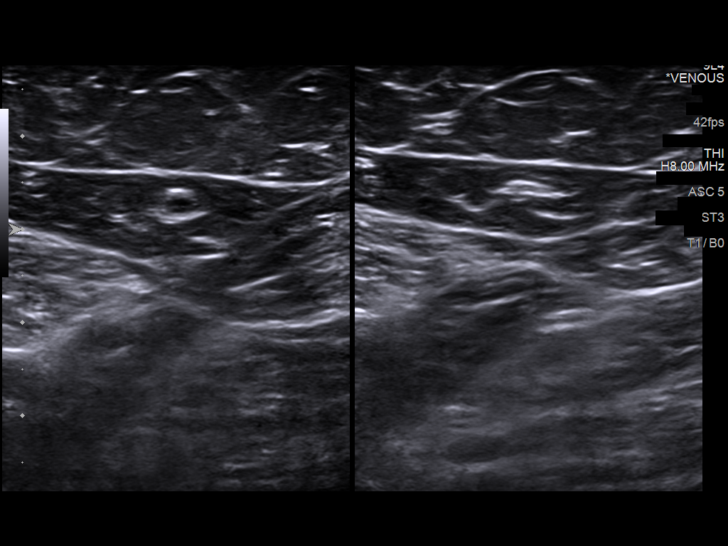

[13 of 24 positions shown; findings below may reference images not displayed]

FINDINGS: Contralateral Common Femoral Vein: Respiratory phasicity is normal
and symmetric with the symptomatic side. No evidence of thrombus.
Normal compressibility.

Common Femoral Vein: No evidence of thrombus. Normal
compressibility, respiratory phasicity and response to augmentation.

Saphenofemoral Junction: No evidence of thrombus. Normal
compressibility and flow on color Doppler imaging.

Profunda Femoral Vein: No evidence of thrombus. Normal
compressibility and flow on color Doppler imaging.

Femoral Vein: No evidence of thrombus. Normal compressibility,
respiratory phasicity and response to augmentation.

Popliteal Vein: No evidence of thrombus. Normal compressibility,
respiratory phasicity and response to augmentation.

Calf Veins: No evidence of thrombus. Normal compressibility and flow
on color Doppler imaging.

Superficial Great Saphenous Vein: No evidence of thrombus. Normal
compressibility and flow on color Doppler imaging.

Venous Reflux:  None.

Other Findings: There is occlusive thrombus within the right lesser
saphenous vein (images 22, 23 and 25) without definitive extension
to the deep venous system of the right lower extremity.
IMPRESSION: Examination is positive for occlusive superficial thrombophlebitis
within the right lesser saphenous vein without definitive extension
to the deep venous system of the right lower extremity.

## 2017-04-25 DIAGNOSIS — J3081 Allergic rhinitis due to animal (cat) (dog) hair and dander: Secondary | ICD-10-CM | POA: Diagnosis not present

## 2017-04-25 DIAGNOSIS — J301 Allergic rhinitis due to pollen: Secondary | ICD-10-CM | POA: Diagnosis not present

## 2017-04-25 DIAGNOSIS — J3089 Other allergic rhinitis: Secondary | ICD-10-CM | POA: Diagnosis not present

## 2017-05-07 DIAGNOSIS — J301 Allergic rhinitis due to pollen: Secondary | ICD-10-CM | POA: Diagnosis not present

## 2017-05-07 DIAGNOSIS — J3089 Other allergic rhinitis: Secondary | ICD-10-CM | POA: Diagnosis not present

## 2017-05-07 DIAGNOSIS — J3081 Allergic rhinitis due to animal (cat) (dog) hair and dander: Secondary | ICD-10-CM | POA: Diagnosis not present

## 2017-05-14 DIAGNOSIS — J301 Allergic rhinitis due to pollen: Secondary | ICD-10-CM | POA: Diagnosis not present

## 2017-05-14 DIAGNOSIS — J3089 Other allergic rhinitis: Secondary | ICD-10-CM | POA: Diagnosis not present

## 2017-05-14 DIAGNOSIS — J3081 Allergic rhinitis due to animal (cat) (dog) hair and dander: Secondary | ICD-10-CM | POA: Diagnosis not present

## 2017-05-21 DIAGNOSIS — J3081 Allergic rhinitis due to animal (cat) (dog) hair and dander: Secondary | ICD-10-CM | POA: Diagnosis not present

## 2017-05-21 DIAGNOSIS — J3089 Other allergic rhinitis: Secondary | ICD-10-CM | POA: Diagnosis not present

## 2017-05-21 DIAGNOSIS — J301 Allergic rhinitis due to pollen: Secondary | ICD-10-CM | POA: Diagnosis not present

## 2017-05-31 DIAGNOSIS — J301 Allergic rhinitis due to pollen: Secondary | ICD-10-CM | POA: Diagnosis not present

## 2017-05-31 DIAGNOSIS — J4520 Mild intermittent asthma, uncomplicated: Secondary | ICD-10-CM | POA: Diagnosis not present

## 2017-05-31 DIAGNOSIS — J3081 Allergic rhinitis due to animal (cat) (dog) hair and dander: Secondary | ICD-10-CM | POA: Diagnosis not present

## 2017-05-31 DIAGNOSIS — J3089 Other allergic rhinitis: Secondary | ICD-10-CM | POA: Diagnosis not present

## 2017-06-04 DIAGNOSIS — J3081 Allergic rhinitis due to animal (cat) (dog) hair and dander: Secondary | ICD-10-CM | POA: Diagnosis not present

## 2017-06-04 DIAGNOSIS — J301 Allergic rhinitis due to pollen: Secondary | ICD-10-CM | POA: Diagnosis not present

## 2017-06-04 DIAGNOSIS — J3089 Other allergic rhinitis: Secondary | ICD-10-CM | POA: Diagnosis not present

## 2017-06-06 DIAGNOSIS — J3081 Allergic rhinitis due to animal (cat) (dog) hair and dander: Secondary | ICD-10-CM | POA: Diagnosis not present

## 2017-06-06 DIAGNOSIS — J3089 Other allergic rhinitis: Secondary | ICD-10-CM | POA: Diagnosis not present

## 2017-06-06 DIAGNOSIS — J301 Allergic rhinitis due to pollen: Secondary | ICD-10-CM | POA: Diagnosis not present

## 2017-06-25 DIAGNOSIS — J301 Allergic rhinitis due to pollen: Secondary | ICD-10-CM | POA: Diagnosis not present

## 2017-06-25 DIAGNOSIS — J3089 Other allergic rhinitis: Secondary | ICD-10-CM | POA: Diagnosis not present

## 2017-06-25 DIAGNOSIS — J3081 Allergic rhinitis due to animal (cat) (dog) hair and dander: Secondary | ICD-10-CM | POA: Diagnosis not present

## 2017-07-02 DIAGNOSIS — J3089 Other allergic rhinitis: Secondary | ICD-10-CM | POA: Diagnosis not present

## 2017-07-02 DIAGNOSIS — J3081 Allergic rhinitis due to animal (cat) (dog) hair and dander: Secondary | ICD-10-CM | POA: Diagnosis not present

## 2017-07-02 DIAGNOSIS — J301 Allergic rhinitis due to pollen: Secondary | ICD-10-CM | POA: Diagnosis not present

## 2017-07-03 ENCOUNTER — Encounter: Payer: Self-pay | Admitting: Adult Health

## 2017-07-03 ENCOUNTER — Ambulatory Visit: Payer: BLUE CROSS/BLUE SHIELD

## 2017-07-03 ENCOUNTER — Ambulatory Visit: Payer: BLUE CROSS/BLUE SHIELD | Admitting: Adult Health

## 2017-07-03 VITALS — BP 107/72 | HR 83 | Ht 64.0 in | Wt 184.4 lb

## 2017-07-03 DIAGNOSIS — M25531 Pain in right wrist: Secondary | ICD-10-CM

## 2017-07-03 MED ORDER — PREDNISONE 20 MG PO TABS: ORAL_TABLET | ORAL | 0 refills | Status: DC

## 2017-07-03 MED ORDER — MELOXICAM 7.5 MG PO TABS: 7.5000 mg | ORAL_TABLET | Freq: Two times a day (BID) | ORAL | 0 refills | Status: DC | PRN

## 2017-07-03 NOTE — Assessment & Plan Note (Addendum)
Xray:  IMPRESSION: No fracture or dislocation. No appreciable arthropathy.  Rest, ice, splint right wrist Prednisone taper as directed. Meloxicam 7.5mg  twice a day with food as needed for pain. Please call clinic in 2 weeks and let us know how you are feeling. If not improved will refer to physical therapy.

## 2017-07-03 NOTE — Patient Instructions (Signed)
Wrist Sprain, Adult A wrist sprain is a stretch or tear in the strong, fibrous tissues (ligaments) that connect your wrist bones. There are three types of wrist sprains:  Grade 1. In this type of sprain, the ligament is stretched more than normal.  Grade 2. In this type of sprain, the ligament is partially torn. You may be able to move your wrist, but not very much.  Grade 3. In this type of sprain, the ligament or muscle is completely torn. You may find it difficult or extremely painful to move your wrist even a little.  What are the causes? A wrist sprain can be caused by using the wrist too much during sports, exercise, or at work. It can also happen with a fall or during an accident. What increases the risk? This condition is more likely to occur in people:  With a previous wrist or arm injury.  With poor wrist strength and flexibility.  Who play contact sports, such as football or soccer.  Who play sports that may result in a fall, such as skateboarding, biking, skiing, or snowboarding.  Who do not exercise regularly.  Who use exercise equipment that does not fit well.  What are the signs or symptoms? Symptoms of this condition include:  Pain in the wrist, arm, or hand.  Swelling or bruised skin near the wrist, hand, or arm. The skin may look yellow or kind of blue.  Stiffness or trouble moving the hand.  Hearing a pop or feeling a tear at the time of the injury.  A warm feeling in the skin around the wrist.  How is this diagnosed? This condition is diagnosed with a physical exam. Sometimes an X-ray is taken to make sure a bone did not break. If your health care provider thinks that you tore a ligament, he or she may order an MRI of your wrist. How is this treated? This condition is treated by resting and applying ice to your wrist. Additional treatment may include:  Medicine for pain and inflammation.  A splint to keep your wrist still (immobilized).  Exercises  to strengthen and stretch your wrist.  Surgery. This may be done if the ligament is completely torn.  Follow these instructions at home: If you have a splint:   Do not put pressure on any part of the splint until it is fully hardened. This may take several hours.  Wear the splint as told by your health care provider. Remove it only as told by your health care provider.  Loosen the splint if your fingers tingle, become numb, or turn cold and blue.  If your splint is not waterproof: ? Do not let it get wet. ? Cover it with a watertight covering when you take a bath or a shower.  Keep the splint clean. Managing pain, stiffness, and swelling   If directed, put ice on the injured area. ? If you have a removable splint, remove it as told by your health care provider. ? Put ice in a plastic bag. ? Place a towel between your skin and the bag or between the splint and the bag. ? Leave the ice on for 20 minutes, 2-3 times per day.  Move your fingers often to avoid stiffness and to lessen swelling.  Raise (elevate) the injured area above the level of your heart while you are sitting or lying down. Activity  Rest your wrist. Do not do things that cause pain.  Return to your normal activities as told by   your health care provider. Ask your health care provider what activities are safe for you.  Do exercises as told by your health care provider. General instructions  Take over-the-counter and prescription medicines only as told by your health care provider.  Do not use any products that contain nicotine or tobacco, such as cigarettes and e-cigarettes. These can delay healing. If you need help quitting, ask your health care provider.  Ask your health care provider when it is safe to drive if you have a splint.  Keep all follow-up visits as told by your health care provider. This is important. Contact a health care provider if:  Your pain, bruising, or swelling gets worse.  Your  skin becomes red, gets a rash, or has open sores.  Your pain does not get better or it gets worse. Get help right away if:  You have a new or sudden sharp pain in the hand, arm, or wrist.  You have tingling or numbness in your hand.  Your fingers turn white, very red, or cold and blue.  You cannot move your fingers. This information is not intended to replace advice given to you by your health care provider. Make sure you discuss any questions you have with your health care provider. Document Released: 02/05/2014 Document Revised: 12/31/2015 Document Reviewed: 12/22/2015 Elsevier Interactive Patient Education  2018 [employer]    Wrist Pain, Adult There are many things that can cause wrist pain. Some common causes include:  An injury to the wrist area.  Overuse of the joint.  A condition that causes too much pressure to be put on a nerve in the wrist (carpal tunnel syndrome).  Wear and tear of the joints that happens as a person gets older (osteoarthritis).  Other types of arthritis.  Sometimes, the cause of wrist pain is not known. Often, the pain goes away when you follow your doctor's instructions for helping pain at home, such as resting or icing your wrist. If your wrist pain does not go away, it is important to tell your doctor. Follow these instructions at home:  Rest the wrist area for 48 hours or more, or as long as told by your doctor.  If a splint or elastic bandage has been put on your wrist, use it as told by your doctor. ? Take off the splint or bandage only as told by your doctor. ? Loosen the splint or bandage if your fingers tingle, lose feeling (get numb), or turn cold or blue.  If directed, apply ice to the injured area: ? If you have a removable splint or elastic bandage, remove it as told by your doctor. ? Put ice in a plastic bag. ? Place a towel between your skin and the bag or between your splint or bandage and the bag. ? Leave the ice on for 20  minutes, 2-3 times a day.  Keep your arm raised (elevated) above the level of your heart while you are sitting or lying down.  Take over-the-counter and prescription medicines only as told by your doctor.  Keep all follow-up visits as told by your doctor. This is important. Contact a doctor if:  You have a sudden sharp pain in the wrist, hand, or arm that is different or new.  The swelling or bruising on your wrist or hand gets worse.  Your skin becomes red, gets a rash, or has open sores.  Your pain does not get better or it gets worse. Get help right away if:  You  lose feeling in your fingers or hand.  Your fingers turn white, very red, or cold and blue.  You cannot move your fingers.  You have a fever or chills. This information is not intended to replace advice given to you by your health care provider. Make sure you discuss any questions you have with your health care provider. Document Released: 11/21/2007 Document Revised: 12/29/2015 Document Reviewed: 12/22/2015 Elsevier Interactive Patient Education  2018 Elsevier Inc.  Rest, ice, splint right wrist Prednisone taper as directed. Meloxicam 7.5mg  twice a day with food as needed for pain. Please call clinic in 2 weeks and let us know how you are feeling. If not improved will refer to physical therapy. FEEL BETTER!

## 2017-07-03 NOTE — Progress Notes (Addendum)
Subjective:    Patient ID: Karen Frazier, female    DOB: [birth year], 37 y.o.   MRN: [id number]  HPI:  Ms. Guiffre presents with R wrist pain. 4-5 months ago she was carrying sheet rock that weighed > 50 llbs for 2 days during home reno. R wrist pain was present for 2 months and resolved. Last week pain spontaneously developed and is now: 1/10 at rest, 8/10 with use Pain described as "shooting" and starts at head of ulnar and radiates to mid shaft of ulna. She is R hand dominant and denies acute injury/acccident prior to onset of this occurrence of pain. She constantly uses her hands- she is a Production designer, theatre/television/film.  Patient Care Team    Relationship Specialty Notifications Start End  William Hamburger D, NP PCP - General Family Medicine  07/31/16   Ginette Otto, [facility]    07/03/17     Patient Active Problem List   Diagnosis Date Noted  . Right wrist pain 07/03/2017  . Constipation 07/31/2016  . Thrombophlebitis of superficial veins of right lower extremity 04/30/2016  . 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 Visit for preventive health examination 08/04/2014  . 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 Allergic asthma 08/04/2014     Past Medical History:  Diagnosis Date  . Acute superficial venous thrombosis of right lower extremity   . Asthma   . Environmental allergies   . History of chicken pox   . Seasonal allergies   . UTI (lower urinary tract infection)      Past Surgical History:  Procedure Laterality Date  . NO PAST SURGERIES       Family History  Problem Relation Age of Onset  . Hyperlipidemia Father        Living  . Hypertension Father   . Diabetes Mother        Living  . Hypertension Mother   . Allergy (severe) Mother        Alpha-gal  . Other Mother        IVP Dye  . Heart disease Maternal Grandfather   . COPD Paternal Grandfather   . Healthy Sister        x1  . Allergies Daughter        x1  . Healthy Daughter        x2  .  Diabetes Maternal Aunt   . Hyperlipidemia Maternal Aunt   . Hypertension Maternal Aunt   . Alcohol abuse Maternal Uncle   . Heart attack Maternal Uncle   . Hyperlipidemia Maternal Uncle   . Hypertension Maternal Uncle      Social History   Substance and Sexual Activity  Drug Use No     Social History   Substance and Sexual Activity  Alcohol Use Yes  . Alcohol/week: 0.6 oz  . Types: 1 Standard drinks or equivalent per week     Social History   Tobacco Use  Smoking Status Never Smoker  Smokeless Tobacco Never Used     Outpatient Encounter Medications as of 07/03/2017  Medication Sig  . albuterol (PROVENTIL HFA;VENTOLIN HFA) 108 (90 BASE) MCG/ACT inhaler Inhale 1-2 puffs into the lungs every 6 (six) hours as needed for wheezing or shortness of breath.  . EPINEPHrine 0.3 mg/0.3 mL IJ SOAJ injection Inject into the muscle once. Reported on 07/15/2015  . montelukast (SINGULAIR) 10 MG tablet Take 10 mg by mouth daily as needed.  . triamcinolone ointment (KENALOG) 0.1 % Apply 1 application topically as needed.  . [  DISCONTINUED] Vitamin D, Ergocalciferol, (DRISDOL) 50000 units CAPS capsule Take 1 capsule (50,000 Units total) by mouth every 7 (seven) days.  . meloxicam (MOBIC) 7.5 MG tablet Take 1 tablet (7.5 mg total) by mouth 2 (two) times daily as needed for pain.  . predniSONE (DELTASONE) 20 MG tablet 1 tab by mouth every 12 hrs for first three days, then 1 tab daily for 3 days   No facility-administered encounter medications on file as of 07/03/2017.     Allergies: Gelatin and Other  Body mass index is 31.65 kg/m.  Blood pressure 107/72, pulse 83, height 5\' 4"  (1.626 m), weight 184 lb 6.4 oz (83.6 kg), last menstrual period 06/19/2017, SpO2 97 %.     Review of Systems  Constitutional: Positive for fatigue. Negative for activity change, appetite change, chills, diaphoresis, fever and unexpected weight change.  Respiratory: Negative for cough, chest tightness,  shortness of breath and wheezing.   Cardiovascular: Negative for chest pain, palpitations and leg swelling.  Musculoskeletal: Positive for arthralgias and myalgias.  Hematological: Does not bruise/bleed easily.       Objective:   Physical Exam  Constitutional: She appears well-developed and well-nourished. No distress.  Musculoskeletal: She exhibits tenderness. She exhibits no edema or deformity.       Right elbow: Normal.      Right wrist: She exhibits decreased range of motion, tenderness, bony tenderness and crepitus. She exhibits no swelling, no deformity and no laceration.       Left wrist: Normal.  Strong bil radial pulses Brisk cap refill  Skin: Skin is warm and dry. No rash noted. She is not diaphoretic. No erythema. No pallor.  Psychiatric: She has a normal mood and affect. Her behavior is normal. Judgment and thought content normal.  Nursing note and vitals reviewed.         Assessment & Plan:   1. Right wrist pain     Right wrist pain Xray:  IMPRESSION: No fracture or dislocation. No appreciable arthropathy.  Rest, ice, splint right wrist Prednisone taper as directed. Meloxicam 7.5mg  twice a day with food as needed for pain. Please call clinic in 2 weeks and let us know how you are feeling. If not improved will refer to physical therapy.    FOLLOW-UP:  Return if symptoms worsen or fail to improve.

## 2017-07-11 DIAGNOSIS — J3081 Allergic rhinitis due to animal (cat) (dog) hair and dander: Secondary | ICD-10-CM | POA: Diagnosis not present

## 2017-07-11 DIAGNOSIS — J3089 Other allergic rhinitis: Secondary | ICD-10-CM | POA: Diagnosis not present

## 2017-07-11 DIAGNOSIS — J301 Allergic rhinitis due to pollen: Secondary | ICD-10-CM | POA: Diagnosis not present

## 2017-07-25 ENCOUNTER — Other Ambulatory Visit: Payer: Self-pay

## 2017-07-25 ENCOUNTER — Other Ambulatory Visit: Payer: BLUE CROSS/BLUE SHIELD

## 2017-07-25 DIAGNOSIS — Z1329 Encounter for screening for other suspected endocrine disorder: Secondary | ICD-10-CM

## 2017-07-25 DIAGNOSIS — Z131 Encounter for screening for diabetes mellitus: Secondary | ICD-10-CM

## 2017-07-25 DIAGNOSIS — E559 Vitamin D deficiency, unspecified: Secondary | ICD-10-CM

## 2017-07-25 DIAGNOSIS — Z13220 Encounter for screening for lipoid disorders: Secondary | ICD-10-CM | POA: Diagnosis not present

## 2017-07-25 DIAGNOSIS — Z0000 Encounter for general adult medical examination without abnormal findings: Secondary | ICD-10-CM | POA: Diagnosis not present

## 2017-07-25 DIAGNOSIS — D649 Anemia, unspecified: Secondary | ICD-10-CM

## 2017-07-25 DIAGNOSIS — J3081 Allergic rhinitis due to animal (cat) (dog) hair and dander: Secondary | ICD-10-CM | POA: Diagnosis not present

## 2017-07-25 DIAGNOSIS — J3089 Other allergic rhinitis: Secondary | ICD-10-CM | POA: Diagnosis not present

## 2017-07-25 DIAGNOSIS — J301 Allergic rhinitis due to pollen: Secondary | ICD-10-CM | POA: Diagnosis not present

## 2017-07-26 LAB — COMPREHENSIVE METABOLIC PANEL
ALT: 19 IU/L (ref 0–32)
AST: 18 IU/L (ref 0–40)
Albumin/Globulin Ratio: 1.9 (ref 1.2–2.2)
Albumin: 4.4 g/dL (ref 3.5–5.5)
Alkaline Phosphatase: 50 IU/L (ref 39–117)
BUN/Creatinine Ratio: 12 (ref 9–23)
BUN: 9 mg/dL (ref 6–20)
Bilirubin Total: 0.6 mg/dL (ref 0.0–1.2)
CO2: 21 mmol/L (ref 20–29)
Calcium: 9.1 mg/dL (ref 8.7–10.2)
Chloride: 105 mmol/L (ref 96–106)
Creatinine, Ser: 0.75 mg/dL (ref 0.57–1.00)
GFR calc Af Amer: 119 mL/min/{1.73_m2} (ref >59)
GFR calc non Af Amer: 103 mL/min/{1.73_m2} (ref >59)
Globulin, Total: 2.3 g/dL (ref 1.5–4.5)
Glucose: 86 mg/dL (ref 65–99)
Potassium: 4 mmol/L (ref 3.5–5.2)
Sodium: 140 mmol/L (ref 134–144)
Total Protein: 6.7 g/dL (ref 6.0–8.5)

## 2017-07-26 LAB — CBC WITH DIFFERENTIAL/PLATELET
Basophils Absolute: 0 10*3/uL (ref 0.0–0.2)
Basos: 1 %
EOS (ABSOLUTE): 0.1 10*3/uL (ref 0.0–0.4)
Eos: 2 %
Hematocrit: 36.9 % (ref 34.0–46.6)
Hemoglobin: 12 g/dL (ref 11.1–15.9)
Immature Grans (Abs): 0 10*3/uL (ref 0.0–0.1)
Immature Granulocytes: 0 %
Lymphocytes Absolute: 2.2 10*3/uL (ref 0.7–3.1)
Lymphs: 43 %
MCH: 30.1 pg (ref 26.6–33.0)
MCHC: 32.5 g/dL (ref 31.5–35.7)
MCV: 93 fL (ref 79–97)
Monocytes Absolute: 0.5 10*3/uL (ref 0.1–0.9)
Monocytes: 10 %
Neutrophils Absolute: 2.3 10*3/uL (ref 1.4–7.0)
Neutrophils: 44 %
Platelets: 319 10*3/uL (ref 150–379)
RBC: 3.99 x10E6/uL (ref 3.77–5.28)
RDW: 14 % (ref 12.3–15.4)
WBC: 5.2 10*3/uL (ref 3.4–10.8)

## 2017-07-26 LAB — LIPID PANEL
Chol/HDL Ratio: 2.9 ratio (ref 0.0–4.4)
Cholesterol, Total: 209 mg/dL — ABNORMAL HIGH (ref 100–199)
HDL: 73 mg/dL (ref >39)
LDL Calculated: 118 mg/dL — ABNORMAL HIGH (ref 0–99)
Triglycerides: 88 mg/dL (ref 0–149)
VLDL Cholesterol Cal: 18 mg/dL (ref 5–40)

## 2017-07-26 LAB — TSH: TSH: 1.68 u[IU]/mL (ref 0.450–4.500)

## 2017-07-26 LAB — HEMOGLOBIN A1C
Est. average glucose Bld gHb Est-mCnc: 111 mg/dL
Hgb A1c MFr Bld: 5.5 % (ref 4.8–5.6)

## 2017-07-26 LAB — VITAMIN D 25 HYDROXY (VIT D DEFICIENCY, FRACTURES): Vit D, 25-Hydroxy: 19.5 ng/mL — ABNORMAL LOW (ref 30.0–100.0)

## 2017-07-29 ENCOUNTER — Other Ambulatory Visit: Payer: Self-pay | Admitting: Adult Health

## 2017-07-29 DIAGNOSIS — E559 Vitamin D deficiency, unspecified: Secondary | ICD-10-CM

## 2017-07-29 MED ORDER — VITAMIN D (ERGOCALCIFEROL) 1.25 MG (50000 UNIT) PO CAPS: 50000.0000 [IU] | ORAL_CAPSULE | ORAL | 0 refills | Status: DC

## 2017-07-31 NOTE — Progress Notes (Signed)
Subjective:    Patient ID: Karen Frazier, female    DOB: [birth year], 37 y.o.   MRN: [id number]  HPI:  08/05/16 OV: Ms.  Baine presents to establish as a new pt.  She is a very pleasant, very healthy mother of three (daughers 10, 9, 7), with the only complaint of chronic constipation that has been occurring > 11 years.  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 She had a RLE DVT a few months ago that was causing RLE pain behind her knee.  [place] at her PCP revealed a blood clot and she took 7 days of ASA therapy and the clot resolved.  She denies any acute pain today. Her Asthma is well managed and she "cannot recall the last time I used my inhaler". Hx of Alpha Galactosidase Deficiency (AKA Fabry Disease)- no current/acute sx's.  08/01/17 OV: Ms. Kropp presents for CPE. Further reviewed RLE Doppler US on 05/04/17 and Notes from Oncologist on 07/10/16.  She had superficial thrombophlebitis NOT a DVT. ASVD 0.3% per 2019 lipid panel She drinks > gallon water/day, eats a diet high in fiber and moves all day- runs an operating farm. 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 She denies abdominal pain or blood in urine/stool. R wrist pain/weakness persists even after she completed Prednisone taper. R wrist pain at rest 1/10, with lifting/use 6/10 She has hx of R ulnar carpal tunnel that was treated with PT and splinting. Reviewed all recent labs, she started rx vit d yesterday.  Healthcare Maintenance: PAP-due  04/2019 Mammogram- not indicated Colonoscopy- not indicated Immunizations- UTD   Patient Care Team    Relationship Specialty Notifications Start End  Julaine Fusi, NP PCP - General Family Medicine  07/31/16   Ginette Otto, [facility]    07/03/17     Patient Active Problem List   Diagnosis Date Noted  . Right wrist pain  07/03/2017  . Constipation 07/31/2016  . Thrombophlebitis of superficial veins of right lower extremity 04/30/2016  . 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 Visit for preventive health examination 08/04/2014  . 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 Allergic asthma 08/04/2014     Past Medical History:  Diagnosis Date  . Acute superficial venous thrombosis of right lower extremity   . Asthma   . Environmental allergies   . History of chicken pox   . Seasonal allergies   . UTI (lower urinary tract infection)      Past Surgical History:  Procedure Laterality Date  . NO PAST SURGERIES       Family History  Problem Relation Age of Onset  . Hyperlipidemia Father        Living  . Hypertension Father   . Diabetes Mother        Living  . Hypertension Mother   . Allergy (severe) Mother        Alpha-gal  . Other Mother        IVP Dye  . Heart disease Maternal Grandfather   . COPD Paternal Grandfather   . Healthy Sister        x1  . Allergies Daughter        x1  . Healthy Daughter        x2  . Diabetes Maternal Aunt   . Hyperlipidemia Maternal Aunt   . Hypertension  Maternal Aunt   . Alcohol abuse Maternal Uncle   . Heart attack Maternal Uncle   . Hyperlipidemia Maternal Uncle   . Hypertension Maternal Uncle      Social History   Substance and Sexual Activity  Drug Use No     Social History   Substance and Sexual Activity  Alcohol Use Yes  . Alcohol/week: 0.6 oz  . Types: 1 Standard drinks or equivalent per week     Social History   Tobacco Use  Smoking Status Never Smoker  Smokeless Tobacco Never Used     Outpatient Encounter Medications as of 08/01/2017  Medication Sig  . albuterol (PROVENTIL HFA;VENTOLIN HFA) 108 (90 BASE) MCG/ACT inhaler Inhale 1-2 puffs into the lungs every 6 (six) hours as needed for wheezing or shortness of breath.  . EPINEPHrine 0.3 mg/0.3 mL IJ SOAJ injection Inject into the muscle once. Reported on 07/15/2015   . montelukast (SINGULAIR) 10 MG tablet Take 10 mg by mouth daily as needed.  . triamcinolone ointment (KENALOG) 0.1 % Apply 1 application topically as needed.  . Vitamin D, Ergocalciferol, (DRISDOL) 50000 units CAPS capsule Take 1 capsule (50,000 Units total) by mouth every 7 (seven) days.  Marland Kitchen. linaclotide (LINZESS) 145 MCG CAPS capsule Take 1 capsule (145 mcg total) by mouth daily before breakfast.  . [DISCONTINUED] meloxicam (MOBIC) 7.5 MG tablet Take 1 tablet (7.5 mg total) by mouth 2 (two) times daily as needed for pain.  . [DISCONTINUED] predniSONE (DELTASONE) 20 MG tablet 1 tab by mouth every 12 hrs for first three days, then 1 tab daily for 3 days   No facility-administered encounter medications on file as of 08/01/2017.     Allergies: Gelatin and Other  Body mass index is 31.4 kg/m.  Blood pressure 119/81, pulse 86, height 5' 4.02" (1.626 m), weight 183 lb (83 kg).   Review of Systems  Constitutional: Negative for activity change, appetite change, chills, diaphoresis, fatigue, fever and unexpected weight change.  HENT: Negative for congestion and postnasal drip.   Eyes: Negative for visual disturbance.  Respiratory: Negative for cough and shortness of breath.   Cardiovascular: Negative for chest pain, palpitations and leg swelling.  Gastrointestinal: Positive for abdominal distention and constipation. Negative for abdominal pain, anal bleeding, blood in stool, diarrhea, nausea, rectal pain and vomiting.       Chronic constipation for > 11 years. Excellent water and fiber consumption. 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 1-2 BMs weekly only when taking OTC Laxative.  Endocrine: Negative for cold intolerance, heat intolerance, polydipsia, polyphagia and polyuria.  Genitourinary: Negative for difficulty urinating and flank pain.  Musculoskeletal: Positive for arthralgias and myalgias. Negative for back pain, gait problem, joint swelling, neck pain and neck  stiffness.  Skin: Negative for color change, pallor, rash and wound.  Neurological: Negative for tremors, syncope, weakness and light-headedness.  Psychiatric/Behavioral: Negative for agitation, behavioral problems and sleep disturbance. The patient is not nervous/anxious.        Objective:   Physical Exam  Constitutional: She is oriented to person, place, and time. She appears well-developed and well-nourished. No distress.  HENT:  Head: Normocephalic and atraumatic.  Right Ear: Hearing, external ear and ear canal normal. Tympanic membrane is not erythematous and not bulging. No decreased hearing is noted.  Left Ear: Hearing, external ear and ear canal normal. Tympanic membrane is not erythematous and not bulging. No decreased hearing is noted.  Nose: Nose normal. Right sinus exhibits no frontal sinus tenderness. Left sinus exhibits no  maxillary sinus tenderness and no frontal sinus tenderness.  Mouth/Throat: Uvula is midline, oropharynx is clear and moist and mucous membranes are normal.  Eyes: Conjunctivae and EOM are normal. Pupils are equal, round, and reactive to light.  Neck: Normal range of motion. Neck supple. No thyromegaly present.  Cardiovascular: Normal rate, regular rhythm, normal heart sounds and intact distal pulses.  No murmur heard. Pulmonary/Chest: Effort normal and breath sounds normal. She has no wheezes. She exhibits no tenderness.  Abdominal: Soft. Bowel sounds are normal. She exhibits no distension and no mass. There is no tenderness. There is no rebound and no guarding.  Minor bloating noted at time of examination.  Musculoskeletal: Normal range of motion. She exhibits edema and tenderness.       Right elbow: Normal.      Right wrist: She exhibits tenderness and swelling. She exhibits normal range of motion.       Left wrist: Normal.  R grip strength < L grip strength Strong radial pulses, brisk cap refill  Lymphadenopathy:    She has no cervical adenopathy.   Neurological: She is alert and oriented to person, place, and time. She has normal reflexes. Coordination normal.  Skin: Skin is warm and dry. No rash noted. She is not diaphoretic. No erythema. No pallor.  Psychiatric: She has a normal mood and affect. Her behavior is normal. Judgment and thought content normal.  Nursing note and vitals reviewed.         Assessment & Plan:   1. Right wrist pain   2. Alpha galactosidase deficiency   3. Chronic idiopathic constipation   4. Visit for preventive health examination     Right wrist pain Prednisone taper completed and only reduced pain/swelling slightly. R grip strength < L grip strength She has hx of R Ulnar carpal tunnel Referral to Orthopedics placed  Alpha galactosidase deficiency Denies acute sx's  Constipation Continue excellent hydration, regular movement, and diet high in fiber Started on Linzess daily Please send MyChart message in 4 weeks on how rx is working.  Visit for preventive health examination Please start once daily Linzess . Continue excellent water intake, healthy eating, and regular movement. Referral to Orthopedics placed, re: Right hand pain/weakness Please contact me via MyChart in 4 weeks to let me know how you are tolerating Linzess. Annual physical with fasting labs.    FOLLOW-UP:  Return in about 1 year (around 08/01/2018) for Fasting Labs, CPE.

## 2017-08-01 ENCOUNTER — Ambulatory Visit (INDEPENDENT_AMBULATORY_CARE_PROVIDER_SITE_OTHER): Payer: BLUE CROSS/BLUE SHIELD | Admitting: Adult Health

## 2017-08-01 VITALS — BP 119/81 | HR 86 | Ht 64.02 in | Wt 183.0 lb

## 2017-08-01 DIAGNOSIS — E8809 Other disorders of plasma-protein metabolism, not elsewhere classified: Secondary | ICD-10-CM

## 2017-08-01 DIAGNOSIS — E756 Lipid storage disorder, unspecified: Secondary | ICD-10-CM | POA: Diagnosis not present

## 2017-08-01 DIAGNOSIS — M25531 Pain in right wrist: Secondary | ICD-10-CM | POA: Diagnosis not present

## 2017-08-01 DIAGNOSIS — K5904 Chronic idiopathic constipation: Secondary | ICD-10-CM

## 2017-08-01 DIAGNOSIS — Z0000 Encounter for general adult medical examination without abnormal findings: Secondary | ICD-10-CM | POA: Diagnosis not present

## 2017-08-01 MED ORDER — LINACLOTIDE 145 MCG PO CAPS: 145.0000 ug | ORAL_CAPSULE | Freq: Every day | ORAL | 1 refills | Status: DC

## 2017-08-01 NOTE — Assessment & Plan Note (Signed)
Denies acute sx's

## 2017-08-01 NOTE — Assessment & Plan Note (Signed)
Continue excellent hydration, regular movement, and diet high in fiber Started on Linzess daily Please send MyChart message in 4 weeks on how rx is working.

## 2017-08-01 NOTE — Assessment & Plan Note (Signed)
Please start once daily Linzess . Continue excellent water intake, healthy eating, and regular movement. Referral to Orthopedics placed, re: Right hand pain/weakness Please contact me via MyChart in 4 weeks to let me know how you are tolerating Linzess. Annual physical with fasting labs.

## 2017-08-01 NOTE — Patient Instructions (Signed)
Constipation, Adult Constipation is when a person has fewer bowel movements in a week than normal, has difficulty having a bowel movement, or has stools that are dry, hard, or larger than normal. Constipation may be caused by an underlying condition. It may become worse with age if a person takes certain medicines and does not take in enough fluids. Follow these instructions at home: Eating and drinking   Eat foods that have a lot of fiber, such as fresh fruits and vegetables, whole grains, and beans.  Limit foods that are high in fat, low in fiber, or overly processed, such as french fries, hamburgers, cookies, candies, and soda.  Drink enough fluid to keep your urine clear or pale yellow. General instructions  Exercise regularly or as told by your health care provider.  Go to the restroom when you have the urge to go. Do not hold it in.  Take over-the-counter and prescription medicines only as told by your health care provider. These include any fiber supplements.  Practice pelvic floor retraining exercises, such as deep breathing while relaxing the lower abdomen and pelvic floor relaxation during bowel movements.  Watch your condition for any changes.  Keep all follow-up visits as told by your health care provider. This is important. Contact a health care provider if:  You have pain that gets worse.  You have a fever.  You do not have a bowel movement after 4 days.  You vomit.  You are not hungry.  You lose weight.  You are bleeding from the anus.  You have thin, pencil-like stools. Get help right away if:  You have a fever and your symptoms suddenly get worse.  You leak stool or have blood in your stool.  Your abdomen is bloated.  You have severe pain in your abdomen.  You feel dizzy or you faint. This information is not intended to replace advice given to you by your health care provider. Make sure you discuss any questions you have with your health care  provider. Document Released: 03/02/2004 Document Revised: 12/23/2015 Document Reviewed: 11/23/2015 Elsevier Interactive Patient Education  2018 [employer]  Please start once daily Linzess 145mcg. Continue excellent water intake, healthy eating, and regular movement. Referral to Orthopedics placed, re: Right hand pain/weakness Please contact me via MyChart in 4 weeks to let me know how you are tolerating Linzess. Annual physical with fasting labs. NICE TO SEE YOU!

## 2017-08-01 NOTE — Assessment & Plan Note (Addendum)
Prednisone taper completed and only reduced pain/swelling slightly. R grip strength < L grip strength She has hx of R Ulnar carpal tunnel Referral to Orthopedics placed

## 2017-08-06 DIAGNOSIS — J3089 Other allergic rhinitis: Secondary | ICD-10-CM | POA: Diagnosis not present

## 2017-08-06 DIAGNOSIS — J3081 Allergic rhinitis due to animal (cat) (dog) hair and dander: Secondary | ICD-10-CM | POA: Diagnosis not present

## 2017-08-06 DIAGNOSIS — J301 Allergic rhinitis due to pollen: Secondary | ICD-10-CM | POA: Diagnosis not present

## 2017-08-13 DIAGNOSIS — J3089 Other allergic rhinitis: Secondary | ICD-10-CM | POA: Diagnosis not present

## 2017-08-13 DIAGNOSIS — J3081 Allergic rhinitis due to animal (cat) (dog) hair and dander: Secondary | ICD-10-CM | POA: Diagnosis not present

## 2017-08-13 DIAGNOSIS — J301 Allergic rhinitis due to pollen: Secondary | ICD-10-CM | POA: Diagnosis not present

## 2017-08-14 ENCOUNTER — Ambulatory Visit (INDEPENDENT_AMBULATORY_CARE_PROVIDER_SITE_OTHER): Payer: BLUE CROSS/BLUE SHIELD | Admitting: Orthopaedic Surgery

## 2017-08-14 ENCOUNTER — Encounter (INDEPENDENT_AMBULATORY_CARE_PROVIDER_SITE_OTHER): Payer: Self-pay | Admitting: Orthopaedic Surgery

## 2017-08-14 DIAGNOSIS — M25531 Pain in right wrist: Secondary | ICD-10-CM | POA: Diagnosis not present

## 2017-08-14 MED ORDER — METHYLPREDNISOLONE ACETATE 40 MG/ML IJ SUSP
40.0000 mg | INTRAMUSCULAR | Status: AC | PRN
  Administered 2017-08-14: 40 mg

## 2017-08-14 MED ORDER — LIDOCAINE HCL 1 % IJ SOLN
1.0000 mL | INTRAMUSCULAR | Status: AC | PRN
  Administered 2017-08-14: 1 mL

## 2017-08-14 MED ORDER — MELOXICAM 7.5 MG PO TABS: 7.5000 mg | ORAL_TABLET | Freq: Two times a day (BID) | ORAL | 1 refills | Status: DC | PRN

## 2017-08-14 NOTE — Progress Notes (Signed)
Office Visit Note   Patient: Karen Frazier           Date of Birth: 1980/10/28           MRN: [id number] Visit Date: 08/14/2017              Requested by: Julaine Fusi, NP [address] [place], [place] [address] PCP: Julaine Fusi, NP   Assessment & Plan: Visit Diagnoses:  1. Pain in right wrist     Plan: I do feel that she would benefit from a steroid injection in the 6 dorsal compartment of the right wrist.  I explained the risk and benefits of injections and she tolerated well.  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  We will have her continue meloxicam twice a day.  I would like to see her back in 4 weeks to see how she is doing overall.  If the numbness and tingling persist we may end up ordering nerve conduction studies.  All questions concerns were answered and addressed.  Follow-Up Instructions: Return in about 4 weeks (around 09/11/2017).   Orders:  No orders of the defined types were placed in this encounter.  No orders of the defined types were placed in this encounter.     Procedures: Hand/UE Inj: R extensor compartment 6 for tendonitis on 08/14/2017 9:00 AM Medications: 1 mL lidocaine 1 %; 40 mg methylPREDNISolone acetate 40 MG/ML      Clinical Data: No additional findings.   Subjective: Chief Complaint  Patient presents with  . 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  She said it started about 6 months ago when she was carrying a 50 pound sheet rock and she started developing pain without a specific injury.  It does wake her up at night.  She is tried anti-inflammatories as well as a wrist splint.  X-rays are on the canopy system for me to review.  She does report numbness and tingling in her fingers which is been a separate issue.  She does report a history of cubital  tunnel syndrome in the past which got better with activity modification.  HPI  Review of Systems She currently denies any headache, chest pain, shortness of breath, fever, chills, nausea, vomiting.  Objective: Vital Signs: There were no vitals taken for this visit.  Physical Exam She is alert and oriented x3 and in no acute distress Ortho Exam Examination of her right wrist does show full range of motion.  She has subjective numbness in the median nerve distribution.  She does have ulnar-sided wrist pain.  The ECU tendon does not subluxate.  The pain is more along the course of the sixth dorsal compartment and not over the TFCC.  She is got good strength in her wrist. Specialty Comments:  No specialty comments available.  Imaging: No results found. X-rays independently reviewed of the right wrist show normal bony alignment with no acute findings.  There is no significant arthritic changes.  PMFS History: Patient Active Problem List   Diagnosis Date Noted  . Pain in right wrist 08/14/2017  . Right wrist pain 07/03/2017  . Constipation 07/31/2016  . Thrombophlebitis of superficial veins of right lower extremity 04/30/2016  . 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 Visit for preventive health examination 08/04/2014  .  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 Allergic asthma 08/04/2014   Past Medical History:  Diagnosis Date  . Acute superficial venous thrombosis of right lower extremity   . Asthma   . Environmental allergies   . History of chicken pox   . Seasonal allergies   . UTI (lower urinary tract infection)     Family History  Problem Relation Age of Onset  . Hyperlipidemia Father        Living  . Hypertension Father   . Diabetes Mother        Living  . Hypertension Mother   . Allergy (severe) Mother        Alpha-gal  . Other Mother        IVP Dye  . Heart disease Maternal Grandfather   . COPD Paternal Grandfather   . Healthy Sister        x1  .  Allergies Daughter        x1  . Healthy Daughter        x2  . Diabetes Maternal Aunt   . Hyperlipidemia Maternal Aunt   . Hypertension Maternal Aunt   . Alcohol abuse Maternal Uncle   . Heart attack Maternal Uncle   . Hyperlipidemia Maternal Uncle   . Hypertension Maternal Uncle     Past Surgical History:  Procedure Laterality Date  . NO PAST SURGERIES     Social History   Occupational History  . Occupation: farmer  Tobacco Use  . Smoking status: Never Smoker  . Smokeless tobacco: Never Used  Substance and Sexual Activity  . Alcohol use: Yes    Alcohol/week: 0.6 oz    Types: 1 Standard drinks or equivalent per week  . Drug use: No  . Sexual activity: Yes    Partners: Male    Birth control/protection: Surgical

## 2017-08-27 DIAGNOSIS — J3089 Other allergic rhinitis: Secondary | ICD-10-CM | POA: Diagnosis not present

## 2017-08-27 DIAGNOSIS — J301 Allergic rhinitis due to pollen: Secondary | ICD-10-CM | POA: Diagnosis not present

## 2017-08-27 DIAGNOSIS — J3081 Allergic rhinitis due to animal (cat) (dog) hair and dander: Secondary | ICD-10-CM | POA: Diagnosis not present

## 2017-09-04 DIAGNOSIS — Z01419 Encounter for gynecological examination (general) (routine) without abnormal findings: Secondary | ICD-10-CM | POA: Diagnosis not present

## 2017-09-04 DIAGNOSIS — Z124 Encounter for screening for malignant neoplasm of cervix: Secondary | ICD-10-CM | POA: Diagnosis not present

## 2017-09-04 DIAGNOSIS — Z6832 Body mass index (BMI) 32.0-32.9, adult: Secondary | ICD-10-CM | POA: Diagnosis not present

## 2017-09-11 ENCOUNTER — Ambulatory Visit (INDEPENDENT_AMBULATORY_CARE_PROVIDER_SITE_OTHER): Payer: BLUE CROSS/BLUE SHIELD | Admitting: Orthopaedic Surgery

## 2017-09-12 DIAGNOSIS — J301 Allergic rhinitis due to pollen: Secondary | ICD-10-CM | POA: Diagnosis not present

## 2017-09-12 DIAGNOSIS — J3081 Allergic rhinitis due to animal (cat) (dog) hair and dander: Secondary | ICD-10-CM | POA: Diagnosis not present

## 2017-09-12 DIAGNOSIS — J3089 Other allergic rhinitis: Secondary | ICD-10-CM | POA: Diagnosis not present

## 2017-09-17 DIAGNOSIS — J3081 Allergic rhinitis due to animal (cat) (dog) hair and dander: Secondary | ICD-10-CM | POA: Diagnosis not present

## 2017-09-17 DIAGNOSIS — J3089 Other allergic rhinitis: Secondary | ICD-10-CM | POA: Diagnosis not present

## 2017-09-17 DIAGNOSIS — J301 Allergic rhinitis due to pollen: Secondary | ICD-10-CM | POA: Diagnosis not present

## 2017-09-24 DIAGNOSIS — J3089 Other allergic rhinitis: Secondary | ICD-10-CM | POA: Diagnosis not present

## 2017-09-24 DIAGNOSIS — J301 Allergic rhinitis due to pollen: Secondary | ICD-10-CM | POA: Diagnosis not present

## 2017-09-24 DIAGNOSIS — J3081 Allergic rhinitis due to animal (cat) (dog) hair and dander: Secondary | ICD-10-CM | POA: Diagnosis not present

## 2017-10-01 DIAGNOSIS — J3081 Allergic rhinitis due to animal (cat) (dog) hair and dander: Secondary | ICD-10-CM | POA: Diagnosis not present

## 2017-10-01 DIAGNOSIS — J3089 Other allergic rhinitis: Secondary | ICD-10-CM | POA: Diagnosis not present

## 2017-10-01 DIAGNOSIS — J301 Allergic rhinitis due to pollen: Secondary | ICD-10-CM | POA: Diagnosis not present

## 2017-10-02 DIAGNOSIS — Z6832 Body mass index (BMI) 32.0-32.9, adult: Secondary | ICD-10-CM | POA: Diagnosis not present

## 2017-10-02 DIAGNOSIS — N926 Irregular menstruation, unspecified: Secondary | ICD-10-CM | POA: Diagnosis not present

## 2017-10-15 DIAGNOSIS — J3081 Allergic rhinitis due to animal (cat) (dog) hair and dander: Secondary | ICD-10-CM | POA: Diagnosis not present

## 2017-10-15 DIAGNOSIS — J3089 Other allergic rhinitis: Secondary | ICD-10-CM | POA: Diagnosis not present

## 2017-10-15 DIAGNOSIS — J301 Allergic rhinitis due to pollen: Secondary | ICD-10-CM | POA: Diagnosis not present

## 2017-10-17 DIAGNOSIS — J3081 Allergic rhinitis due to animal (cat) (dog) hair and dander: Secondary | ICD-10-CM | POA: Diagnosis not present

## 2017-10-17 DIAGNOSIS — J301 Allergic rhinitis due to pollen: Secondary | ICD-10-CM | POA: Diagnosis not present

## 2017-10-18 DIAGNOSIS — J3089 Other allergic rhinitis: Secondary | ICD-10-CM | POA: Diagnosis not present

## 2017-10-22 DIAGNOSIS — J3081 Allergic rhinitis due to animal (cat) (dog) hair and dander: Secondary | ICD-10-CM | POA: Diagnosis not present

## 2017-10-22 DIAGNOSIS — J301 Allergic rhinitis due to pollen: Secondary | ICD-10-CM | POA: Diagnosis not present

## 2017-10-22 DIAGNOSIS — J3089 Other allergic rhinitis: Secondary | ICD-10-CM | POA: Diagnosis not present

## 2017-10-31 DIAGNOSIS — J3089 Other allergic rhinitis: Secondary | ICD-10-CM | POA: Diagnosis not present

## 2017-10-31 DIAGNOSIS — J301 Allergic rhinitis due to pollen: Secondary | ICD-10-CM | POA: Diagnosis not present

## 2017-10-31 DIAGNOSIS — J3081 Allergic rhinitis due to animal (cat) (dog) hair and dander: Secondary | ICD-10-CM | POA: Diagnosis not present

## 2017-11-07 DIAGNOSIS — J3081 Allergic rhinitis due to animal (cat) (dog) hair and dander: Secondary | ICD-10-CM | POA: Diagnosis not present

## 2017-11-07 DIAGNOSIS — J3089 Other allergic rhinitis: Secondary | ICD-10-CM | POA: Diagnosis not present

## 2017-11-07 DIAGNOSIS — J301 Allergic rhinitis due to pollen: Secondary | ICD-10-CM | POA: Diagnosis not present

## 2017-11-14 DIAGNOSIS — J3081 Allergic rhinitis due to animal (cat) (dog) hair and dander: Secondary | ICD-10-CM | POA: Diagnosis not present

## 2017-11-14 DIAGNOSIS — J301 Allergic rhinitis due to pollen: Secondary | ICD-10-CM | POA: Diagnosis not present

## 2017-11-14 DIAGNOSIS — J3089 Other allergic rhinitis: Secondary | ICD-10-CM | POA: Diagnosis not present

## 2017-11-26 DIAGNOSIS — J3089 Other allergic rhinitis: Secondary | ICD-10-CM | POA: Diagnosis not present

## 2017-11-26 DIAGNOSIS — J301 Allergic rhinitis due to pollen: Secondary | ICD-10-CM | POA: Diagnosis not present

## 2017-11-26 DIAGNOSIS — J3081 Allergic rhinitis due to animal (cat) (dog) hair and dander: Secondary | ICD-10-CM | POA: Diagnosis not present

## 2017-12-10 DIAGNOSIS — J3081 Allergic rhinitis due to animal (cat) (dog) hair and dander: Secondary | ICD-10-CM | POA: Diagnosis not present

## 2017-12-10 DIAGNOSIS — J301 Allergic rhinitis due to pollen: Secondary | ICD-10-CM | POA: Diagnosis not present

## 2017-12-10 DIAGNOSIS — J3089 Other allergic rhinitis: Secondary | ICD-10-CM | POA: Diagnosis not present

## 2017-12-12 DIAGNOSIS — J3089 Other allergic rhinitis: Secondary | ICD-10-CM | POA: Diagnosis not present

## 2017-12-12 DIAGNOSIS — J3081 Allergic rhinitis due to animal (cat) (dog) hair and dander: Secondary | ICD-10-CM | POA: Diagnosis not present

## 2017-12-12 DIAGNOSIS — J301 Allergic rhinitis due to pollen: Secondary | ICD-10-CM | POA: Diagnosis not present

## 2017-12-17 DIAGNOSIS — J3081 Allergic rhinitis due to animal (cat) (dog) hair and dander: Secondary | ICD-10-CM | POA: Diagnosis not present

## 2017-12-17 DIAGNOSIS — J3089 Other allergic rhinitis: Secondary | ICD-10-CM | POA: Diagnosis not present

## 2017-12-17 DIAGNOSIS — J301 Allergic rhinitis due to pollen: Secondary | ICD-10-CM | POA: Diagnosis not present

## 2017-12-31 DIAGNOSIS — J3089 Other allergic rhinitis: Secondary | ICD-10-CM | POA: Diagnosis not present

## 2017-12-31 DIAGNOSIS — J301 Allergic rhinitis due to pollen: Secondary | ICD-10-CM | POA: Diagnosis not present

## 2017-12-31 DIAGNOSIS — J3081 Allergic rhinitis due to animal (cat) (dog) hair and dander: Secondary | ICD-10-CM | POA: Diagnosis not present

## 2018-01-02 DIAGNOSIS — J301 Allergic rhinitis due to pollen: Secondary | ICD-10-CM | POA: Diagnosis not present

## 2018-01-02 DIAGNOSIS — J3089 Other allergic rhinitis: Secondary | ICD-10-CM | POA: Diagnosis not present

## 2018-01-02 DIAGNOSIS — J3081 Allergic rhinitis due to animal (cat) (dog) hair and dander: Secondary | ICD-10-CM | POA: Diagnosis not present

## 2018-01-07 DIAGNOSIS — J301 Allergic rhinitis due to pollen: Secondary | ICD-10-CM | POA: Diagnosis not present

## 2018-01-07 DIAGNOSIS — J3081 Allergic rhinitis due to animal (cat) (dog) hair and dander: Secondary | ICD-10-CM | POA: Diagnosis not present

## 2018-01-07 DIAGNOSIS — J3089 Other allergic rhinitis: Secondary | ICD-10-CM | POA: Diagnosis not present

## 2018-01-24 DIAGNOSIS — J3089 Other allergic rhinitis: Secondary | ICD-10-CM | POA: Diagnosis not present

## 2018-01-24 DIAGNOSIS — J301 Allergic rhinitis due to pollen: Secondary | ICD-10-CM | POA: Diagnosis not present

## 2018-01-24 DIAGNOSIS — J3081 Allergic rhinitis due to animal (cat) (dog) hair and dander: Secondary | ICD-10-CM | POA: Diagnosis not present

## 2018-01-30 DIAGNOSIS — J3089 Other allergic rhinitis: Secondary | ICD-10-CM | POA: Diagnosis not present

## 2018-01-30 DIAGNOSIS — J301 Allergic rhinitis due to pollen: Secondary | ICD-10-CM | POA: Diagnosis not present

## 2018-01-30 DIAGNOSIS — J3081 Allergic rhinitis due to animal (cat) (dog) hair and dander: Secondary | ICD-10-CM | POA: Diagnosis not present

## 2018-02-06 DIAGNOSIS — J301 Allergic rhinitis due to pollen: Secondary | ICD-10-CM | POA: Diagnosis not present

## 2018-02-06 DIAGNOSIS — J3089 Other allergic rhinitis: Secondary | ICD-10-CM | POA: Diagnosis not present

## 2018-02-06 DIAGNOSIS — J3081 Allergic rhinitis due to animal (cat) (dog) hair and dander: Secondary | ICD-10-CM | POA: Diagnosis not present

## 2018-02-13 DIAGNOSIS — J3089 Other allergic rhinitis: Secondary | ICD-10-CM | POA: Diagnosis not present

## 2018-02-13 DIAGNOSIS — J3081 Allergic rhinitis due to animal (cat) (dog) hair and dander: Secondary | ICD-10-CM | POA: Diagnosis not present

## 2018-02-13 DIAGNOSIS — J301 Allergic rhinitis due to pollen: Secondary | ICD-10-CM | POA: Diagnosis not present

## 2018-02-25 DIAGNOSIS — J3081 Allergic rhinitis due to animal (cat) (dog) hair and dander: Secondary | ICD-10-CM | POA: Diagnosis not present

## 2018-02-25 DIAGNOSIS — J301 Allergic rhinitis due to pollen: Secondary | ICD-10-CM | POA: Diagnosis not present

## 2018-02-25 DIAGNOSIS — J3089 Other allergic rhinitis: Secondary | ICD-10-CM | POA: Diagnosis not present

## 2018-03-04 DIAGNOSIS — J3089 Other allergic rhinitis: Secondary | ICD-10-CM | POA: Diagnosis not present

## 2018-03-04 DIAGNOSIS — J301 Allergic rhinitis due to pollen: Secondary | ICD-10-CM | POA: Diagnosis not present

## 2018-03-04 DIAGNOSIS — J3081 Allergic rhinitis due to animal (cat) (dog) hair and dander: Secondary | ICD-10-CM | POA: Diagnosis not present

## 2018-03-11 DIAGNOSIS — J301 Allergic rhinitis due to pollen: Secondary | ICD-10-CM | POA: Diagnosis not present

## 2018-03-11 DIAGNOSIS — J3089 Other allergic rhinitis: Secondary | ICD-10-CM | POA: Diagnosis not present

## 2018-03-11 DIAGNOSIS — J3081 Allergic rhinitis due to animal (cat) (dog) hair and dander: Secondary | ICD-10-CM | POA: Diagnosis not present

## 2018-04-03 DIAGNOSIS — J3089 Other allergic rhinitis: Secondary | ICD-10-CM | POA: Diagnosis not present

## 2018-04-03 DIAGNOSIS — J3081 Allergic rhinitis due to animal (cat) (dog) hair and dander: Secondary | ICD-10-CM | POA: Diagnosis not present

## 2018-04-03 DIAGNOSIS — J301 Allergic rhinitis due to pollen: Secondary | ICD-10-CM | POA: Diagnosis not present

## 2018-04-07 DIAGNOSIS — L2089 Other atopic dermatitis: Secondary | ICD-10-CM | POA: Diagnosis not present

## 2018-04-07 DIAGNOSIS — L814 Other melanin hyperpigmentation: Secondary | ICD-10-CM | POA: Diagnosis not present

## 2018-04-07 DIAGNOSIS — L218 Other seborrheic dermatitis: Secondary | ICD-10-CM | POA: Diagnosis not present

## 2018-04-10 DIAGNOSIS — J3089 Other allergic rhinitis: Secondary | ICD-10-CM | POA: Diagnosis not present

## 2018-04-10 DIAGNOSIS — J3081 Allergic rhinitis due to animal (cat) (dog) hair and dander: Secondary | ICD-10-CM | POA: Diagnosis not present

## 2018-04-10 DIAGNOSIS — J301 Allergic rhinitis due to pollen: Secondary | ICD-10-CM | POA: Diagnosis not present

## 2018-04-24 DIAGNOSIS — J3089 Other allergic rhinitis: Secondary | ICD-10-CM | POA: Diagnosis not present

## 2018-04-24 DIAGNOSIS — J3081 Allergic rhinitis due to animal (cat) (dog) hair and dander: Secondary | ICD-10-CM | POA: Diagnosis not present

## 2018-04-24 DIAGNOSIS — J301 Allergic rhinitis due to pollen: Secondary | ICD-10-CM | POA: Diagnosis not present

## 2018-05-01 DIAGNOSIS — J3081 Allergic rhinitis due to animal (cat) (dog) hair and dander: Secondary | ICD-10-CM | POA: Diagnosis not present

## 2018-05-01 DIAGNOSIS — J301 Allergic rhinitis due to pollen: Secondary | ICD-10-CM | POA: Diagnosis not present

## 2018-05-01 DIAGNOSIS — J3089 Other allergic rhinitis: Secondary | ICD-10-CM | POA: Diagnosis not present

## 2018-05-08 DIAGNOSIS — J301 Allergic rhinitis due to pollen: Secondary | ICD-10-CM | POA: Diagnosis not present

## 2018-05-08 DIAGNOSIS — J3081 Allergic rhinitis due to animal (cat) (dog) hair and dander: Secondary | ICD-10-CM | POA: Diagnosis not present

## 2018-05-08 DIAGNOSIS — J3089 Other allergic rhinitis: Secondary | ICD-10-CM | POA: Diagnosis not present

## 2018-05-21 DIAGNOSIS — Z3202 Encounter for pregnancy test, result negative: Secondary | ICD-10-CM | POA: Diagnosis not present

## 2018-05-21 DIAGNOSIS — Z6832 Body mass index (BMI) 32.0-32.9, adult: Secondary | ICD-10-CM | POA: Diagnosis not present

## 2018-05-21 DIAGNOSIS — N926 Irregular menstruation, unspecified: Secondary | ICD-10-CM | POA: Diagnosis not present

## 2018-05-22 DIAGNOSIS — J3081 Allergic rhinitis due to animal (cat) (dog) hair and dander: Secondary | ICD-10-CM | POA: Diagnosis not present

## 2018-05-22 DIAGNOSIS — J301 Allergic rhinitis due to pollen: Secondary | ICD-10-CM | POA: Diagnosis not present

## 2018-05-22 DIAGNOSIS — J3089 Other allergic rhinitis: Secondary | ICD-10-CM | POA: Diagnosis not present

## 2018-05-30 DIAGNOSIS — J4520 Mild intermittent asthma, uncomplicated: Secondary | ICD-10-CM | POA: Diagnosis not present

## 2018-05-30 DIAGNOSIS — J3081 Allergic rhinitis due to animal (cat) (dog) hair and dander: Secondary | ICD-10-CM | POA: Diagnosis not present

## 2018-05-30 DIAGNOSIS — J301 Allergic rhinitis due to pollen: Secondary | ICD-10-CM | POA: Diagnosis not present

## 2018-05-30 DIAGNOSIS — J3089 Other allergic rhinitis: Secondary | ICD-10-CM | POA: Diagnosis not present

## 2018-06-05 DIAGNOSIS — J3089 Other allergic rhinitis: Secondary | ICD-10-CM | POA: Diagnosis not present

## 2018-06-05 DIAGNOSIS — J301 Allergic rhinitis due to pollen: Secondary | ICD-10-CM | POA: Diagnosis not present

## 2018-06-05 DIAGNOSIS — J3081 Allergic rhinitis due to animal (cat) (dog) hair and dander: Secondary | ICD-10-CM | POA: Diagnosis not present

## 2018-06-27 DIAGNOSIS — J301 Allergic rhinitis due to pollen: Secondary | ICD-10-CM | POA: Diagnosis not present

## 2018-06-27 DIAGNOSIS — J3089 Other allergic rhinitis: Secondary | ICD-10-CM | POA: Diagnosis not present

## 2018-06-27 DIAGNOSIS — J3081 Allergic rhinitis due to animal (cat) (dog) hair and dander: Secondary | ICD-10-CM | POA: Diagnosis not present

## 2018-07-03 DIAGNOSIS — J301 Allergic rhinitis due to pollen: Secondary | ICD-10-CM | POA: Diagnosis not present

## 2018-07-03 DIAGNOSIS — J3081 Allergic rhinitis due to animal (cat) (dog) hair and dander: Secondary | ICD-10-CM | POA: Diagnosis not present

## 2018-07-03 DIAGNOSIS — J3089 Other allergic rhinitis: Secondary | ICD-10-CM | POA: Diagnosis not present

## 2018-07-04 DIAGNOSIS — J3089 Other allergic rhinitis: Secondary | ICD-10-CM | POA: Diagnosis not present

## 2018-07-10 DIAGNOSIS — J3089 Other allergic rhinitis: Secondary | ICD-10-CM | POA: Diagnosis not present

## 2018-07-10 DIAGNOSIS — J301 Allergic rhinitis due to pollen: Secondary | ICD-10-CM | POA: Diagnosis not present

## 2018-07-10 DIAGNOSIS — J3081 Allergic rhinitis due to animal (cat) (dog) hair and dander: Secondary | ICD-10-CM | POA: Diagnosis not present

## 2018-07-17 DIAGNOSIS — J3081 Allergic rhinitis due to animal (cat) (dog) hair and dander: Secondary | ICD-10-CM | POA: Diagnosis not present

## 2018-07-17 DIAGNOSIS — J301 Allergic rhinitis due to pollen: Secondary | ICD-10-CM | POA: Diagnosis not present

## 2018-07-17 DIAGNOSIS — J3089 Other allergic rhinitis: Secondary | ICD-10-CM | POA: Diagnosis not present

## 2018-07-24 DIAGNOSIS — J301 Allergic rhinitis due to pollen: Secondary | ICD-10-CM | POA: Diagnosis not present

## 2018-07-24 DIAGNOSIS — J3081 Allergic rhinitis due to animal (cat) (dog) hair and dander: Secondary | ICD-10-CM | POA: Diagnosis not present

## 2018-07-24 DIAGNOSIS — J3089 Other allergic rhinitis: Secondary | ICD-10-CM | POA: Diagnosis not present

## 2018-07-29 DIAGNOSIS — J3081 Allergic rhinitis due to animal (cat) (dog) hair and dander: Secondary | ICD-10-CM | POA: Diagnosis not present

## 2018-07-29 DIAGNOSIS — J301 Allergic rhinitis due to pollen: Secondary | ICD-10-CM | POA: Diagnosis not present

## 2018-07-29 DIAGNOSIS — J3089 Other allergic rhinitis: Secondary | ICD-10-CM | POA: Diagnosis not present

## 2018-08-05 DIAGNOSIS — J3081 Allergic rhinitis due to animal (cat) (dog) hair and dander: Secondary | ICD-10-CM | POA: Diagnosis not present

## 2018-08-05 DIAGNOSIS — J301 Allergic rhinitis due to pollen: Secondary | ICD-10-CM | POA: Diagnosis not present

## 2018-08-05 DIAGNOSIS — J3089 Other allergic rhinitis: Secondary | ICD-10-CM | POA: Diagnosis not present

## 2018-08-12 DIAGNOSIS — J3089 Other allergic rhinitis: Secondary | ICD-10-CM | POA: Diagnosis not present

## 2018-08-12 DIAGNOSIS — J3081 Allergic rhinitis due to animal (cat) (dog) hair and dander: Secondary | ICD-10-CM | POA: Diagnosis not present

## 2018-08-12 DIAGNOSIS — J301 Allergic rhinitis due to pollen: Secondary | ICD-10-CM | POA: Diagnosis not present

## 2018-08-19 DIAGNOSIS — J3081 Allergic rhinitis due to animal (cat) (dog) hair and dander: Secondary | ICD-10-CM | POA: Diagnosis not present

## 2018-08-19 DIAGNOSIS — J3089 Other allergic rhinitis: Secondary | ICD-10-CM | POA: Diagnosis not present

## 2018-08-19 DIAGNOSIS — J301 Allergic rhinitis due to pollen: Secondary | ICD-10-CM | POA: Diagnosis not present

## 2018-08-26 DIAGNOSIS — J3081 Allergic rhinitis due to animal (cat) (dog) hair and dander: Secondary | ICD-10-CM | POA: Diagnosis not present

## 2018-08-26 DIAGNOSIS — J301 Allergic rhinitis due to pollen: Secondary | ICD-10-CM | POA: Diagnosis not present

## 2018-08-26 DIAGNOSIS — J3089 Other allergic rhinitis: Secondary | ICD-10-CM | POA: Diagnosis not present

## 2018-09-09 DIAGNOSIS — J3089 Other allergic rhinitis: Secondary | ICD-10-CM | POA: Diagnosis not present

## 2018-09-09 DIAGNOSIS — J301 Allergic rhinitis due to pollen: Secondary | ICD-10-CM | POA: Diagnosis not present

## 2018-09-09 DIAGNOSIS — J3081 Allergic rhinitis due to animal (cat) (dog) hair and dander: Secondary | ICD-10-CM | POA: Diagnosis not present

## 2018-09-16 DIAGNOSIS — J301 Allergic rhinitis due to pollen: Secondary | ICD-10-CM | POA: Diagnosis not present

## 2018-09-16 DIAGNOSIS — J3089 Other allergic rhinitis: Secondary | ICD-10-CM | POA: Diagnosis not present

## 2018-09-16 DIAGNOSIS — J3081 Allergic rhinitis due to animal (cat) (dog) hair and dander: Secondary | ICD-10-CM | POA: Diagnosis not present

## 2018-09-23 DIAGNOSIS — J3089 Other allergic rhinitis: Secondary | ICD-10-CM | POA: Diagnosis not present

## 2018-09-23 DIAGNOSIS — J301 Allergic rhinitis due to pollen: Secondary | ICD-10-CM | POA: Diagnosis not present

## 2018-09-23 DIAGNOSIS — J3081 Allergic rhinitis due to animal (cat) (dog) hair and dander: Secondary | ICD-10-CM | POA: Diagnosis not present

## 2018-09-25 ENCOUNTER — Encounter: Payer: Self-pay | Admitting: Adult Health

## 2018-09-30 DIAGNOSIS — J3081 Allergic rhinitis due to animal (cat) (dog) hair and dander: Secondary | ICD-10-CM | POA: Diagnosis not present

## 2018-09-30 DIAGNOSIS — J3089 Other allergic rhinitis: Secondary | ICD-10-CM | POA: Diagnosis not present

## 2018-09-30 DIAGNOSIS — J301 Allergic rhinitis due to pollen: Secondary | ICD-10-CM | POA: Diagnosis not present

## 2018-10-07 ENCOUNTER — Other Ambulatory Visit: Payer: Self-pay

## 2018-10-07 DIAGNOSIS — J3081 Allergic rhinitis due to animal (cat) (dog) hair and dander: Secondary | ICD-10-CM | POA: Diagnosis not present

## 2018-10-07 DIAGNOSIS — J3089 Other allergic rhinitis: Secondary | ICD-10-CM | POA: Diagnosis not present

## 2018-10-07 DIAGNOSIS — J301 Allergic rhinitis due to pollen: Secondary | ICD-10-CM | POA: Diagnosis not present

## 2018-10-14 ENCOUNTER — Encounter: Payer: Self-pay | Admitting: Adult Health

## 2018-10-14 DIAGNOSIS — J3081 Allergic rhinitis due to animal (cat) (dog) hair and dander: Secondary | ICD-10-CM | POA: Diagnosis not present

## 2018-10-14 DIAGNOSIS — J3089 Other allergic rhinitis: Secondary | ICD-10-CM | POA: Diagnosis not present

## 2018-10-14 DIAGNOSIS — J301 Allergic rhinitis due to pollen: Secondary | ICD-10-CM | POA: Diagnosis not present

## 2018-10-23 DIAGNOSIS — J301 Allergic rhinitis due to pollen: Secondary | ICD-10-CM | POA: Diagnosis not present

## 2018-10-23 DIAGNOSIS — J3089 Other allergic rhinitis: Secondary | ICD-10-CM | POA: Diagnosis not present

## 2018-10-23 DIAGNOSIS — J3081 Allergic rhinitis due to animal (cat) (dog) hair and dander: Secondary | ICD-10-CM | POA: Diagnosis not present

## 2018-10-28 DIAGNOSIS — J301 Allergic rhinitis due to pollen: Secondary | ICD-10-CM | POA: Diagnosis not present

## 2018-10-28 DIAGNOSIS — J3081 Allergic rhinitis due to animal (cat) (dog) hair and dander: Secondary | ICD-10-CM | POA: Diagnosis not present

## 2018-10-28 DIAGNOSIS — J3089 Other allergic rhinitis: Secondary | ICD-10-CM | POA: Diagnosis not present

## 2018-11-04 DIAGNOSIS — J301 Allergic rhinitis due to pollen: Secondary | ICD-10-CM | POA: Diagnosis not present

## 2018-11-04 DIAGNOSIS — J3089 Other allergic rhinitis: Secondary | ICD-10-CM | POA: Diagnosis not present

## 2018-11-04 DIAGNOSIS — J3081 Allergic rhinitis due to animal (cat) (dog) hair and dander: Secondary | ICD-10-CM | POA: Diagnosis not present

## 2018-11-11 DIAGNOSIS — J3081 Allergic rhinitis due to animal (cat) (dog) hair and dander: Secondary | ICD-10-CM | POA: Diagnosis not present

## 2018-11-11 DIAGNOSIS — J3089 Other allergic rhinitis: Secondary | ICD-10-CM | POA: Diagnosis not present

## 2018-11-11 DIAGNOSIS — J301 Allergic rhinitis due to pollen: Secondary | ICD-10-CM | POA: Diagnosis not present

## 2018-11-18 DIAGNOSIS — J3081 Allergic rhinitis due to animal (cat) (dog) hair and dander: Secondary | ICD-10-CM | POA: Diagnosis not present

## 2018-11-18 DIAGNOSIS — J3089 Other allergic rhinitis: Secondary | ICD-10-CM | POA: Diagnosis not present

## 2018-11-18 DIAGNOSIS — J301 Allergic rhinitis due to pollen: Secondary | ICD-10-CM | POA: Diagnosis not present

## 2018-11-25 DIAGNOSIS — J301 Allergic rhinitis due to pollen: Secondary | ICD-10-CM | POA: Diagnosis not present

## 2018-11-25 DIAGNOSIS — J3089 Other allergic rhinitis: Secondary | ICD-10-CM | POA: Diagnosis not present

## 2018-11-25 DIAGNOSIS — J3081 Allergic rhinitis due to animal (cat) (dog) hair and dander: Secondary | ICD-10-CM | POA: Diagnosis not present

## 2018-11-28 DIAGNOSIS — J3081 Allergic rhinitis due to animal (cat) (dog) hair and dander: Secondary | ICD-10-CM | POA: Diagnosis not present

## 2018-11-28 DIAGNOSIS — J301 Allergic rhinitis due to pollen: Secondary | ICD-10-CM | POA: Diagnosis not present

## 2018-12-01 DIAGNOSIS — J3089 Other allergic rhinitis: Secondary | ICD-10-CM | POA: Diagnosis not present

## 2018-12-02 DIAGNOSIS — J3081 Allergic rhinitis due to animal (cat) (dog) hair and dander: Secondary | ICD-10-CM | POA: Diagnosis not present

## 2018-12-02 DIAGNOSIS — J301 Allergic rhinitis due to pollen: Secondary | ICD-10-CM | POA: Diagnosis not present

## 2018-12-02 DIAGNOSIS — J3089 Other allergic rhinitis: Secondary | ICD-10-CM | POA: Diagnosis not present

## 2018-12-09 DIAGNOSIS — J3081 Allergic rhinitis due to animal (cat) (dog) hair and dander: Secondary | ICD-10-CM | POA: Diagnosis not present

## 2018-12-09 DIAGNOSIS — J301 Allergic rhinitis due to pollen: Secondary | ICD-10-CM | POA: Diagnosis not present

## 2018-12-09 DIAGNOSIS — J3089 Other allergic rhinitis: Secondary | ICD-10-CM | POA: Diagnosis not present

## 2018-12-16 DIAGNOSIS — J3081 Allergic rhinitis due to animal (cat) (dog) hair and dander: Secondary | ICD-10-CM | POA: Diagnosis not present

## 2018-12-16 DIAGNOSIS — J301 Allergic rhinitis due to pollen: Secondary | ICD-10-CM | POA: Diagnosis not present

## 2018-12-16 DIAGNOSIS — J3089 Other allergic rhinitis: Secondary | ICD-10-CM | POA: Diagnosis not present

## 2018-12-17 ENCOUNTER — Other Ambulatory Visit: Payer: Self-pay

## 2018-12-17 ENCOUNTER — Other Ambulatory Visit (INDEPENDENT_AMBULATORY_CARE_PROVIDER_SITE_OTHER): Payer: BC Managed Care – PPO

## 2018-12-17 DIAGNOSIS — Z0000 Encounter for general adult medical examination without abnormal findings: Secondary | ICD-10-CM

## 2018-12-17 DIAGNOSIS — Z833 Family history of diabetes mellitus: Secondary | ICD-10-CM | POA: Diagnosis not present

## 2018-12-17 DIAGNOSIS — E785 Hyperlipidemia, unspecified: Secondary | ICD-10-CM | POA: Diagnosis not present

## 2018-12-18 LAB — HEMOGLOBIN A1C
Est. average glucose Bld gHb Est-mCnc: 111 mg/dL
Hgb A1c MFr Bld: 5.5 % (ref 4.8–5.6)

## 2018-12-18 LAB — CBC WITH DIFFERENTIAL/PLATELET
Basophils Absolute: 0.1 10*3/uL (ref 0.0–0.2)
Basos: 1 %
EOS (ABSOLUTE): 0.1 10*3/uL (ref 0.0–0.4)
Eos: 2 %
Hematocrit: 36 % (ref 34.0–46.6)
Hemoglobin: 12.4 g/dL (ref 11.1–15.9)
Immature Grans (Abs): 0 10*3/uL (ref 0.0–0.1)
Immature Granulocytes: 0 %
Lymphocytes Absolute: 2.6 10*3/uL (ref 0.7–3.1)
Lymphs: 42 %
MCH: 31.6 pg (ref 26.6–33.0)
MCHC: 34.4 g/dL (ref 31.5–35.7)
MCV: 92 fL (ref 79–97)
Monocytes Absolute: 0.6 10*3/uL (ref 0.1–0.9)
Monocytes: 9 %
Neutrophils Absolute: 2.9 10*3/uL (ref 1.4–7.0)
Neutrophils: 46 %
Platelets: 363 10*3/uL (ref 150–450)
RBC: 3.92 x10E6/uL (ref 3.77–5.28)
RDW: 12.3 % (ref 11.7–15.4)
WBC: 6.3 10*3/uL (ref 3.4–10.8)

## 2018-12-18 LAB — COMPREHENSIVE METABOLIC PANEL
ALT: 10 IU/L (ref 0–32)
AST: 12 IU/L (ref 0–40)
Albumin/Globulin Ratio: 1.6 (ref 1.2–2.2)
Albumin: 4.2 g/dL (ref 3.8–4.8)
Alkaline Phosphatase: 47 IU/L (ref 39–117)
BUN/Creatinine Ratio: 13 (ref 9–23)
BUN: 9 mg/dL (ref 6–20)
Bilirubin Total: 0.3 mg/dL (ref 0.0–1.2)
CO2: 18 mmol/L — ABNORMAL LOW (ref 20–29)
Calcium: 9 mg/dL (ref 8.7–10.2)
Chloride: 106 mmol/L (ref 96–106)
Creatinine, Ser: 0.71 mg/dL (ref 0.57–1.00)
GFR calc Af Amer: 125 mL/min/{1.73_m2} (ref >59)
GFR calc non Af Amer: 108 mL/min/{1.73_m2} (ref >59)
Globulin, Total: 2.6 g/dL (ref 1.5–4.5)
Glucose: 96 mg/dL (ref 65–99)
Potassium: 4 mmol/L (ref 3.5–5.2)
Sodium: 141 mmol/L (ref 134–144)
Total Protein: 6.8 g/dL (ref 6.0–8.5)

## 2018-12-18 LAB — LIPID PANEL
Chol/HDL Ratio: 3.8 ratio (ref 0.0–4.4)
Cholesterol, Total: 254 mg/dL — ABNORMAL HIGH (ref 100–199)
HDL: 67 mg/dL (ref >39)
LDL Calculated: 156 mg/dL — ABNORMAL HIGH (ref 0–99)
Triglycerides: 155 mg/dL — ABNORMAL HIGH (ref 0–149)
VLDL Cholesterol Cal: 31 mg/dL (ref 5–40)

## 2018-12-18 LAB — TSH: TSH: 3 u[IU]/mL (ref 0.450–4.500)

## 2018-12-23 DIAGNOSIS — J3089 Other allergic rhinitis: Secondary | ICD-10-CM | POA: Diagnosis not present

## 2018-12-23 DIAGNOSIS — J3081 Allergic rhinitis due to animal (cat) (dog) hair and dander: Secondary | ICD-10-CM | POA: Diagnosis not present

## 2018-12-23 DIAGNOSIS — J301 Allergic rhinitis due to pollen: Secondary | ICD-10-CM | POA: Diagnosis not present

## 2018-12-24 NOTE — Progress Notes (Signed)
Subjective:    Patient ID: Karen Frazier, female    DOB: 03/24/1981, 38 y.o.   MRN: [id number]  HPI:  Karen Frazier is here for CPE She continues to experience constipation despite drinking >gallon water/day, diet rich in fruits/vegetables, vigorous daily exercise (2 hrs /day- weight lifting and cardio). She stopped using the Linzess 145mcg QD- wasn't producing daily BMs She denies hematochezia She reports frequent abdominal bloating- she is not using OTC Probiotic   12/17/2018 Labs: TSH-WNL, 3.000  Lipid Panel-  Tot 254  TGs-155  HDL- 67  LDL-156, jump from 118 last year  Encouraged reduction of saturated fat and increase regular exercise  A1c-5.5  CMP-WNL  CBC-WNL   Healthcare Maintnenace: PAP-UTD, 05/17/16- normal Mammogram-N/A Immunizations-UTD  Patient Care Team    Relationship Specialty Notifications Start End  Julaine Fusianford,  D, NP PCP - General Family Medicine  07/31/16   [place], [facility]    07/03/17     Patient Active Problem List   Diagnosis Date Noted  . Healthcare maintenance 12/25/2018  . Chronic idiopathic constipation 12/25/2018  . Pain in right wrist 08/14/2017  . Right wrist pain 07/03/2017  . Constipation 07/31/2016  . Thrombophlebitis of superficial veins of right lower extremity 04/30/2016  . 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 Visit for preventive health examination 08/04/2014  . 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 Allergic asthma 08/04/2014     Past Medical History:  Diagnosis Date  . Acute superficial venous thrombosis of right lower extremity   . Asthma   . Environmental allergies   . History of chicken pox   . Seasonal allergies   . UTI (lower urinary tract infection)      Past Surgical History:  Procedure Laterality Date  . NO PAST SURGERIES       Family History  Problem Relation Age of Onset  . Hyperlipidemia Father        Living  . Hypertension Father   . Diabetes Mother        Living  .  Hypertension Mother   . Allergy (severe) Mother        Alpha-gal  . Other Mother        IVP Dye  . Heart disease Maternal Grandfather   . COPD Paternal Grandfather   . Healthy Sister        x1  . Allergies Daughter        x1  . Healthy Daughter        x2  . Diabetes Maternal Aunt   . Hyperlipidemia Maternal Aunt   . Hypertension Maternal Aunt   . Alcohol abuse Maternal Uncle   . Heart attack Maternal Uncle   . Hyperlipidemia Maternal Uncle   . Hypertension Maternal Uncle      Social History   Substance and Sexual Activity  Drug Use No     Social History   Substance and Sexual Activity  Alcohol Use Yes  . Alcohol/week: 1.0 standard drinks  . Types: 1 Standard drinks or equivalent per week     Social History   Tobacco Use  Smoking Status Never Smoker  Smokeless Tobacco Never Used     Outpatient Encounter Medications as of 12/25/2018  Medication Sig  . albuterol (PROVENTIL HFA;VENTOLIN HFA) 108 (90 BASE) MCG/ACT inhaler Inhale 1-2 puffs into the lungs every 6 (six) hours as needed for wheezing or shortness of breath.  . CRYSELLE-28 0.3-30 MG-MCG tablet Take 1 tablet by mouth daily.  Marland Kitchen. EPINEPHrine 0.3 mg/0.3  mL IJ SOAJ injection Inject into the muscle once. Reported on 07/15/2015  . montelukast (SINGULAIR) 10 MG tablet Take 10 mg by mouth daily as needed.  . triamcinolone ointment (KENALOG) 0.1 % Apply 1 application topically as needed.  . Vitamin D, Ergocalciferol, (DRISDOL) 50000 units CAPS capsule Take 1 capsule (50,000 Units total) by mouth every 7 (seven) days.  . [DISCONTINUED] linaclotide (LINZESS) 145 MCG CAPS capsule Take 1 capsule (145 mcg total) by mouth daily before breakfast.  . [DISCONTINUED] meloxicam (MOBIC) 7.5 MG tablet Take 1 tablet (7.5 mg total) by mouth 2 (two) times daily between meals as needed for pain.   No facility-administered encounter medications on file as of 12/25/2018.     Allergies: Gelatin and Other  Body mass index is 31.65  kg/m.  Blood pressure 123/86, pulse 81, temperature 98.7 F (37.1 C), temperature source Oral, height 5\' 4"  (1.626 m), weight 184 lb 6.4 oz (83.6 kg), last menstrual period 12/04/2018, SpO2 100 %.     Review of Systems  Constitutional: Positive for fatigue. Negative for activity change, appetite change, chills, diaphoresis, fever and unexpected weight change.  HENT: Negative for congestion.   Eyes: Negative for visual disturbance.  Respiratory: Negative for cough, chest tightness, shortness of breath and wheezing.   Cardiovascular: Negative for chest pain, palpitations and leg swelling.  Gastrointestinal: Positive for abdominal distention and constipation. Negative for abdominal pain, blood in stool, diarrhea, nausea and vomiting.  Endocrine: Negative for cold intolerance, heat intolerance, polydipsia, polyphagia and polyuria.  Genitourinary: Negative for difficulty urinating and flank pain.  Musculoskeletal: Negative for arthralgias, back pain, gait problem, joint swelling, myalgias, neck pain and neck stiffness.  Skin: Negative for color change, pallor, rash and wound.  Neurological: Negative for dizziness and facial asymmetry.  Hematological: Negative for adenopathy. Does not bruise/bleed easily.  Psychiatric/Behavioral: Negative for agitation, behavioral problems, confusion, decreased concentration, dysphoric mood, hallucinations, self-injury, sleep disturbance and suicidal ideas. The patient is not nervous/anxious and is not hyperactive.        Objective:   Physical Exam Vitals signs and nursing note reviewed.  Constitutional:      General: She is not in acute distress.    Appearance: Normal appearance. She is not ill-appearing, toxic-appearing or diaphoretic.  HENT:     Head: Normocephalic and atraumatic.     Right Ear: Tympanic membrane, ear canal and external ear normal. There is no impacted cerumen.     Left Ear: Tympanic membrane, ear canal and external ear normal. There  is no impacted cerumen.     Nose: Nose normal. No congestion.     Mouth/Throat:     Mouth: Mucous membranes are moist.     Pharynx: No oropharyngeal exudate or posterior oropharyngeal erythema.  Eyes:     Extraocular Movements: Extraocular movements intact.     Conjunctiva/sclera: Conjunctivae normal.     Pupils: Pupils are equal, round, and reactive to light.  Neck:     Musculoskeletal: Normal range of motion. No muscular tenderness.  Cardiovascular:     Rate and Rhythm: Normal rate.     Pulses: Normal pulses.     Heart sounds: Normal heart sounds. No murmur. No friction rub. No gallop.   Pulmonary:     Effort: Pulmonary effort is normal. No respiratory distress.     Breath sounds: Normal breath sounds. No stridor. No wheezing, rhonchi or rales.  Chest:     Chest wall: No tenderness.  Abdominal:     General: Abdomen is protuberant. Bowel  sounds are normal.     Tenderness: There is no abdominal tenderness. There is no right CVA tenderness, left CVA tenderness, guarding or rebound. Negative signs include Murphy's sign, Rovsing's sign, McBurney's sign and psoas sign.  Musculoskeletal: Normal range of motion.        General: No swelling or tenderness.  Lymphadenopathy:     Cervical: No cervical adenopathy.  Skin:    General: Skin is warm and dry.     Capillary Refill: Capillary refill takes less than 2 seconds.  Neurological:     Mental Status: She is alert and oriented to person, place, and time.     Coordination: Coordination normal.  Psychiatric:        Mood and Affect: Mood normal.        Behavior: Behavior normal.        Thought Content: Thought content normal.        Judgment: Judgment normal.       Assessment & Plan:   1. Healthcare maintenance   2. Chronic idiopathic constipation     Healthcare maintenance Continue your excellent water intake, regular exercise, recommend reducing saturated fat intake (ie. Cheese, red meat, whole milk). Recommend incorporating  Probiotic-,ie. Gundry MD If you would like a referral to Gastroenterologist, please call clinic. Continue with OB/GYN as directed. Recommend annual physical with fasting labs. Continue to social distance and wear a mask when in public.  Chronic idiopathic constipation She continues to experience constipation despite drinking >gallon water/day, diet rich in fruits/vegetables, vigorous daily exercise (2 hrs /day- weight lifting and cardio). She stopped using the Linzess 145mcg QD- wasn't producing daily BMs She denies hematochezia She reports frequent abdominal bloating- she is not using OTC Probiotic     FOLLOW-UP:  Return in about 1 year (around 12/25/2019) for CPE, Fasting Labs.

## 2018-12-25 ENCOUNTER — Encounter: Payer: Self-pay | Admitting: Adult Health

## 2018-12-25 ENCOUNTER — Ambulatory Visit (INDEPENDENT_AMBULATORY_CARE_PROVIDER_SITE_OTHER): Payer: BC Managed Care – PPO | Admitting: Adult Health

## 2018-12-25 ENCOUNTER — Other Ambulatory Visit: Payer: Self-pay

## 2018-12-25 DIAGNOSIS — Z0000 Encounter for general adult medical examination without abnormal findings: Secondary | ICD-10-CM | POA: Diagnosis not present

## 2018-12-25 DIAGNOSIS — K5904 Chronic idiopathic constipation: Secondary | ICD-10-CM

## 2018-12-25 NOTE — Patient Instructions (Addendum)
Preventive Care for Adults, Female  A healthy lifestyle and preventive care can promote health and wellness. Preventive health guidelines for women include the following key practices.   A routine yearly physical is a good way to check with your health care provider about your health and preventive screening. It is a chance to share any concerns and updates on your health and to receive a thorough exam.   Visit your dentist for a routine exam and preventive care every 6 months. Brush your teeth twice a day and floss once a day. Good oral hygiene prevents tooth decay and gum disease.   The frequency of eye exams is based on your age, health, family medical history, use of contact lenses, and other factors. Follow your health care provider's recommendations for frequency of eye exams.   Eat a healthy diet. Foods like vegetables, fruits, whole grains, low-fat dairy products, and lean protein foods contain the nutrients you need without too many calories. Decrease your intake of foods high in solid fats, added sugars, and salt. Eat the right amount of calories for you.Get information about a proper diet from your health care provider, if necessary.   Regular physical exercise is one of the most important things you can do for your health. Most adults should get at least 150 minutes of moderate-intensity exercise (any activity that increases your heart rate and causes you to sweat) each week. In addition, most adults need muscle-strengthening exercises on 2 or more days a week.   Maintain a healthy weight. The body mass index (BMI) is a screening tool to identify possible weight problems. It provides an estimate of body fat based on height and weight. Your health care provider can find your BMI, and can help you achieve or maintain a healthy weight.For adults 20 years and older:   - A BMI below 18.5 is considered underweight.   - A BMI of 18.5 to 24.9 is normal.   - A BMI of 25 to 29.9 is  considered overweight.   - A BMI of 30 and above is considered obese.   Maintain normal blood lipids and cholesterol levels by exercising and minimizing your intake of trans and saturated fats.  Eat a balanced diet with plenty of fruit and vegetables. Blood tests for lipids and cholesterol should begin at age 20 and be repeated every 5 years minimum.  If your lipid or cholesterol levels are high, you are over 40, or you are at high risk for heart disease, you may need your cholesterol levels checked more frequently.Ongoing high lipid and cholesterol levels should be treated with medicines if diet and exercise are not working.   If you smoke, find out from your health care provider how to quit. If you do not use tobacco, do not start.   Lung cancer screening is recommended for adults aged 55-80 years who are at high risk for developing lung cancer because of a history of smoking. A yearly low-dose CT scan of the lungs is recommended for people who have at least a 30-pack-year history of smoking and are a current smoker or have quit within the past 15 years. A pack year of smoking is smoking an average of 1 pack of cigarettes a day for 1 year (for example: 1 pack a day for 30 years or 2 packs a day for 15 years). Yearly screening should continue until the smoker has stopped smoking for at least 15 years. Yearly screening should be stopped for people who develop a   health problem that would prevent them from having lung cancer treatment.   If you are pregnant, do not drink alcohol. If you are breastfeeding, be very cautious about drinking alcohol. If you are not pregnant and choose to drink alcohol, do not have more than 1 drink per day. One drink is considered to be 12 ounces (355 mL) of beer, 5 ounces (148 mL) of wine, or 1.5 ounces (44 mL) of liquor.   Avoid use of street drugs. Do not share needles with anyone. Ask for help if you need support or instructions about stopping the use of  drugs.   High blood pressure causes heart disease and increases the risk of stroke. Your blood pressure should be checked at least yearly.  Ongoing high blood pressure should be treated with medicines if weight loss and exercise do not work.   If you are 69-55 years old, ask your health care provider if you should take aspirin to prevent strokes.   Diabetes screening involves taking a blood sample to check your fasting blood sugar level. This should be done once every 3 years, after age 38, if you are within normal weight and without risk factors for diabetes. Testing should be considered at a younger age or be carried out more frequently if you are overweight and have at least 1 risk factor for diabetes.   Breast cancer screening is essential preventive care for women. You should practice "breast self-awareness."  This means understanding the normal appearance and feel of your breasts and may include breast self-examination.  Any changes detected, no matter how small, should be reported to a health care provider.  Women in their 80s and 30s should have a clinical breast exam (CBE) by a health care provider as part of a regular health exam every 1 to 3 years.  After age 66, women should have a CBE every year.  Starting at age 1, women should consider having a mammogram (breast X-ray test) every year.  Women who have a family history of breast cancer should talk to their health care provider about genetic screening.  Women at a high risk of breast cancer should talk to their health care providers about having an MRI and a mammogram every year.   -Breast cancer gene (BRCA)-related cancer risk assessment is recommended for women who have family members with BRCA-related cancers. BRCA-related cancers include breast, ovarian, tubal, and peritoneal cancers. Having family members with these cancers may be associated with an increased risk for harmful changes (mutations) in the breast cancer genes BRCA1 and  BRCA2. Results of the assessment will determine the need for genetic counseling and BRCA1 and BRCA2 testing.   The Pap test is a screening test for cervical cancer. A Pap test can show cell changes on the cervix that might become cervical cancer if left untreated. A Pap test is a procedure in which cells are obtained and examined from the lower end of the uterus (cervix).   - Women should have a Pap test starting at age 57.   - Between ages 90 and 70, Pap tests should be repeated every 2 years.   - Beginning at age 63, you should have a Pap test every 3 years as long as the past 3 Pap tests have been normal.   - Some women have medical problems that increase the chance of getting cervical cancer. Talk to your health care provider about these problems. It is especially important to talk to your health care provider if a  new problem develops soon after your last Pap test. In these cases, your health care provider may recommend more frequent screening and Pap tests.   - The above recommendations are the same for women who have or have not gotten the vaccine for human papillomavirus (HPV).   - If you had a hysterectomy for a problem that was not cancer or a condition that could lead to cancer, then you no longer need Pap tests. Even if you no longer need a Pap test, a regular exam is a good idea to make sure no other problems are starting.   - If you are between ages 36 and 66 years, and you have had normal Pap tests going back 10 years, you no longer need Pap tests. Even if you no longer need a Pap test, a regular exam is a good idea to make sure no other problems are starting.   - If you have had past treatment for cervical cancer or a condition that could lead to cancer, you need Pap tests and screening for cancer for at least 20 years after your treatment.   - If Pap tests have been discontinued, risk factors (such as a new sexual partner) need to be reassessed to determine if screening should  be resumed.   - The HPV test is an additional test that may be used for cervical cancer screening. The HPV test looks for the virus that can cause the cell changes on the cervix. The cells collected during the Pap test can be tested for HPV. The HPV test could be used to screen women aged 70 years and older, and should be used in women of any age who have unclear Pap test results. After the age of 67, women should have HPV testing at the same frequency as a Pap test.   Colorectal cancer can be detected and often prevented. Most routine colorectal cancer screening begins at the age of 57 years and continues through age 26 years. However, your health care provider may recommend screening at an earlier age if you have risk factors for colon cancer. On a yearly basis, your health care provider may provide home test kits to check for hidden blood in the stool.  Use of a small camera at the end of a tube, to directly examine the colon (sigmoidoscopy or colonoscopy), can detect the earliest forms of colorectal cancer. Talk to your health care provider about this at age 23, when routine screening begins. Direct exam of the colon should be repeated every 5 -10 years through age 49 years, unless early forms of pre-cancerous polyps or small growths are found.   People who are at an increased risk for hepatitis B should be screened for this virus. You are considered at high risk for hepatitis B if:  -You were born in a country where hepatitis B occurs often. Talk with your health care provider about which countries are considered high risk.  - Your parents were born in a high-risk country and you have not received a shot to protect against hepatitis B (hepatitis B vaccine).  - You have HIV or AIDS.  - You use needles to inject street drugs.  - You live with, or have sex with, someone who has Hepatitis B.  - You get hemodialysis treatment.  - You take certain medicines for conditions like cancer, organ  transplantation, and autoimmune conditions.   Hepatitis C blood testing is recommended for all people born from [birth year] through 1965 and any individual  with known risks for hepatitis C.   Practice safe sex. Use condoms and avoid high-risk sexual practices to reduce the spread of sexually transmitted infections (STIs). STIs include gonorrhea, chlamydia, syphilis, trichomonas, herpes, HPV, and human immunodeficiency virus (HIV). Herpes, HIV, and HPV are viral illnesses that have no cure. They can result in disability, cancer, and death. Sexually active women aged 25 years and younger should be checked for chlamydia. Older women with new or multiple partners should also be tested for chlamydia. Testing for other STIs is recommended if you are sexually active and at increased risk.   Osteoporosis is a disease in which the bones lose minerals and strength with aging. This can result in serious bone fractures or breaks. The risk of osteoporosis can be identified using a bone density scan. Women ages 65 years and over and women at risk for fractures or osteoporosis should discuss screening with their health care providers. Ask your health care provider whether you should take a calcium supplement or vitamin D to There are also several preventive steps women can take to avoid osteoporosis and resulting fractures or to keep osteoporosis from worsening. -->Recommendations include:  Eat a balanced diet high in fruits, vegetables, calcium, and vitamins.  Get enough calcium. The recommended total intake of is 1,200 mg daily; for best absorption, if taking supplements, divide doses into 250-500 mg doses throughout the day. Of the two types of calcium, calcium carbonate is best absorbed when taken with food but calcium citrate can be taken on an empty stomach.  Get enough vitamin D. NAMS and the National Osteoporosis Foundation recommend at least 1,000 IU per day for women age 50 and over who are at risk of vitamin D  deficiency. Vitamin D deficiency can be caused by inadequate sun exposure (for example, those who live in northern latitudes).  Avoid alcohol and smoking. Heavy alcohol intake (more than 7 drinks per week) increases the risk of falls and hip fracture and women smokers tend to lose bone more rapidly and have lower bone mass than nonsmokers. Stopping smoking is one of the most important changes women can make to improve their health and decrease risk for disease.  Be physically active every day. Weight-bearing exercise (for example, fast walking, hiking, jogging, and weight training) may strengthen bones or slow the rate of bone loss that comes with aging. Balancing and muscle-strengthening exercises can reduce the risk of falling and fracture.  Consider therapeutic medications. Currently, several types of effective drugs are available. Healthcare providers can recommend the type most appropriate for each woman.  Eliminate environmental factors that may contribute to accidents. Falls cause nearly 90% of all osteoporotic fractures, so reducing this risk is an important bone-health strategy. Measures include ample lighting, removing obstructions to walking, using nonskid rugs on floors, and placing mats and/or grab bars in showers.  Be aware of medication side effects. Some common medicines make bones weaker. These include a type of steroid drug called glucocorticoids used for arthritis and asthma, some antiseizure drugs, certain sleeping pills, treatments for endometriosis, and some cancer drugs. An overactive thyroid gland or using too much thyroid hormone for an underactive thyroid can also be a problem. If you are taking these medicines, talk to your doctor about what you can do to help protect your bones.reduce the rate of osteoporosis.    Menopause can be associated with physical symptoms and risks. Hormone replacement therapy is available to decrease symptoms and risks. You should talk to your  health care provider   about whether hormone replacement therapy is right for you.   Use sunscreen. Apply sunscreen liberally and repeatedly throughout the day. You should seek shade when your shadow is shorter than you. Protect yourself by wearing long sleeves, pants, a wide-brimmed hat, and sunglasses year round, whenever you are outdoors.   Once a month, do a whole body skin exam, using a mirror to look at the skin on your back. Tell your health care provider of new moles, moles that have irregular borders, moles that are larger than a pencil eraser, or moles that have changed in shape or color.   -Stay current with required vaccines (immunizations).   Influenza vaccine. All adults should be immunized every year.  Tetanus, diphtheria, and acellular pertussis (Td, Tdap) vaccine. Pregnant women should receive 1 dose of Tdap vaccine during each pregnancy. The dose should be obtained regardless of the length of time since the last dose. Immunization is preferred during the 27th 36th week of gestation. An adult who has not previously received Tdap or who does not know her vaccine status should receive 1 dose of Tdap. This initial dose should be followed by tetanus and diphtheria toxoids (Td) booster doses every 10 years. Adults with an unknown or incomplete history of completing a 3-dose immunization series with Td-containing vaccines should begin or complete a primary immunization series including a Tdap dose. Adults should receive a Td booster every 10 years.  Varicella vaccine. An adult without evidence of immunity to varicella should receive 2 doses or a second dose if she has previously received 1 dose. Pregnant females who do not have evidence of immunity should receive the first dose after pregnancy. This first dose should be obtained before leaving the health care facility. The second dose should be obtained 4 8 weeks after the first dose.  Human papillomavirus (HPV) vaccine. Females aged 13 26  years who have not received the vaccine previously should obtain the 3-dose series. The vaccine is not recommended for use in pregnant females. However, pregnancy testing is not needed before receiving a dose. If a female is found to be pregnant after receiving a dose, no treatment is needed. In that case, the remaining doses should be delayed until after the pregnancy. Immunization is recommended for any person with an immunocompromised condition through the age of 26 years if she did not get any or all doses earlier. During the 3-dose series, the second dose should be obtained 4 8 weeks after the first dose. The third dose should be obtained 24 weeks after the first dose and 16 weeks after the second dose.  Zoster vaccine. One dose is recommended for adults aged 60 years or older unless certain conditions are present.  Measles, mumps, and rubella (MMR) vaccine. Adults born before 1957 generally are considered immune to measles and mumps. Adults born in 1957 or later should have 1 or more doses of MMR vaccine unless there is a contraindication to the vaccine or there is laboratory evidence of immunity to each of the three diseases. A routine second dose of MMR vaccine should be obtained at least 28 days after the first dose for students attending postsecondary schools, health care workers, or international travelers. People who received inactivated measles vaccine or an unknown type of measles vaccine during 1963 1967 should receive 2 doses of MMR vaccine. People who received inactivated mumps vaccine or an unknown type of mumps vaccine before 1979 and are at high risk for mumps infection should consider immunization with 2 doses of   MMR vaccine. For females of childbearing age, rubella immunity should be determined. If there is no evidence of immunity, females who are not pregnant should be vaccinated. If there is no evidence of immunity, females who are pregnant should delay immunization until after pregnancy.  Unvaccinated health care workers born before [birth year] who lack laboratory evidence of measles, mumps, or rubella immunity or laboratory confirmation of disease should consider measles and mumps immunization with 2 doses of MMR vaccine or rubella immunization with 1 dose of MMR vaccine.  Pneumococcal 13-valent conjugate (PCV13) vaccine. When indicated, a person who is uncertain of her immunization history and has no record of immunization should receive the PCV13 vaccine. An adult aged 54 years or older who has certain medical conditions and has not been previously immunized should receive 1 dose of PCV13 vaccine. This PCV13 should be followed with a dose of pneumococcal polysaccharide (PPSV23) vaccine. The PPSV23 vaccine dose should be obtained at least 8 weeks after the dose of PCV13 vaccine. An adult aged 58 years or older who has certain medical conditions and previously received 1 or more doses of PPSV23 vaccine should receive 1 dose of PCV13. The PCV13 vaccine dose should be obtained 1 or more years after the last PPSV23 vaccine dose.  Pneumococcal polysaccharide (PPSV23) vaccine. When PCV13 is also indicated, PCV13 should be obtained first. All adults aged 58 years and older should be immunized. An adult younger than age 65 years who has certain medical conditions should be immunized. Any person who resides in a nursing home or long-term care facility should be immunized. An adult smoker should be immunized. People with an immunocompromised condition and certain other conditions should receive both PCV13 and PPSV23 vaccines. People with human immunodeficiency virus (HIV) infection should be immunized as soon as possible after diagnosis. Immunization during chemotherapy or radiation therapy should be avoided. Routine use of PPSV23 vaccine is not recommended for American Indians, [place] Natives, or people younger than 65 years unless there are medical conditions that require PPSV23 vaccine. When indicated,  people who have unknown immunization and have no record of immunization should receive PPSV23 vaccine. One-time revaccination 5 years after the first dose of PPSV23 is recommended for people aged 70 64 years who have chronic kidney failure, nephrotic syndrome, asplenia, or immunocompromised conditions. People who received 1 2 doses of PPSV23 before age 32 years should receive another dose of PPSV23 vaccine at age 96 years or later if at least 5 years have passed since the previous dose. Doses of PPSV23 are not needed for people immunized with PPSV23 at or after age 55 years.  Meningococcal vaccine. Adults with asplenia or persistent complement component deficiencies should receive 2 doses of quadrivalent meningococcal conjugate (MenACWY-D) vaccine. The doses should be obtained at least 2 months apart. Microbiologists working with certain meningococcal bacteria, [employer] recruits, people at risk during an outbreak, and people who travel to or live in countries with a high rate of meningitis should be immunized. A first-year college student up through age 58 years who is living in a residence hall should receive a dose if she did not receive a dose on or after her 16th birthday. Adults who have certain high-risk conditions should receive one or more doses of vaccine.  Hepatitis A vaccine. Adults who wish to be protected from this disease, have certain high-risk conditions, work with hepatitis A-infected animals, work in hepatitis A research labs, or travel to or work in countries with a high rate of hepatitis A should be  immunized. Adults who were previously unvaccinated and who anticipate close contact with an international adoptee during the first 60 days after arrival in the [place] from a country with a high rate of hepatitis A should be immunized.  Hepatitis B vaccine.  Adults who wish to be protected from this disease, have certain high-risk conditions, may be exposed to blood or other infectious  body fluids, are household contacts or sex partners of hepatitis B positive people, are clients or workers in certain care facilities, or travel to or work in countries with a high rate of hepatitis B should be immunized.  Haemophilus influenzae type b (Hib) vaccine. A previously unvaccinated person with asplenia or sickle cell disease or having a scheduled splenectomy should receive 1 dose of Hib vaccine. Regardless of previous immunization, a recipient of a hematopoietic stem cell transplant should receive a 3-dose series 6 12 months after her successful transplant. Hib vaccine is not recommended for adults with HIV infection.  Preventive Services / Frequency Ages 6 to 39years  Blood pressure check.** / Every 1 to 2 years.  Lipid and cholesterol check.** / Every 5 years beginning at age 39.  Clinical breast exam.** / Every 3 years for women in their 61s and 62s.  BRCA-related cancer risk assessment.** / For women who have family members with a BRCA-related cancer (breast, ovarian, tubal, or peritoneal cancers).  Pap test.** / Every 2 years from ages 47 through 85. Every 3 years starting at age 34 through age 12 or 74 with a history of 3 consecutive normal Pap tests.  HPV screening.** / Every 3 years from ages 46 through ages 43 to 54 with a history of 3 consecutive normal Pap tests.  Hepatitis C blood test.** / For any individual with known risks for hepatitis C.  Skin self-exam. / Monthly.  Influenza vaccine. / Every year.  Tetanus, diphtheria, and acellular pertussis (Tdap, Td) vaccine.** / Consult your health care provider. Pregnant women should receive 1 dose of Tdap vaccine during each pregnancy. 1 dose of Td every 10 years.  Varicella vaccine.** / Consult your health care provider. Pregnant females who do not have evidence of immunity should receive the first dose after pregnancy.  HPV vaccine. / 3 doses over 6 months, if 64 and younger. The vaccine is not recommended for use in  pregnant females. However, pregnancy testing is not needed before receiving a dose.  Measles, mumps, rubella (MMR) vaccine.** / You need at least 1 dose of MMR if you were born in 1957 or later. You may also need a 2nd dose. For females of childbearing age, rubella immunity should be determined. If there is no evidence of immunity, females who are not pregnant should be vaccinated. If there is no evidence of immunity, females who are pregnant should delay immunization until after pregnancy.  Pneumococcal 13-valent conjugate (PCV13) vaccine.** / Consult your health care provider.  Pneumococcal polysaccharide (PPSV23) vaccine.** / 1 to 2 doses if you smoke cigarettes or if you have certain conditions.  Meningococcal vaccine.** / 1 dose if you are age 71 to 37 years and a Market researcher living in a residence hall, or have one of several medical conditions, you need to get vaccinated against meningococcal disease. You may also need additional booster doses.  Hepatitis A vaccine.** / Consult your health care provider.  Hepatitis B vaccine.** / Consult your health care provider.  Haemophilus influenzae type b (Hib) vaccine.** / Consult your health care provider.  Ages 55 to 64years  Blood pressure check.** / Every 1 to 2 years.  Lipid and cholesterol check.** / Every 5 years beginning at age 20 years.  Lung cancer screening. / Every year if you are aged 55 80 years and have a 30-pack-year history of smoking and currently smoke or have quit within the past 15 years. Yearly screening is stopped once you have quit smoking for at least 15 years or develop a health problem that would prevent you from having lung cancer treatment.  Clinical breast exam.** / Every year after age 40 years.  BRCA-related cancer risk assessment.** / For women who have family members with a BRCA-related cancer (breast, ovarian, tubal, or peritoneal cancers).  Mammogram.** / Every year beginning at age 40  years and continuing for as long as you are in good health. Consult with your health care provider.  Pap test.** / Every 3 years starting at age 30 years through age 65 or 70 years with a history of 3 consecutive normal Pap tests.  HPV screening.** / Every 3 years from ages 30 years through ages 65 to 70 years with a history of 3 consecutive normal Pap tests.  Fecal occult blood test (FOBT) of stool. / Every year beginning at age 50 years and continuing until age 75 years. You may not need to do this test if you get a colonoscopy every 10 years.  Flexible sigmoidoscopy or colonoscopy.** / Every 5 years for a flexible sigmoidoscopy or every 10 years for a colonoscopy beginning at age 50 years and continuing until age 75 years.  Hepatitis C blood test.** / For all people born from 1945 through 1965 and any individual with known risks for hepatitis C.  Skin self-exam. / Monthly.  Influenza vaccine. / Every year.  Tetanus, diphtheria, and acellular pertussis (Tdap/Td) vaccine.** / Consult your health care provider. Pregnant women should receive 1 dose of Tdap vaccine during each pregnancy. 1 dose of Td every 10 years.  Varicella vaccine.** / Consult your health care provider. Pregnant females who do not have evidence of immunity should receive the first dose after pregnancy.  Zoster vaccine.** / 1 dose for adults aged 60 years or older.  Measles, mumps, rubella (MMR) vaccine.** / You need at least 1 dose of MMR if you were born in 1957 or later. You may also need a 2nd dose. For females of childbearing age, rubella immunity should be determined. If there is no evidence of immunity, females who are not pregnant should be vaccinated. If there is no evidence of immunity, females who are pregnant should delay immunization until after pregnancy.  Pneumococcal 13-valent conjugate (PCV13) vaccine.** / Consult your health care provider.  Pneumococcal polysaccharide (PPSV23) vaccine.** / 1 to 2 doses if  you smoke cigarettes or if you have certain conditions.  Meningococcal vaccine.** / Consult your health care provider.  Hepatitis A vaccine.** / Consult your health care provider.  Hepatitis B vaccine.** / Consult your health care provider.  Haemophilus influenzae type b (Hib) vaccine.** / Consult your health care provider.  Ages 65 years and over  Blood pressure check.** / Every 1 to 2 years.  Lipid and cholesterol check.** / Every 5 years beginning at age 20 years.  Lung cancer screening. / Every year if you are aged 55 80 years and have a 30-pack-year history of smoking and currently smoke or have quit within the past 15 years. Yearly screening is stopped once you have quit smoking for at least 15 years or develop a health problem that   would prevent you from having lung cancer treatment.  Clinical breast exam.** / Every year after age 103 years.  BRCA-related cancer risk assessment.** / For women who have family members with a BRCA-related cancer (breast, ovarian, tubal, or peritoneal cancers).  Mammogram.** / Every year beginning at age 36 years and continuing for as long as you are in good health. Consult with your health care provider.  Pap test.** / Every 3 years starting at age 5 years through age 85 or 10 years with 3 consecutive normal Pap tests. Testing can be stopped between 65 and 70 years with 3 consecutive normal Pap tests and no abnormal Pap or HPV tests in the past 10 years.  HPV screening.** / Every 3 years from ages 93 years through ages 70 or 45 years with a history of 3 consecutive normal Pap tests. Testing can be stopped between 65 and 70 years with 3 consecutive normal Pap tests and no abnormal Pap or HPV tests in the past 10 years.  Fecal occult blood test (FOBT) of stool. / Every year beginning at age 8 years and continuing until age 45 years. You may not need to do this test if you get a colonoscopy every 10 years.  Flexible sigmoidoscopy or colonoscopy.** /  Every 5 years for a flexible sigmoidoscopy or every 10 years for a colonoscopy beginning at age 69 years and continuing until age 68 years.  Hepatitis C blood test.** / For all people born from [birth year] through 1965 and any individual with known risks for hepatitis C.  Osteoporosis screening.** / A one-time screening for women ages 7 years and over and women at risk for fractures or osteoporosis.  Skin self-exam. / Monthly.  Influenza vaccine. / Every year.  Tetanus, diphtheria, and acellular pertussis (Tdap/Td) vaccine.** / 1 dose of Td every 10 years.  Varicella vaccine.** / Consult your health care provider.  Zoster vaccine.** / 1 dose for adults aged 5 years or older.  Pneumococcal 13-valent conjugate (PCV13) vaccine.** / Consult your health care provider.  Pneumococcal polysaccharide (PPSV23) vaccine.** / 1 dose for all adults aged 74 years and older.  Meningococcal vaccine.** / Consult your health care provider.  Hepatitis A vaccine.** / Consult your health care provider.  Hepatitis B vaccine.** / Consult your health care provider.  Haemophilus influenzae type b (Hib) vaccine.** / Consult your health care provider. ** Family history and personal history of risk and conditions may change your health care provider's recommendations. Document Released: 07/31/2001 Document Revised: 03/25/2013  [facility] Patient Information 2014 [place], [place].   EXERCISE AND DIET:  We recommended that you start or continue a regular exercise program for good health. Regular exercise means any activity that makes your heart beat faster and makes you sweat.  We recommend exercising at least 30 minutes per day at least 3 days a week, preferably 5.  We also recommend a diet low in fat and sugar / carbohydrates.  Inactivity, poor dietary choices and obesity can cause diabetes, heart attack, stroke, and kidney damage, among others.     ALCOHOL AND SMOKING:  Women should limit their alcohol intake to no  more than 7 drinks/beers/glasses of wine (combined, not each!) per week. Moderation of alcohol intake to this level decreases your risk of breast cancer and liver damage.  ( And of course, no recreational drugs are part of a healthy lifestyle.)  Also, you should not be smoking at all or even being exposed to second hand smoke. Most people know smoking can  cause cancer, and various heart and lung diseases, but did you know it also contributes to weakening of your bones?  Aging of your skin?  Yellowing of your teeth and nails?   CALCIUM AND VITAMIN D:  Adequate intake of calcium and Vitamin D are recommended.  The recommendations for exact amounts of these supplements seem to change often, but generally speaking 600 mg of calcium (either carbonate or citrate) and 800 units of Vitamin D per day seems prudent. Certain women may benefit from higher intake of Vitamin D.  If you are among these women, your doctor will have told you during your visit.     PAP SMEARS:  Pap smears, to check for cervical cancer or precancers,  have traditionally been done yearly, although recent scientific advances have shown that most women can have pap smears less often.  However, every woman still should have a physical exam from her gynecologist or primary care physician every year. It will include a breast check, inspection of the vulva and vagina to check for abnormal growths or skin changes, a visual exam of the cervix, and then an exam to evaluate the size and shape of the uterus and ovaries.  And after 38 years of age, a rectal exam is indicated to check for rectal cancers. We will also provide age appropriate advice regarding health maintenance, like when you should have certain vaccines, screening for sexually transmitted diseases, bone density testing, colonoscopy, mammograms, etc.    MAMMOGRAMS:  All women over 36 years old should have a yearly mammogram. Many facilities now offer a "3D" mammogram, which may cost  around $50 extra out of pocket. If possible,  we recommend you accept the option to have the 3D mammogram performed.  It both reduces the number of women who will be called back for extra views which then turn out to be normal, and it is better than the routine mammogram at detecting truly abnormal areas.     COLONOSCOPY:  Colonoscopy to screen for colon cancer is recommended for all women at age 61.  We know, you hate the idea of the prep.  We agree, BUT, having colon cancer and not knowing it is worse!!  Colon cancer so often starts as a polyp that can be seen and removed at colonscopy, which can quite literally save your life!  And if your first colonoscopy is normal and you have no family history of colon cancer, most women don't have to have it again for 10 years.  Once every ten years, you can do something that may end up saving your life, right?  We will be happy to help you get it scheduled when you are ready.  Be sure to check your insurance coverage so you understand how much it will cost.  It may be covered as a preventative service at no cost, but you should check your particular policy.    Constipation, Adult Constipation is when a person has fewer bowel movements in a week than normal, has difficulty having a bowel movement, or has stools that are dry, hard, or larger than normal. Constipation may be caused by an underlying condition. It may become worse with age if a person takes certain medicines and does not take in enough fluids. Follow these instructions at home: Eating and drinking   Eat foods that have a lot of fiber, such as fresh fruits and vegetables, whole grains, and beans.  Limit foods that are high in fat, low in fiber, or overly  processed, such as french fries, hamburgers, cookies, candies, and soda.  Drink enough fluid to keep your urine clear or pale yellow. General instructions  Exercise regularly or as told by your health care provider.  Go to the restroom when  you have the urge to go. Do not hold it in.  Take over-the-counter and prescription medicines only as told by your health care provider. These include any fiber supplements.  Practice pelvic floor retraining exercises, such as deep breathing while relaxing the lower abdomen and pelvic floor relaxation during bowel movements.  Watch your condition for any changes.  Keep all follow-up visits as told by your health care provider. This is important. Contact a health care provider if:  You have pain that gets worse.  You have a fever.  You do not have a bowel movement after 4 days.  You vomit.  You are not hungry.  You lose weight.  You are bleeding from the anus.  You have thin, pencil-like stools. Get help right away if:  You have a fever and your symptoms suddenly get worse.  You leak stool or have blood in your stool.  Your abdomen is bloated.  You have severe pain in your abdomen.  You feel dizzy or you faint. This information is not intended to replace advice given to you by your health care provider. Make sure you discuss any questions you have with your health care provider. Document Released: 03/02/2004 Document Revised: 05/17/2017 Document Reviewed: 11/23/2015 Elsevier Patient Education  2020 [place] your excellent water intake, regular exercise, recommend reducing saturated fat intake (ie. Cheese, red meat, whole milk). Recommend incorporating Probiotic-,ie. Gundry MD If you would like a referral to Gastroenterologist, please call clinic. Continue with OB/GYN as directed. Recommend annual physical with fasting labs. Continue to social distance and wear a mask when in public. GREAT TO SEE YOU!

## 2018-12-25 NOTE — Assessment & Plan Note (Signed)
She continues to experience constipation despite drinking >gallon water/day, diet rich in fruits/vegetables, vigorous daily exercise (2 hrs /day- weight lifting and cardio). She stopped using the Linzess 182mcg QD- wasn't producing daily BMs She denies hematochezia She reports frequent abdominal bloating- she is not using OTC Probiotic

## 2018-12-25 NOTE — Assessment & Plan Note (Signed)
Continue your excellent water intake, regular exercise, recommend reducing saturated fat intake (ie. Cheese, red meat, whole milk). Recommend incorporating Probiotic-,ie. Gundry MD If you would like a referral to Gastroenterologist, please call clinic. Continue with OB/GYN as directed. Recommend annual physical with fasting labs. Continue to social distance and wear a mask when in public.

## 2018-12-30 DIAGNOSIS — J3089 Other allergic rhinitis: Secondary | ICD-10-CM | POA: Diagnosis not present

## 2018-12-30 DIAGNOSIS — J301 Allergic rhinitis due to pollen: Secondary | ICD-10-CM | POA: Diagnosis not present

## 2018-12-30 DIAGNOSIS — J3081 Allergic rhinitis due to animal (cat) (dog) hair and dander: Secondary | ICD-10-CM | POA: Diagnosis not present

## 2019-01-06 DIAGNOSIS — J3089 Other allergic rhinitis: Secondary | ICD-10-CM | POA: Diagnosis not present

## 2019-01-06 DIAGNOSIS — J3081 Allergic rhinitis due to animal (cat) (dog) hair and dander: Secondary | ICD-10-CM | POA: Diagnosis not present

## 2019-01-06 DIAGNOSIS — J301 Allergic rhinitis due to pollen: Secondary | ICD-10-CM | POA: Diagnosis not present

## 2019-01-09 DIAGNOSIS — J3081 Allergic rhinitis due to animal (cat) (dog) hair and dander: Secondary | ICD-10-CM | POA: Diagnosis not present

## 2019-01-09 DIAGNOSIS — J3089 Other allergic rhinitis: Secondary | ICD-10-CM | POA: Diagnosis not present

## 2019-01-09 DIAGNOSIS — J301 Allergic rhinitis due to pollen: Secondary | ICD-10-CM | POA: Diagnosis not present

## 2019-01-16 DIAGNOSIS — J3089 Other allergic rhinitis: Secondary | ICD-10-CM | POA: Diagnosis not present

## 2019-01-16 DIAGNOSIS — J3081 Allergic rhinitis due to animal (cat) (dog) hair and dander: Secondary | ICD-10-CM | POA: Diagnosis not present

## 2019-01-16 DIAGNOSIS — J301 Allergic rhinitis due to pollen: Secondary | ICD-10-CM | POA: Diagnosis not present

## 2019-01-20 DIAGNOSIS — J3089 Other allergic rhinitis: Secondary | ICD-10-CM | POA: Diagnosis not present

## 2019-01-20 DIAGNOSIS — J301 Allergic rhinitis due to pollen: Secondary | ICD-10-CM | POA: Diagnosis not present

## 2019-02-03 DIAGNOSIS — J301 Allergic rhinitis due to pollen: Secondary | ICD-10-CM | POA: Diagnosis not present

## 2019-02-03 DIAGNOSIS — J3089 Other allergic rhinitis: Secondary | ICD-10-CM | POA: Diagnosis not present

## 2019-02-03 DIAGNOSIS — J3081 Allergic rhinitis due to animal (cat) (dog) hair and dander: Secondary | ICD-10-CM | POA: Diagnosis not present

## 2019-02-10 DIAGNOSIS — J3089 Other allergic rhinitis: Secondary | ICD-10-CM | POA: Diagnosis not present

## 2019-02-10 DIAGNOSIS — J301 Allergic rhinitis due to pollen: Secondary | ICD-10-CM | POA: Diagnosis not present

## 2019-02-10 DIAGNOSIS — J3081 Allergic rhinitis due to animal (cat) (dog) hair and dander: Secondary | ICD-10-CM | POA: Diagnosis not present

## 2019-02-17 DIAGNOSIS — J3081 Allergic rhinitis due to animal (cat) (dog) hair and dander: Secondary | ICD-10-CM | POA: Diagnosis not present

## 2019-02-17 DIAGNOSIS — J301 Allergic rhinitis due to pollen: Secondary | ICD-10-CM | POA: Diagnosis not present

## 2019-02-17 DIAGNOSIS — J3089 Other allergic rhinitis: Secondary | ICD-10-CM | POA: Diagnosis not present

## 2019-02-27 DIAGNOSIS — J3081 Allergic rhinitis due to animal (cat) (dog) hair and dander: Secondary | ICD-10-CM | POA: Diagnosis not present

## 2019-02-27 DIAGNOSIS — J301 Allergic rhinitis due to pollen: Secondary | ICD-10-CM | POA: Diagnosis not present

## 2019-02-27 DIAGNOSIS — J3089 Other allergic rhinitis: Secondary | ICD-10-CM | POA: Diagnosis not present

## 2019-03-03 DIAGNOSIS — Z6832 Body mass index (BMI) 32.0-32.9, adult: Secondary | ICD-10-CM | POA: Diagnosis not present

## 2019-03-03 DIAGNOSIS — Z01419 Encounter for gynecological examination (general) (routine) without abnormal findings: Secondary | ICD-10-CM | POA: Diagnosis not present

## 2019-03-03 DIAGNOSIS — Z124 Encounter for screening for malignant neoplasm of cervix: Secondary | ICD-10-CM | POA: Diagnosis not present

## 2019-03-03 LAB — HM PAP SMEAR

## 2019-03-05 DIAGNOSIS — J3081 Allergic rhinitis due to animal (cat) (dog) hair and dander: Secondary | ICD-10-CM | POA: Diagnosis not present

## 2019-03-05 DIAGNOSIS — J3089 Other allergic rhinitis: Secondary | ICD-10-CM | POA: Diagnosis not present

## 2019-03-05 DIAGNOSIS — J301 Allergic rhinitis due to pollen: Secondary | ICD-10-CM | POA: Diagnosis not present

## 2019-03-10 DIAGNOSIS — J3089 Other allergic rhinitis: Secondary | ICD-10-CM | POA: Diagnosis not present

## 2019-03-10 DIAGNOSIS — J3081 Allergic rhinitis due to animal (cat) (dog) hair and dander: Secondary | ICD-10-CM | POA: Diagnosis not present

## 2019-03-10 DIAGNOSIS — J301 Allergic rhinitis due to pollen: Secondary | ICD-10-CM | POA: Diagnosis not present

## 2019-03-17 DIAGNOSIS — J301 Allergic rhinitis due to pollen: Secondary | ICD-10-CM | POA: Diagnosis not present

## 2019-03-17 DIAGNOSIS — J3089 Other allergic rhinitis: Secondary | ICD-10-CM | POA: Diagnosis not present

## 2019-03-17 DIAGNOSIS — J3081 Allergic rhinitis due to animal (cat) (dog) hair and dander: Secondary | ICD-10-CM | POA: Diagnosis not present

## 2019-03-24 DIAGNOSIS — J301 Allergic rhinitis due to pollen: Secondary | ICD-10-CM | POA: Diagnosis not present

## 2019-03-24 DIAGNOSIS — J3089 Other allergic rhinitis: Secondary | ICD-10-CM | POA: Diagnosis not present

## 2019-03-24 DIAGNOSIS — J3081 Allergic rhinitis due to animal (cat) (dog) hair and dander: Secondary | ICD-10-CM | POA: Diagnosis not present

## 2019-04-02 DIAGNOSIS — J3089 Other allergic rhinitis: Secondary | ICD-10-CM | POA: Diagnosis not present

## 2019-04-02 DIAGNOSIS — J3081 Allergic rhinitis due to animal (cat) (dog) hair and dander: Secondary | ICD-10-CM | POA: Diagnosis not present

## 2019-04-02 DIAGNOSIS — J301 Allergic rhinitis due to pollen: Secondary | ICD-10-CM | POA: Diagnosis not present

## 2019-04-07 DIAGNOSIS — J3081 Allergic rhinitis due to animal (cat) (dog) hair and dander: Secondary | ICD-10-CM | POA: Diagnosis not present

## 2019-04-07 DIAGNOSIS — J301 Allergic rhinitis due to pollen: Secondary | ICD-10-CM | POA: Diagnosis not present

## 2019-04-07 DIAGNOSIS — J3089 Other allergic rhinitis: Secondary | ICD-10-CM | POA: Diagnosis not present

## 2019-04-14 DIAGNOSIS — J3089 Other allergic rhinitis: Secondary | ICD-10-CM | POA: Diagnosis not present

## 2019-04-14 DIAGNOSIS — J3081 Allergic rhinitis due to animal (cat) (dog) hair and dander: Secondary | ICD-10-CM | POA: Diagnosis not present

## 2019-04-14 DIAGNOSIS — J301 Allergic rhinitis due to pollen: Secondary | ICD-10-CM | POA: Diagnosis not present

## 2019-04-21 DIAGNOSIS — J3081 Allergic rhinitis due to animal (cat) (dog) hair and dander: Secondary | ICD-10-CM | POA: Diagnosis not present

## 2019-04-21 DIAGNOSIS — J301 Allergic rhinitis due to pollen: Secondary | ICD-10-CM | POA: Diagnosis not present

## 2019-04-21 DIAGNOSIS — J3089 Other allergic rhinitis: Secondary | ICD-10-CM | POA: Diagnosis not present

## 2019-04-28 DIAGNOSIS — J3089 Other allergic rhinitis: Secondary | ICD-10-CM | POA: Diagnosis not present

## 2019-04-28 DIAGNOSIS — J301 Allergic rhinitis due to pollen: Secondary | ICD-10-CM | POA: Diagnosis not present

## 2019-04-28 DIAGNOSIS — J3081 Allergic rhinitis due to animal (cat) (dog) hair and dander: Secondary | ICD-10-CM | POA: Diagnosis not present

## 2019-05-05 DIAGNOSIS — J3089 Other allergic rhinitis: Secondary | ICD-10-CM | POA: Diagnosis not present

## 2019-05-05 DIAGNOSIS — J301 Allergic rhinitis due to pollen: Secondary | ICD-10-CM | POA: Diagnosis not present

## 2019-05-05 DIAGNOSIS — J3081 Allergic rhinitis due to animal (cat) (dog) hair and dander: Secondary | ICD-10-CM | POA: Diagnosis not present

## 2019-05-19 DIAGNOSIS — J3089 Other allergic rhinitis: Secondary | ICD-10-CM | POA: Diagnosis not present

## 2019-05-19 DIAGNOSIS — J3081 Allergic rhinitis due to animal (cat) (dog) hair and dander: Secondary | ICD-10-CM | POA: Diagnosis not present

## 2019-05-19 DIAGNOSIS — J301 Allergic rhinitis due to pollen: Secondary | ICD-10-CM | POA: Diagnosis not present

## 2019-05-26 DIAGNOSIS — J3089 Other allergic rhinitis: Secondary | ICD-10-CM | POA: Diagnosis not present

## 2019-05-26 DIAGNOSIS — J301 Allergic rhinitis due to pollen: Secondary | ICD-10-CM | POA: Diagnosis not present

## 2019-05-26 DIAGNOSIS — J3081 Allergic rhinitis due to animal (cat) (dog) hair and dander: Secondary | ICD-10-CM | POA: Diagnosis not present

## 2019-06-02 DIAGNOSIS — J301 Allergic rhinitis due to pollen: Secondary | ICD-10-CM | POA: Diagnosis not present

## 2019-06-02 DIAGNOSIS — J3081 Allergic rhinitis due to animal (cat) (dog) hair and dander: Secondary | ICD-10-CM | POA: Diagnosis not present

## 2019-06-02 DIAGNOSIS — J3089 Other allergic rhinitis: Secondary | ICD-10-CM | POA: Diagnosis not present

## 2019-06-03 DIAGNOSIS — J3081 Allergic rhinitis due to animal (cat) (dog) hair and dander: Secondary | ICD-10-CM | POA: Diagnosis not present

## 2019-06-03 DIAGNOSIS — J3089 Other allergic rhinitis: Secondary | ICD-10-CM | POA: Diagnosis not present

## 2019-06-03 DIAGNOSIS — J301 Allergic rhinitis due to pollen: Secondary | ICD-10-CM | POA: Diagnosis not present

## 2019-06-16 DIAGNOSIS — J301 Allergic rhinitis due to pollen: Secondary | ICD-10-CM | POA: Diagnosis not present

## 2019-06-16 DIAGNOSIS — J3081 Allergic rhinitis due to animal (cat) (dog) hair and dander: Secondary | ICD-10-CM | POA: Diagnosis not present

## 2019-06-16 DIAGNOSIS — J3089 Other allergic rhinitis: Secondary | ICD-10-CM | POA: Diagnosis not present

## 2019-06-23 DIAGNOSIS — J3081 Allergic rhinitis due to animal (cat) (dog) hair and dander: Secondary | ICD-10-CM | POA: Diagnosis not present

## 2019-06-23 DIAGNOSIS — J301 Allergic rhinitis due to pollen: Secondary | ICD-10-CM | POA: Diagnosis not present

## 2019-06-23 DIAGNOSIS — J3089 Other allergic rhinitis: Secondary | ICD-10-CM | POA: Diagnosis not present

## 2019-06-30 DIAGNOSIS — J3081 Allergic rhinitis due to animal (cat) (dog) hair and dander: Secondary | ICD-10-CM | POA: Diagnosis not present

## 2019-06-30 DIAGNOSIS — J301 Allergic rhinitis due to pollen: Secondary | ICD-10-CM | POA: Diagnosis not present

## 2019-06-30 DIAGNOSIS — J3089 Other allergic rhinitis: Secondary | ICD-10-CM | POA: Diagnosis not present

## 2019-07-07 DIAGNOSIS — J301 Allergic rhinitis due to pollen: Secondary | ICD-10-CM | POA: Diagnosis not present

## 2019-07-07 DIAGNOSIS — J3089 Other allergic rhinitis: Secondary | ICD-10-CM | POA: Diagnosis not present

## 2019-07-07 DIAGNOSIS — J3081 Allergic rhinitis due to animal (cat) (dog) hair and dander: Secondary | ICD-10-CM | POA: Diagnosis not present

## 2019-07-14 DIAGNOSIS — J301 Allergic rhinitis due to pollen: Secondary | ICD-10-CM | POA: Diagnosis not present

## 2019-07-14 DIAGNOSIS — J3081 Allergic rhinitis due to animal (cat) (dog) hair and dander: Secondary | ICD-10-CM | POA: Diagnosis not present

## 2019-07-14 DIAGNOSIS — J3089 Other allergic rhinitis: Secondary | ICD-10-CM | POA: Diagnosis not present

## 2019-07-21 DIAGNOSIS — J3081 Allergic rhinitis due to animal (cat) (dog) hair and dander: Secondary | ICD-10-CM | POA: Diagnosis not present

## 2019-07-21 DIAGNOSIS — J3089 Other allergic rhinitis: Secondary | ICD-10-CM | POA: Diagnosis not present

## 2019-07-21 DIAGNOSIS — J301 Allergic rhinitis due to pollen: Secondary | ICD-10-CM | POA: Diagnosis not present

## 2019-07-26 DIAGNOSIS — Z20828 Contact with and (suspected) exposure to other viral communicable diseases: Secondary | ICD-10-CM | POA: Diagnosis not present

## 2019-07-28 DIAGNOSIS — J301 Allergic rhinitis due to pollen: Secondary | ICD-10-CM | POA: Diagnosis not present

## 2019-07-28 DIAGNOSIS — J3081 Allergic rhinitis due to animal (cat) (dog) hair and dander: Secondary | ICD-10-CM | POA: Diagnosis not present

## 2019-07-28 DIAGNOSIS — J3089 Other allergic rhinitis: Secondary | ICD-10-CM | POA: Diagnosis not present

## 2019-08-01 IMAGING — DX DG WRIST COMPLETE 3+V*R*
3 series · 3 of 3 positions shown · non-contrast
Comparison: None.

CLINICAL DATA: Pain

EXAM:
RIGHT WRIST - COMPLETE 3+ VIEW

[wrist pa]
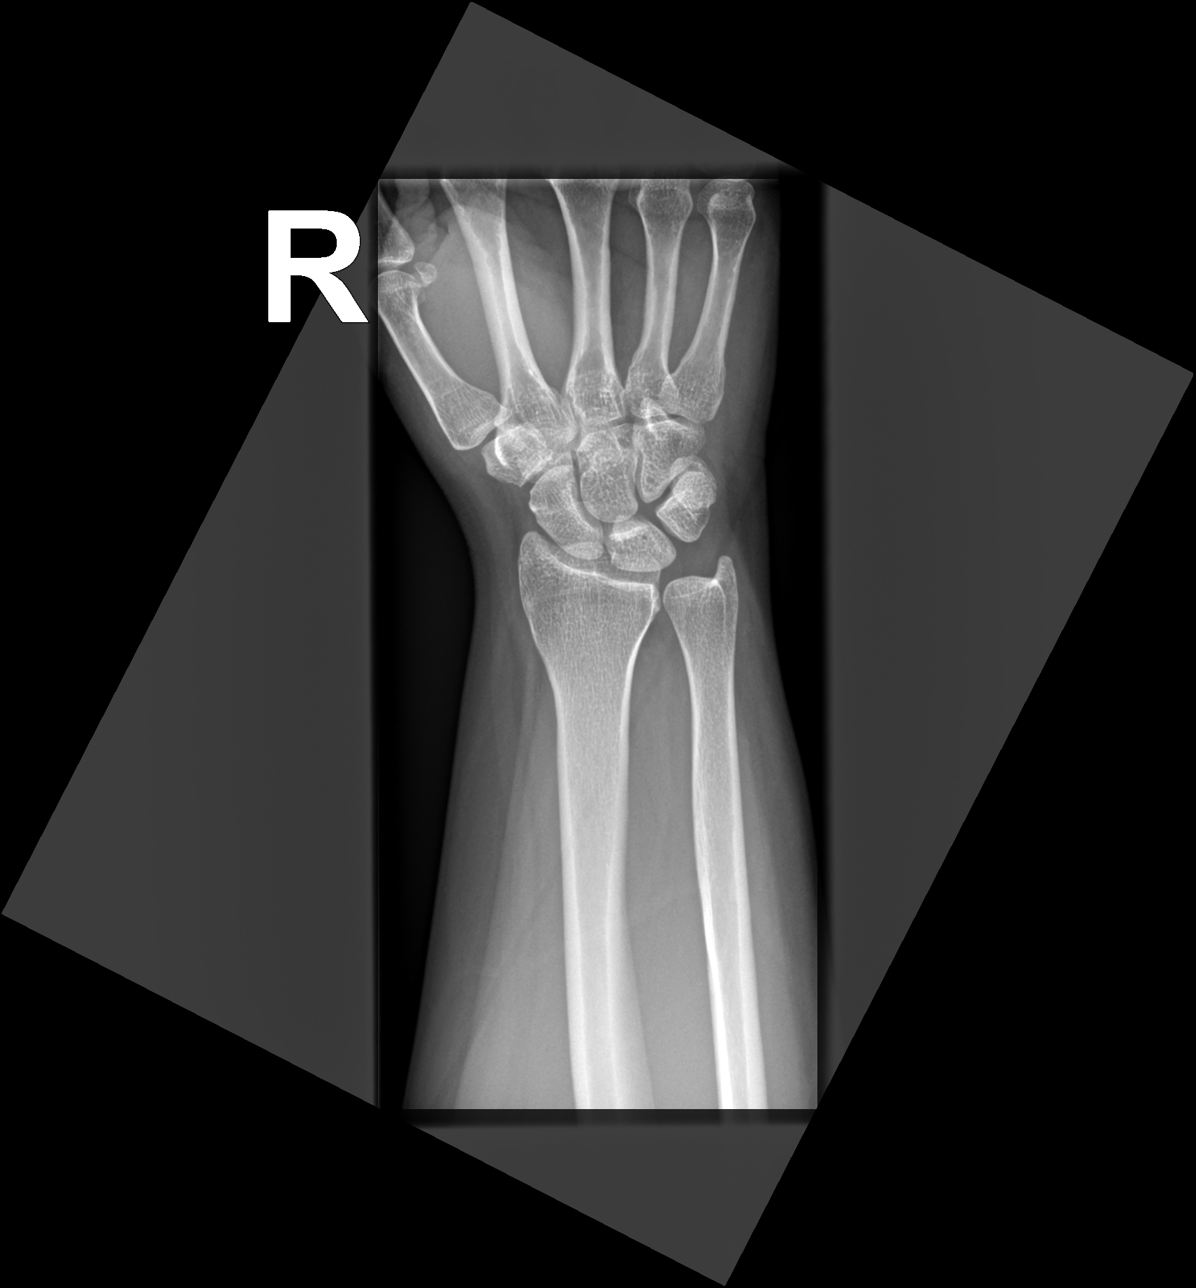

[wrist oblique]
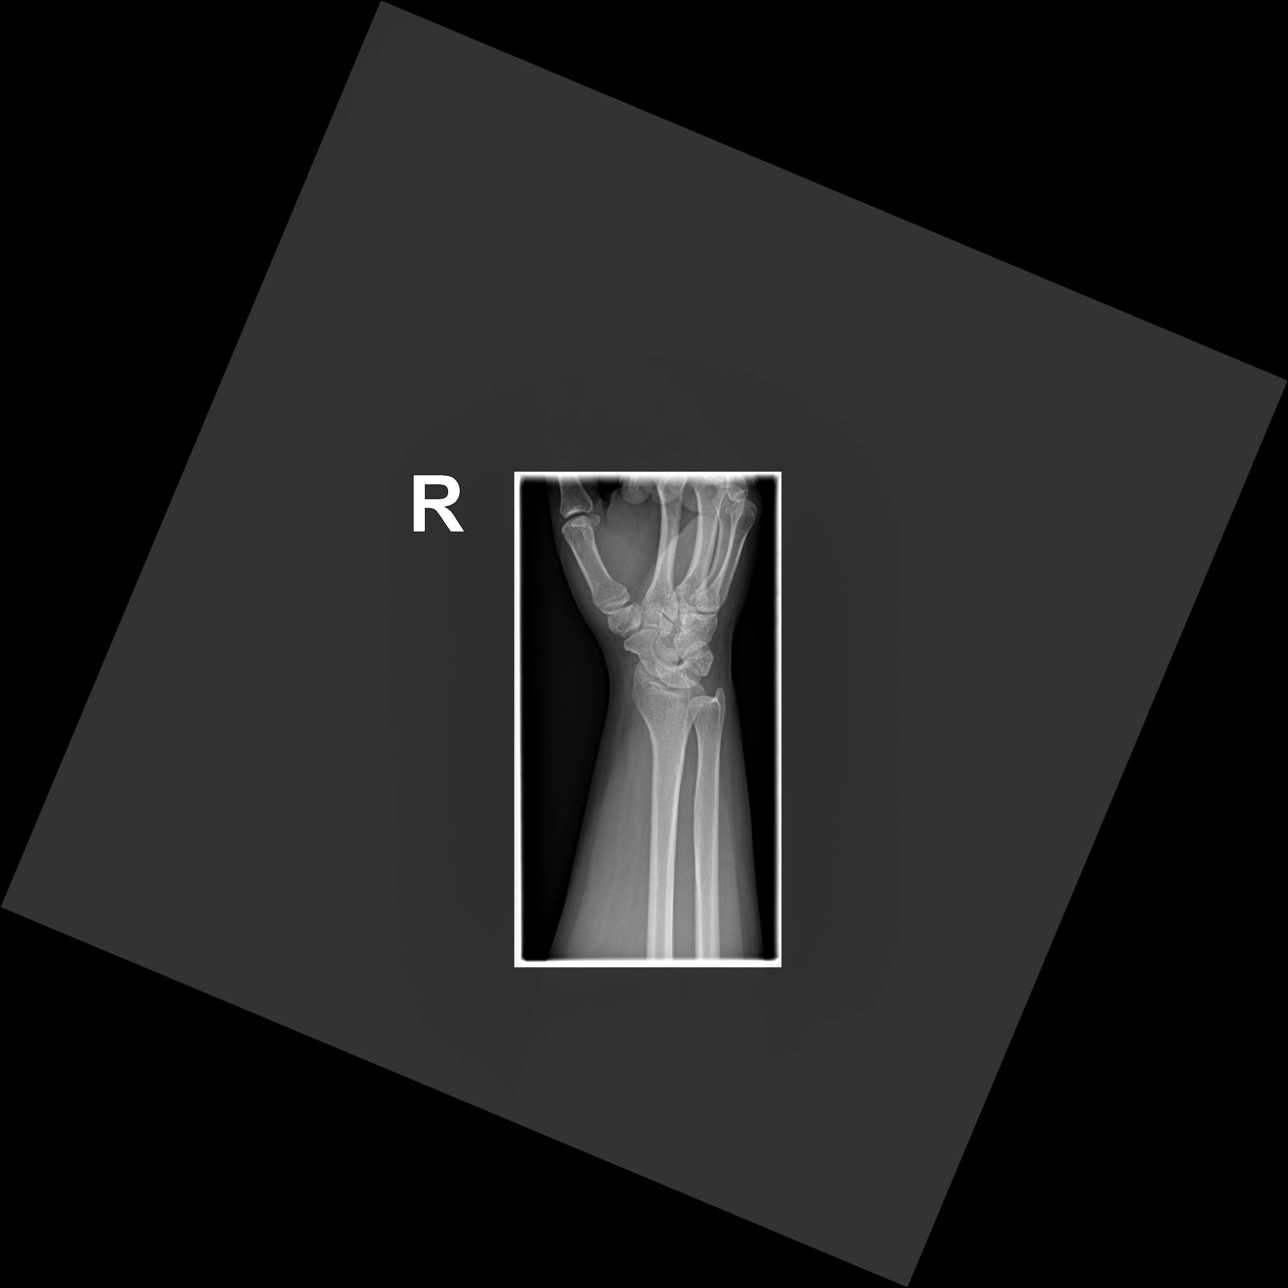

[wrist lat]
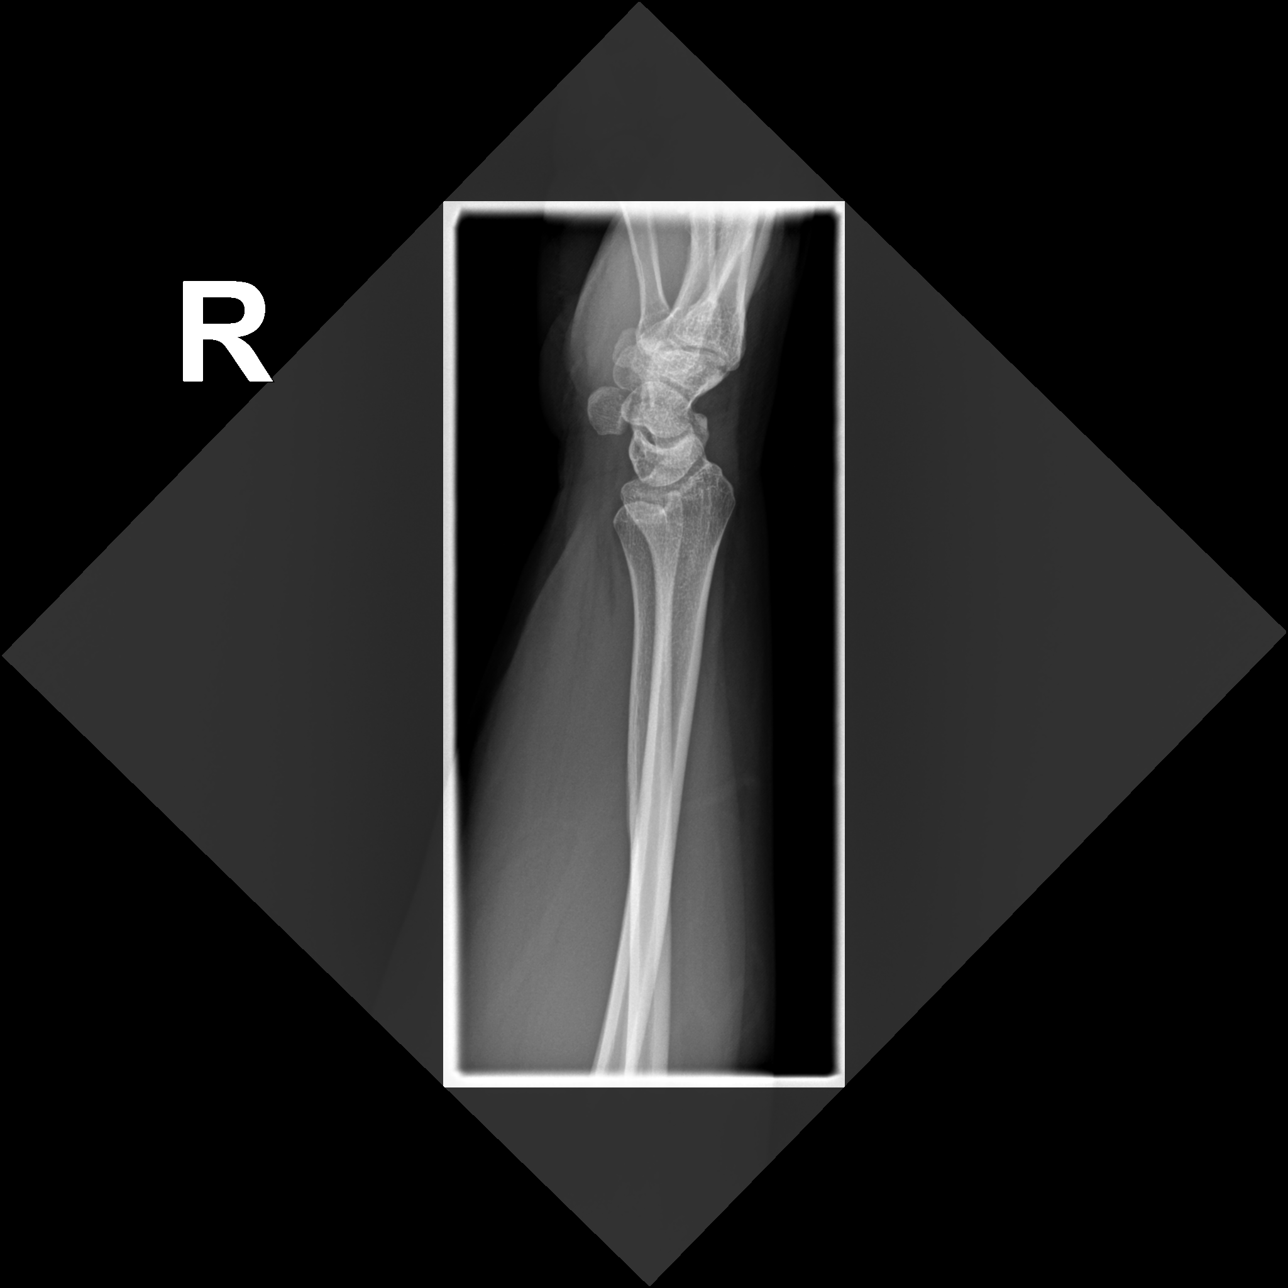

[3 of 3 positions shown; findings below may reference images not displayed]

FINDINGS: Frontal, oblique, and lateral views were obtained. There is no
fracture or dislocation. Joint spaces appear normal. No erosive
change or intra-articular calcification.
IMPRESSION: No fracture or dislocation.  No appreciable arthropathy.

## 2019-08-04 DIAGNOSIS — J3081 Allergic rhinitis due to animal (cat) (dog) hair and dander: Secondary | ICD-10-CM | POA: Diagnosis not present

## 2019-08-04 DIAGNOSIS — J3089 Other allergic rhinitis: Secondary | ICD-10-CM | POA: Diagnosis not present

## 2019-08-04 DIAGNOSIS — J301 Allergic rhinitis due to pollen: Secondary | ICD-10-CM | POA: Diagnosis not present

## 2019-08-11 DIAGNOSIS — J3081 Allergic rhinitis due to animal (cat) (dog) hair and dander: Secondary | ICD-10-CM | POA: Diagnosis not present

## 2019-08-11 DIAGNOSIS — J301 Allergic rhinitis due to pollen: Secondary | ICD-10-CM | POA: Diagnosis not present

## 2019-08-11 DIAGNOSIS — J3089 Other allergic rhinitis: Secondary | ICD-10-CM | POA: Diagnosis not present

## 2019-08-18 DIAGNOSIS — J3089 Other allergic rhinitis: Secondary | ICD-10-CM | POA: Diagnosis not present

## 2019-08-18 DIAGNOSIS — J3081 Allergic rhinitis due to animal (cat) (dog) hair and dander: Secondary | ICD-10-CM | POA: Diagnosis not present

## 2019-08-18 DIAGNOSIS — J301 Allergic rhinitis due to pollen: Secondary | ICD-10-CM | POA: Diagnosis not present

## 2019-08-25 DIAGNOSIS — J3089 Other allergic rhinitis: Secondary | ICD-10-CM | POA: Diagnosis not present

## 2019-08-25 DIAGNOSIS — J301 Allergic rhinitis due to pollen: Secondary | ICD-10-CM | POA: Diagnosis not present

## 2019-08-25 DIAGNOSIS — J3081 Allergic rhinitis due to animal (cat) (dog) hair and dander: Secondary | ICD-10-CM | POA: Diagnosis not present

## 2019-08-31 DIAGNOSIS — J3081 Allergic rhinitis due to animal (cat) (dog) hair and dander: Secondary | ICD-10-CM | POA: Diagnosis not present

## 2019-08-31 DIAGNOSIS — J301 Allergic rhinitis due to pollen: Secondary | ICD-10-CM | POA: Diagnosis not present

## 2019-09-01 DIAGNOSIS — J3089 Other allergic rhinitis: Secondary | ICD-10-CM | POA: Diagnosis not present

## 2019-09-01 DIAGNOSIS — L2089 Other atopic dermatitis: Secondary | ICD-10-CM | POA: Diagnosis not present

## 2019-09-01 DIAGNOSIS — J301 Allergic rhinitis due to pollen: Secondary | ICD-10-CM | POA: Diagnosis not present

## 2019-09-01 DIAGNOSIS — J3081 Allergic rhinitis due to animal (cat) (dog) hair and dander: Secondary | ICD-10-CM | POA: Diagnosis not present

## 2019-09-10 DIAGNOSIS — J3081 Allergic rhinitis due to animal (cat) (dog) hair and dander: Secondary | ICD-10-CM | POA: Diagnosis not present

## 2019-09-10 DIAGNOSIS — J301 Allergic rhinitis due to pollen: Secondary | ICD-10-CM | POA: Diagnosis not present

## 2019-09-10 DIAGNOSIS — J3089 Other allergic rhinitis: Secondary | ICD-10-CM | POA: Diagnosis not present

## 2019-09-22 DIAGNOSIS — J3081 Allergic rhinitis due to animal (cat) (dog) hair and dander: Secondary | ICD-10-CM | POA: Diagnosis not present

## 2019-09-22 DIAGNOSIS — J3089 Other allergic rhinitis: Secondary | ICD-10-CM | POA: Diagnosis not present

## 2019-09-22 DIAGNOSIS — J301 Allergic rhinitis due to pollen: Secondary | ICD-10-CM | POA: Diagnosis not present

## 2019-09-24 DIAGNOSIS — J3089 Other allergic rhinitis: Secondary | ICD-10-CM | POA: Diagnosis not present

## 2019-09-24 DIAGNOSIS — J301 Allergic rhinitis due to pollen: Secondary | ICD-10-CM | POA: Diagnosis not present

## 2019-09-24 DIAGNOSIS — J3081 Allergic rhinitis due to animal (cat) (dog) hair and dander: Secondary | ICD-10-CM | POA: Diagnosis not present

## 2019-10-09 DIAGNOSIS — J3089 Other allergic rhinitis: Secondary | ICD-10-CM | POA: Diagnosis not present

## 2019-10-09 DIAGNOSIS — J301 Allergic rhinitis due to pollen: Secondary | ICD-10-CM | POA: Diagnosis not present

## 2019-10-09 DIAGNOSIS — J3081 Allergic rhinitis due to animal (cat) (dog) hair and dander: Secondary | ICD-10-CM | POA: Diagnosis not present

## 2019-10-11 NOTE — Progress Notes (Signed)
Female Physical   Impression and Recommendations:    1. Encounter for general adult medical examination without abnormal findings      1) Anticipatory Guidance: Discussed skin CA prevention and sunscreen when outside along with skin surveillance; eating a balanced and modest diet; physical activity at least 25 minutes per day or minimum of 150 min/ week moderate to intense activity. - Encouraged to continue her moderate-vigorous aerobic activity and toning.  2) Immunizations / Screenings / Labs:   All immunizations are up-to-date per recommendations or will be updated today if pt allows.    - Patient understands with dental and vision screens they will schedule independently.  - Fasting CBC, CMP, HgA1c, Lipid panel, TSH already done within past 12 mo  - Patient may return for fasting blood work (lab only appt.) in the near future if insurance requests updated blood work.  - Pt declined HIV screening.  3) Weight:  BMI meaning discussed with patient.  Discussed goal to improve diet habits to improve overall feelings of well being and objective health data. Improve nutrient density of diet through increasing intake of fruits and vegetables and decreasing saturated fats, white flour products and refined sugars. - Patient is motivated to improve dietary lifestyle and loose weight.   No orders of the defined types were placed in this encounter.   No orders of the defined types were placed in this encounter.    Return in about 1 year (around 10/11/2020) for CPE.   Reminded pt important of f-up preventative CPE in 1 year.    Gross side effects, risk and benefits, and alternatives of medications discussed with patient.  Patient is aware that all medications have potential side effects and we are unable to predict every side effect or drug-drug interaction that may occur.  Expresses verbal understanding and consents to current therapy plan and treatment regimen.   Please see orders placed  and AVS handed out to patient at the end of our visit for further patient instructions/ counseling done pertaining to today's office visit.  Subjective:     CPE HPI: Lenea Bywater is a 39 y.o. female who presents to [place] [facility] today for a yearly health maintenance exam.   Health Maintenance Summary  - Reviewed and updated, unless pt declines services.  Tobacco History Reviewed:  Y, never smoker Alcohol and/or drug use:    No concerns; no use Exercise Habits: Aerobic exercise including lifting weights 2hrs/day. Dental Home: Yes Eye exams: H/o of laser eye surgery, reports no problems Dermatology home: Yes, has yearly skin cancer screenings  Female Health:  PAP Smear - last known results: Requesting from [facility]- Dr. Harrington Challenger  STD concerns:   none Birth control method:  Managed by Ob-Gyn Lumps or breast concerns:  none Breast Cancer Family History:  No   Additional concerns beyond health maintenance issues:  None   Immunization History  Administered Date(s) Administered  . Influenza,inj,Quad PF,6+ Mos 07/15/2015  . Tdap 08/04/2014     Health Maintenance  Topic Date Due  . COVID-19 Vaccine (1) Never done  . PAP SMEAR-Modifier  05/18/2019  . INFLUENZA VACCINE  01/17/2020  . TETANUS/TDAP  08/04/2024  . HIV Screening  Completed     Wt Readings from Last 3 Encounters:  10/12/19 180 lb 14.4 oz (82.1 kg)  12/25/18 184 lb 6.4 oz (83.6 kg)  08/01/17 183 lb (83 kg)   BP Readings from Last 3 Encounters:  10/12/19 108/74  12/25/18 123/86  08/01/17 119/81   Pulse Readings from Last 3 Encounters:  10/12/19 77  12/25/18 81  08/01/17 86     Past Medical History:  Diagnosis Date  . Acute superficial venous thrombosis of right lower extremity   . Asthma   . Environmental allergies   . History of chicken pox   . Seasonal allergies   . UTI (lower urinary tract infection)       Past Surgical History:  Procedure Laterality  Date  . NO PAST SURGERIES        Family History  Problem Relation Age of Onset  . Hyperlipidemia Father        Living  . Hypertension Father   . Diabetes Mother        Living  . Hypertension Mother   . Allergy (severe) Mother        Alpha-gal  . Other Mother        IVP Dye  . Heart disease Maternal Grandfather   . COPD Paternal Grandfather   . Healthy Sister        x1  . Allergies Daughter        x1  . Healthy Daughter        x2  . Diabetes Maternal Aunt   . Hyperlipidemia Maternal Aunt   . Hypertension Maternal Aunt   . Alcohol abuse Maternal Uncle   . Heart attack Maternal Uncle   . Hyperlipidemia Maternal Uncle   . Hypertension Maternal Uncle       Social History   Substance and Sexual Activity  Drug Use No  ,   Social History   Substance and Sexual Activity  Alcohol Use Yes  . Alcohol/week: 1.0 standard drinks  . Types: 1 Standard drinks or equivalent per week  ,   Social History   Tobacco Use  Smoking Status Never Smoker  Smokeless Tobacco Never Used  ,   Social History   Substance and Sexual Activity  Sexual Activity Yes  . Partners: Male  . Birth control/protection: Surgical    Current Outpatient Medications on File Prior to Visit  Medication Sig Dispense Refill  . albuterol (PROVENTIL HFA;VENTOLIN HFA) 108 (90 BASE) MCG/ACT inhaler Inhale 1-2 puffs into the lungs every 6 (six) hours as needed for wheezing or shortness of breath.    . CRYSELLE-28 0.3-30 MG-MCG tablet Take 1 tablet by mouth daily.    Marland Kitchen EPINEPHrine 0.3 mg/0.3 mL IJ SOAJ injection Inject into the muscle once. Reported on 07/15/2015    . montelukast (SINGULAIR) 10 MG tablet Take 10 mg by mouth daily as needed.    . triamcinolone ointment (KENALOG) 0.1 % Apply 1 application topically as needed.  3   No current facility-administered medications on file prior to visit.    Allergies: Gelatin and Other  Review of Systems: General: Denies fever, chills, unexplained weight  loss.  Optho/Auditory: Denies visual changes, blurred vision/LOV Respiratory: Denies SOB, DOE more than baseline levels.   Cardiovascular: Denies chest pain, palpitations, new onset peripheral edema  Gastrointestinal: Denies nausea, vomiting, diarrhea.  Genitourinary: Denies dysuria, freq/ urgency, flank pain or discharge from genitals.  Endocrine:  Denies hot or cold intolerance, polyuria, polydipsia. Musculoskeletal: Denies unexplained myalgias, joint swelling, unexplained arthralgias, gait problems.  Skin: Denies rash, suspicious lesions Neurological: Denies dizziness, unexplained weakness, numbness  Psychiatric/Behavioral: Denies mood changes, suicidal or homicidal ideations, hallucinations    Objective:    Blood pressure 108/74, pulse 77, temperature 97.9 F (36.6 C), temperature source Oral, height 5\' 4"  (  1.626 m), weight 180 lb 14.4 oz (82.1 kg), SpO2 99 %. Body mass index is 31.05 kg/m. General Appearance:    Alert, cooperative, no distress, appears stated age  Head:    Normocephalic, without obvious abnormality, atraumatic  Eyes:    PERRL, conjunctiva/corneas clear, EOM's intact, fundi    benign, both eyes  Ears:    Minimal effusion noted on both TM's, normal external ear   canals, both ears  Nose:   Nares normal, septum midline, mucosa normal, no drainage    or sinus tenderness  Throat:   Lips w/o lesion, mucosa moist, and tongue normal; teeth and   gums normal  Neck:   Supple, symmetrical, trachea midline, no adenopathy;    thyroid:  no enlargement/tenderness/nodules; no carotid   bruit or JVD  Back:     Symmetric, no curvature, ROM normal, no CVA tenderness  Lungs:     Clear to auscultation bilaterally, respirations unlabored, no       Wh/ R/ R  Chest Wall:    No tenderness or gross deformity; normal excursion   Heart:    Regular rate and rhythm, S1 and S2 normal, no murmur, rub   or gallop  Breast Exam:    Deferred to Ob-Gyn.  Abdomen:     Soft, non-tender, bowel  sounds active all four quadrants, NO   G/R/R, no masses, no organomegaly  Genitalia:    Deferred to Ob-Gyn.     Extremities:   Extremities normal, atraumatic, no cyanosis or gross edema  Pulses:   2+ and symmetric all extremities  Skin:   Warm, dry, Skin color, texture, turgor normal, no obvious rashes or lesions Psych: No HI/SI, judgement and insight good, Euthymic mood. Full Affect.  Neurologic:   CNII-XII intact, normal strength, sensation and reflexes    Throughout

## 2019-10-12 ENCOUNTER — Other Ambulatory Visit: Payer: Self-pay

## 2019-10-12 ENCOUNTER — Ambulatory Visit (INDEPENDENT_AMBULATORY_CARE_PROVIDER_SITE_OTHER): Payer: BC Managed Care – PPO | Admitting: Physician Assistant

## 2019-10-12 ENCOUNTER — Encounter: Payer: Self-pay | Admitting: Physician Assistant

## 2019-10-12 VITALS — BP 108/74 | HR 77 | Temp 97.9°F | Ht 64.0 in | Wt 180.9 lb

## 2019-10-12 DIAGNOSIS — Z0000 Encounter for general adult medical examination without abnormal findings: Secondary | ICD-10-CM | POA: Diagnosis not present

## 2019-10-12 NOTE — Patient Instructions (Signed)
Preventive Care for Adults, Female  A healthy lifestyle and preventive care can promote health and wellness. Preventive health guidelines for women include the following key practices.   A routine yearly physical is a good way to check with your health care provider about your health and preventive screening. It is a chance to share any concerns and updates on your health and to receive a thorough exam.   Visit your dentist for a routine exam and preventive care every 6 months. Brush your teeth twice a day and floss once a day. Good oral hygiene prevents tooth decay and gum disease.   The frequency of eye exams is based on your age, health, family medical history, use of contact lenses, and other factors. Follow your health care provider's recommendations for frequency of eye exams.   Eat a healthy diet. Foods like vegetables, fruits, whole grains, low-fat dairy products, and lean protein foods contain the nutrients you need without too many calories. Decrease your intake of foods high in solid fats, added sugars, and salt. Eat the right amount of calories for you.Get information about a proper diet from your health care provider, if necessary.   Regular physical exercise is one of the most important things you can do for your health. Most adults should get at least 150 minutes of moderate-intensity exercise (any activity that increases your heart rate and causes you to sweat) each week. In addition, most adults need muscle-strengthening exercises on 2 or more days a week.   Maintain a healthy weight. The body mass index (BMI) is a screening tool to identify possible weight problems. It provides an estimate of body fat based on height and weight. Your health care provider can find your BMI, and can help you achieve or maintain a healthy weight.For adults 20 years and older:   - A BMI below 18.5 is considered underweight.   - A BMI of 18.5 to 24.9 is normal.   - A BMI of 25 to 29.9 is  considered overweight.   - A BMI of 30 and above is considered obese.   Maintain normal blood lipids and cholesterol levels by exercising and minimizing your intake of trans and saturated fats.  Eat a balanced diet with plenty of fruit and vegetables. Blood tests for lipids and cholesterol should begin at age 20 and be repeated every 5 years minimum.  If your lipid or cholesterol levels are high, you are over 40, or you are at high risk for heart disease, you may need your cholesterol levels checked more frequently.Ongoing high lipid and cholesterol levels should be treated with medicines if diet and exercise are not working.   If you smoke, find out from your health care provider how to quit. If you do not use tobacco, do not start.   Lung cancer screening is recommended for adults aged 55-80 years who are at high risk for developing lung cancer because of a history of smoking. A yearly low-dose CT scan of the lungs is recommended for people who have at least a 30-pack-year history of smoking and are a current smoker or have quit within the past 15 years. A pack year of smoking is smoking an average of 1 pack of cigarettes a day for 1 year (for example: 1 pack a day for 30 years or 2 packs a day for 15 years). Yearly screening should continue until the smoker has stopped smoking for at least 15 years. Yearly screening should be stopped for people who develop a   health problem that would prevent them from having lung cancer treatment.   If you are pregnant, do not drink alcohol. If you are breastfeeding, be very cautious about drinking alcohol. If you are not pregnant and choose to drink alcohol, do not have more than 1 drink per day. One drink is considered to be 12 ounces (355 mL) of beer, 5 ounces (148 mL) of wine, or 1.5 ounces (44 mL) of liquor.   Avoid use of street drugs. Do not share needles with anyone. Ask for help if you need support or instructions about stopping the use of  drugs.   High blood pressure causes heart disease and increases the risk of stroke. Your blood pressure should be checked at least yearly.  Ongoing high blood pressure should be treated with medicines if weight loss and exercise do not work.   If you are 69-55 years old, ask your health care provider if you should take aspirin to prevent strokes.   Diabetes screening involves taking a blood sample to check your fasting blood sugar level. This should be done once every 3 years, after age 38, if you are within normal weight and without risk factors for diabetes. Testing should be considered at a younger age or be carried out more frequently if you are overweight and have at least 1 risk factor for diabetes.   Breast cancer screening is essential preventive care for women. You should practice "breast self-awareness."  This means understanding the normal appearance and feel of your breasts and may include breast self-examination.  Any changes detected, no matter how small, should be reported to a health care provider.  Women in their 80s and 30s should have a clinical breast exam (CBE) by a health care provider as part of a regular health exam every 1 to 3 years.  After age 66, women should have a CBE every year.  Starting at age 1, women should consider having a mammogram (breast X-ray test) every year.  Women who have a family history of breast cancer should talk to their health care provider about genetic screening.  Women at a high risk of breast cancer should talk to their health care providers about having an MRI and a mammogram every year.   -Breast cancer gene (BRCA)-related cancer risk assessment is recommended for women who have family members with BRCA-related cancers. BRCA-related cancers include breast, ovarian, tubal, and peritoneal cancers. Having family members with these cancers may be associated with an increased risk for harmful changes (mutations) in the breast cancer genes BRCA1 and  BRCA2. Results of the assessment will determine the need for genetic counseling and BRCA1 and BRCA2 testing.   The Pap test is a screening test for cervical cancer. A Pap test can show cell changes on the cervix that might become cervical cancer if left untreated. A Pap test is a procedure in which cells are obtained and examined from the lower end of the uterus (cervix).   - Women should have a Pap test starting at age 57.   - Between ages 90 and 70, Pap tests should be repeated every 2 years.   - Beginning at age 63, you should have a Pap test every 3 years as long as the past 3 Pap tests have been normal.   - Some women have medical problems that increase the chance of getting cervical cancer. Talk to your health care provider about these problems. It is especially important to talk to your health care provider if a  new problem develops soon after your last Pap test. In these cases, your health care provider may recommend more frequent screening and Pap tests.   - The above recommendations are the same for women who have or have not gotten the vaccine for human papillomavirus (HPV).   - If you had a hysterectomy for a problem that was not cancer or a condition that could lead to cancer, then you no longer need Pap tests. Even if you no longer need a Pap test, a regular exam is a good idea to make sure no other problems are starting.   - If you are between ages 36 and 66 years, and you have had normal Pap tests going back 10 years, you no longer need Pap tests. Even if you no longer need a Pap test, a regular exam is a good idea to make sure no other problems are starting.   - If you have had past treatment for cervical cancer or a condition that could lead to cancer, you need Pap tests and screening for cancer for at least 20 years after your treatment.   - If Pap tests have been discontinued, risk factors (such as a new sexual partner) need to be reassessed to determine if screening should  be resumed.   - The HPV test is an additional test that may be used for cervical cancer screening. The HPV test looks for the virus that can cause the cell changes on the cervix. The cells collected during the Pap test can be tested for HPV. The HPV test could be used to screen women aged 70 years and older, and should be used in women of any age who have unclear Pap test results. After the age of 67, women should have HPV testing at the same frequency as a Pap test.   Colorectal cancer can be detected and often prevented. Most routine colorectal cancer screening begins at the age of 57 years and continues through age 26 years. However, your health care provider may recommend screening at an earlier age if you have risk factors for colon cancer. On a yearly basis, your health care provider may provide home test kits to check for hidden blood in the stool.  Use of a small camera at the end of a tube, to directly examine the colon (sigmoidoscopy or colonoscopy), can detect the earliest forms of colorectal cancer. Talk to your health care provider about this at age 23, when routine screening begins. Direct exam of the colon should be repeated every 5 -10 years through age 49 years, unless early forms of pre-cancerous polyps or small growths are found.   People who are at an increased risk for hepatitis B should be screened for this virus. You are considered at high risk for hepatitis B if:  -You were born in a country where hepatitis B occurs often. Talk with your health care provider about which countries are considered high risk.  - Your parents were born in a high-risk country and you have not received a shot to protect against hepatitis B (hepatitis B vaccine).  - You have HIV or AIDS.  - You use needles to inject street drugs.  - You live with, or have sex with, someone who has Hepatitis B.  - You get hemodialysis treatment.  - You take certain medicines for conditions like cancer, organ  transplantation, and autoimmune conditions.   Hepatitis C blood testing is recommended for all people born from [birth year] through 1965 and any individual  with known risks for hepatitis C.   Practice safe sex. Use condoms and avoid high-risk sexual practices to reduce the spread of sexually transmitted infections (STIs). STIs include gonorrhea, chlamydia, syphilis, trichomonas, herpes, HPV, and human immunodeficiency virus (HIV). Herpes, HIV, and HPV are viral illnesses that have no cure. They can result in disability, cancer, and death. Sexually active women aged 25 years and younger should be checked for chlamydia. Older women with new or multiple partners should also be tested for chlamydia. Testing for other STIs is recommended if you are sexually active and at increased risk.   Osteoporosis is a disease in which the bones lose minerals and strength with aging. This can result in serious bone fractures or breaks. The risk of osteoporosis can be identified using a bone density scan. Women ages 65 years and over and women at risk for fractures or osteoporosis should discuss screening with their health care providers. Ask your health care provider whether you should take a calcium supplement or vitamin D to There are also several preventive steps women can take to avoid osteoporosis and resulting fractures or to keep osteoporosis from worsening. -->Recommendations include:  Eat a balanced diet high in fruits, vegetables, calcium, and vitamins.  Get enough calcium. The recommended total intake of is 1,200 mg daily; for best absorption, if taking supplements, divide doses into 250-500 mg doses throughout the day. Of the two types of calcium, calcium carbonate is best absorbed when taken with food but calcium citrate can be taken on an empty stomach.  Get enough vitamin D. NAMS and the National Osteoporosis Foundation recommend at least 1,000 IU per day for women age 50 and over who are at risk of vitamin D  deficiency. Vitamin D deficiency can be caused by inadequate sun exposure (for example, those who live in northern latitudes).  Avoid alcohol and smoking. Heavy alcohol intake (more than 7 drinks per week) increases the risk of falls and hip fracture and women smokers tend to lose bone more rapidly and have lower bone mass than nonsmokers. Stopping smoking is one of the most important changes women can make to improve their health and decrease risk for disease.  Be physically active every day. Weight-bearing exercise (for example, fast walking, hiking, jogging, and weight training) may strengthen bones or slow the rate of bone loss that comes with aging. Balancing and muscle-strengthening exercises can reduce the risk of falling and fracture.  Consider therapeutic medications. Currently, several types of effective drugs are available. Healthcare providers can recommend the type most appropriate for each woman.  Eliminate environmental factors that may contribute to accidents. Falls cause nearly 90% of all osteoporotic fractures, so reducing this risk is an important bone-health strategy. Measures include ample lighting, removing obstructions to walking, using nonskid rugs on floors, and placing mats and/or grab bars in showers.  Be aware of medication side effects. Some common medicines make bones weaker. These include a type of steroid drug called glucocorticoids used for arthritis and asthma, some antiseizure drugs, certain sleeping pills, treatments for endometriosis, and some cancer drugs. An overactive thyroid gland or using too much thyroid hormone for an underactive thyroid can also be a problem. If you are taking these medicines, talk to your doctor about what you can do to help protect your bones.reduce the rate of osteoporosis.    Menopause can be associated with physical symptoms and risks. Hormone replacement therapy is available to decrease symptoms and risks. You should talk to your  health care provider   about whether hormone replacement therapy is right for you.   Use sunscreen. Apply sunscreen liberally and repeatedly throughout the day. You should seek shade when your shadow is shorter than you. Protect yourself by wearing long sleeves, pants, a wide-brimmed hat, and sunglasses year round, whenever you are outdoors.   Once a month, do a whole body skin exam, using a mirror to look at the skin on your back. Tell your health care provider of new moles, moles that have irregular borders, moles that are larger than a pencil eraser, or moles that have changed in shape or color.   -Stay current with required vaccines (immunizations).   Influenza vaccine. All adults should be immunized every year.  Tetanus, diphtheria, and acellular pertussis (Td, Tdap) vaccine. Pregnant women should receive 1 dose of Tdap vaccine during each pregnancy. The dose should be obtained regardless of the length of time since the last dose. Immunization is preferred during the 27th 36th week of gestation. An adult who has not previously received Tdap or who does not know her vaccine status should receive 1 dose of Tdap. This initial dose should be followed by tetanus and diphtheria toxoids (Td) booster doses every 10 years. Adults with an unknown or incomplete history of completing a 3-dose immunization series with Td-containing vaccines should begin or complete a primary immunization series including a Tdap dose. Adults should receive a Td booster every 10 years.  Varicella vaccine. An adult without evidence of immunity to varicella should receive 2 doses or a second dose if she has previously received 1 dose. Pregnant females who do not have evidence of immunity should receive the first dose after pregnancy. This first dose should be obtained before leaving the health care facility. The second dose should be obtained 4 8 weeks after the first dose.  Human papillomavirus (HPV) vaccine. Females aged 13 26  years who have not received the vaccine previously should obtain the 3-dose series. The vaccine is not recommended for use in pregnant females. However, pregnancy testing is not needed before receiving a dose. If a female is found to be pregnant after receiving a dose, no treatment is needed. In that case, the remaining doses should be delayed until after the pregnancy. Immunization is recommended for any person with an immunocompromised condition through the age of 26 years if she did not get any or all doses earlier. During the 3-dose series, the second dose should be obtained 4 8 weeks after the first dose. The third dose should be obtained 24 weeks after the first dose and 16 weeks after the second dose.  Zoster vaccine. One dose is recommended for adults aged 60 years or older unless certain conditions are present.  Measles, mumps, and rubella (MMR) vaccine. Adults born before 1957 generally are considered immune to measles and mumps. Adults born in 1957 or later should have 1 or more doses of MMR vaccine unless there is a contraindication to the vaccine or there is laboratory evidence of immunity to each of the three diseases. A routine second dose of MMR vaccine should be obtained at least 28 days after the first dose for students attending postsecondary schools, health care workers, or international travelers. People who received inactivated measles vaccine or an unknown type of measles vaccine during 1963 1967 should receive 2 doses of MMR vaccine. People who received inactivated mumps vaccine or an unknown type of mumps vaccine before 1979 and are at high risk for mumps infection should consider immunization with 2 doses of   MMR vaccine. For females of childbearing age, rubella immunity should be determined. If there is no evidence of immunity, females who are not pregnant should be vaccinated. If there is no evidence of immunity, females who are pregnant should delay immunization until after pregnancy.  Unvaccinated health care workers born before [birth year] who lack laboratory evidence of measles, mumps, or rubella immunity or laboratory confirmation of disease should consider measles and mumps immunization with 2 doses of MMR vaccine or rubella immunization with 1 dose of MMR vaccine.  Pneumococcal 13-valent conjugate (PCV13) vaccine. When indicated, a person who is uncertain of her immunization history and has no record of immunization should receive the PCV13 vaccine. An adult aged 54 years or older who has certain medical conditions and has not been previously immunized should receive 1 dose of PCV13 vaccine. This PCV13 should be followed with a dose of pneumococcal polysaccharide (PPSV23) vaccine. The PPSV23 vaccine dose should be obtained at least 8 weeks after the dose of PCV13 vaccine. An adult aged 58 years or older who has certain medical conditions and previously received 1 or more doses of PPSV23 vaccine should receive 1 dose of PCV13. The PCV13 vaccine dose should be obtained 1 or more years after the last PPSV23 vaccine dose.  Pneumococcal polysaccharide (PPSV23) vaccine. When PCV13 is also indicated, PCV13 should be obtained first. All adults aged 58 years and older should be immunized. An adult younger than age 65 years who has certain medical conditions should be immunized. Any person who resides in a nursing home or long-term care facility should be immunized. An adult smoker should be immunized. People with an immunocompromised condition and certain other conditions should receive both PCV13 and PPSV23 vaccines. People with human immunodeficiency virus (HIV) infection should be immunized as soon as possible after diagnosis. Immunization during chemotherapy or radiation therapy should be avoided. Routine use of PPSV23 vaccine is not recommended for American Indians, [place] Natives, or people younger than 65 years unless there are medical conditions that require PPSV23 vaccine. When indicated,  people who have unknown immunization and have no record of immunization should receive PPSV23 vaccine. One-time revaccination 5 years after the first dose of PPSV23 is recommended for people aged 70 64 years who have chronic kidney failure, nephrotic syndrome, asplenia, or immunocompromised conditions. People who received 1 2 doses of PPSV23 before age 32 years should receive another dose of PPSV23 vaccine at age 96 years or later if at least 5 years have passed since the previous dose. Doses of PPSV23 are not needed for people immunized with PPSV23 at or after age 55 years.  Meningococcal vaccine. Adults with asplenia or persistent complement component deficiencies should receive 2 doses of quadrivalent meningococcal conjugate (MenACWY-D) vaccine. The doses should be obtained at least 2 months apart. Microbiologists working with certain meningococcal bacteria, [employer] recruits, people at risk during an outbreak, and people who travel to or live in countries with a high rate of meningitis should be immunized. A first-year college student up through age 58 years who is living in a residence hall should receive a dose if she did not receive a dose on or after her 16th birthday. Adults who have certain high-risk conditions should receive one or more doses of vaccine.  Hepatitis A vaccine. Adults who wish to be protected from this disease, have certain high-risk conditions, work with hepatitis A-infected animals, work in hepatitis A research labs, or travel to or work in countries with a high rate of hepatitis A should be  immunized. Adults who were previously unvaccinated and who anticipate close contact with an international adoptee during the first 60 days after arrival in the [place] from a country with a high rate of hepatitis A should be immunized.  Hepatitis B vaccine.  Adults who wish to be protected from this disease, have certain high-risk conditions, may be exposed to blood or other infectious  body fluids, are household contacts or sex partners of hepatitis B positive people, are clients or workers in certain care facilities, or travel to or work in countries with a high rate of hepatitis B should be immunized.  Haemophilus influenzae type b (Hib) vaccine. A previously unvaccinated person with asplenia or sickle cell disease or having a scheduled splenectomy should receive 1 dose of Hib vaccine. Regardless of previous immunization, a recipient of a hematopoietic stem cell transplant should receive a 3-dose series 6 12 months after her successful transplant. Hib vaccine is not recommended for adults with HIV infection.  Preventive Services / Frequency Ages 6 to 39years  Blood pressure check.** / Every 1 to 2 years.  Lipid and cholesterol check.** / Every 5 years beginning at age 39.  Clinical breast exam.** / Every 3 years for women in their 61s and 62s.  BRCA-related cancer risk assessment.** / For women who have family members with a BRCA-related cancer (breast, ovarian, tubal, or peritoneal cancers).  Pap test.** / Every 2 years from ages 47 through 85. Every 3 years starting at age 34 through age 12 or 74 with a history of 3 consecutive normal Pap tests.  HPV screening.** / Every 3 years from ages 46 through ages 43 to 54 with a history of 3 consecutive normal Pap tests.  Hepatitis C blood test.** / For any individual with known risks for hepatitis C.  Skin self-exam. / Monthly.  Influenza vaccine. / Every year.  Tetanus, diphtheria, and acellular pertussis (Tdap, Td) vaccine.** / Consult your health care provider. Pregnant women should receive 1 dose of Tdap vaccine during each pregnancy. 1 dose of Td every 10 years.  Varicella vaccine.** / Consult your health care provider. Pregnant females who do not have evidence of immunity should receive the first dose after pregnancy.  HPV vaccine. / 3 doses over 6 months, if 64 and younger. The vaccine is not recommended for use in  pregnant females. However, pregnancy testing is not needed before receiving a dose.  Measles, mumps, rubella (MMR) vaccine.** / You need at least 1 dose of MMR if you were born in 1957 or later. You may also need a 2nd dose. For females of childbearing age, rubella immunity should be determined. If there is no evidence of immunity, females who are not pregnant should be vaccinated. If there is no evidence of immunity, females who are pregnant should delay immunization until after pregnancy.  Pneumococcal 13-valent conjugate (PCV13) vaccine.** / Consult your health care provider.  Pneumococcal polysaccharide (PPSV23) vaccine.** / 1 to 2 doses if you smoke cigarettes or if you have certain conditions.  Meningococcal vaccine.** / 1 dose if you are age 71 to 37 years and a Market researcher living in a residence hall, or have one of several medical conditions, you need to get vaccinated against meningococcal disease. You may also need additional booster doses.  Hepatitis A vaccine.** / Consult your health care provider.  Hepatitis B vaccine.** / Consult your health care provider.  Haemophilus influenzae type b (Hib) vaccine.** / Consult your health care provider.  Ages 55 to 64years  Blood pressure check.** / Every 1 to 2 years.  Lipid and cholesterol check.** / Every 5 years beginning at age 20 years.  Lung cancer screening. / Every year if you are aged 55 80 years and have a 30-pack-year history of smoking and currently smoke or have quit within the past 15 years. Yearly screening is stopped once you have quit smoking for at least 15 years or develop a health problem that would prevent you from having lung cancer treatment.  Clinical breast exam.** / Every year after age 40 years.  BRCA-related cancer risk assessment.** / For women who have family members with a BRCA-related cancer (breast, ovarian, tubal, or peritoneal cancers).  Mammogram.** / Every year beginning at age 40  years and continuing for as long as you are in good health. Consult with your health care provider.  Pap test.** / Every 3 years starting at age 30 years through age 65 or 70 years with a history of 3 consecutive normal Pap tests.  HPV screening.** / Every 3 years from ages 30 years through ages 65 to 70 years with a history of 3 consecutive normal Pap tests.  Fecal occult blood test (FOBT) of stool. / Every year beginning at age 50 years and continuing until age 75 years. You may not need to do this test if you get a colonoscopy every 10 years.  Flexible sigmoidoscopy or colonoscopy.** / Every 5 years for a flexible sigmoidoscopy or every 10 years for a colonoscopy beginning at age 50 years and continuing until age 75 years.  Hepatitis C blood test.** / For all people born from 1945 through 1965 and any individual with known risks for hepatitis C.  Skin self-exam. / Monthly.  Influenza vaccine. / Every year.  Tetanus, diphtheria, and acellular pertussis (Tdap/Td) vaccine.** / Consult your health care provider. Pregnant women should receive 1 dose of Tdap vaccine during each pregnancy. 1 dose of Td every 10 years.  Varicella vaccine.** / Consult your health care provider. Pregnant females who do not have evidence of immunity should receive the first dose after pregnancy.  Zoster vaccine.** / 1 dose for adults aged 60 years or older.  Measles, mumps, rubella (MMR) vaccine.** / You need at least 1 dose of MMR if you were born in 1957 or later. You may also need a 2nd dose. For females of childbearing age, rubella immunity should be determined. If there is no evidence of immunity, females who are not pregnant should be vaccinated. If there is no evidence of immunity, females who are pregnant should delay immunization until after pregnancy.  Pneumococcal 13-valent conjugate (PCV13) vaccine.** / Consult your health care provider.  Pneumococcal polysaccharide (PPSV23) vaccine.** / 1 to 2 doses if  you smoke cigarettes or if you have certain conditions.  Meningococcal vaccine.** / Consult your health care provider.  Hepatitis A vaccine.** / Consult your health care provider.  Hepatitis B vaccine.** / Consult your health care provider.  Haemophilus influenzae type b (Hib) vaccine.** / Consult your health care provider.  Ages 65 years and over  Blood pressure check.** / Every 1 to 2 years.  Lipid and cholesterol check.** / Every 5 years beginning at age 20 years.  Lung cancer screening. / Every year if you are aged 55 80 years and have a 30-pack-year history of smoking and currently smoke or have quit within the past 15 years. Yearly screening is stopped once you have quit smoking for at least 15 years or develop a health problem that   would prevent you from having lung cancer treatment.  Clinical breast exam.** / Every year after age 103 years.  BRCA-related cancer risk assessment.** / For women who have family members with a BRCA-related cancer (breast, ovarian, tubal, or peritoneal cancers).  Mammogram.** / Every year beginning at age 36 years and continuing for as long as you are in good health. Consult with your health care provider.  Pap test.** / Every 3 years starting at age 5 years through age 85 or 10 years with 3 consecutive normal Pap tests. Testing can be stopped between 65 and 70 years with 3 consecutive normal Pap tests and no abnormal Pap or HPV tests in the past 10 years.  HPV screening.** / Every 3 years from ages 93 years through ages 70 or 45 years with a history of 3 consecutive normal Pap tests. Testing can be stopped between 65 and 70 years with 3 consecutive normal Pap tests and no abnormal Pap or HPV tests in the past 10 years.  Fecal occult blood test (FOBT) of stool. / Every year beginning at age 8 years and continuing until age 45 years. You may not need to do this test if you get a colonoscopy every 10 years.  Flexible sigmoidoscopy or colonoscopy.** /  Every 5 years for a flexible sigmoidoscopy or every 10 years for a colonoscopy beginning at age 69 years and continuing until age 68 years.  Hepatitis C blood test.** / For all people born from [birth year] through 1965 and any individual with known risks for hepatitis C.  Osteoporosis screening.** / A one-time screening for women ages 7 years and over and women at risk for fractures or osteoporosis.  Skin self-exam. / Monthly.  Influenza vaccine. / Every year.  Tetanus, diphtheria, and acellular pertussis (Tdap/Td) vaccine.** / 1 dose of Td every 10 years.  Varicella vaccine.** / Consult your health care provider.  Zoster vaccine.** / 1 dose for adults aged 5 years or older.  Pneumococcal 13-valent conjugate (PCV13) vaccine.** / Consult your health care provider.  Pneumococcal polysaccharide (PPSV23) vaccine.** / 1 dose for all adults aged 74 years and older.  Meningococcal vaccine.** / Consult your health care provider.  Hepatitis A vaccine.** / Consult your health care provider.  Hepatitis B vaccine.** / Consult your health care provider.  Haemophilus influenzae type b (Hib) vaccine.** / Consult your health care provider. ** Family history and personal history of risk and conditions may change your health care provider's recommendations. Document Released: 07/31/2001 Document Revised: 03/25/2013  [facility] Patient Information 2014 [place], [place].   EXERCISE AND DIET:  We recommended that you start or continue a regular exercise program for good health. Regular exercise means any activity that makes your heart beat faster and makes you sweat.  We recommend exercising at least 30 minutes per day at least 3 days a week, preferably 5.  We also recommend a diet low in fat and sugar / carbohydrates.  Inactivity, poor dietary choices and obesity can cause diabetes, heart attack, stroke, and kidney damage, among others.     ALCOHOL AND SMOKING:  Women should limit their alcohol intake to no  more than 7 drinks/beers/glasses of wine (combined, not each!) per week. Moderation of alcohol intake to this level decreases your risk of breast cancer and liver damage.  ( And of course, no recreational drugs are part of a healthy lifestyle.)  Also, you should not be smoking at all or even being exposed to second hand smoke. Most people know smoking can  cause cancer, and various heart and lung diseases, but did you know it also contributes to weakening of your bones?  Aging of your skin?  Yellowing of your teeth and nails?   CALCIUM AND VITAMIN D:  Adequate intake of calcium and Vitamin D are recommended.  The recommendations for exact amounts of these supplements seem to change often, but generally speaking 600 mg of calcium (either carbonate or citrate) and 800 units of Vitamin D per day seems prudent. Certain women may benefit from higher intake of Vitamin D.  If you are among these women, your doctor will have told you during your visit.     PAP SMEARS:  Pap smears, to check for cervical cancer or precancers,  have traditionally been done yearly, although recent scientific advances have shown that most women can have pap smears less often.  However, every woman still should have a physical exam from her gynecologist or primary care physician every year. It will include a breast check, inspection of the vulva and vagina to check for abnormal growths or skin changes, a visual exam of the cervix, and then an exam to evaluate the size and shape of the uterus and ovaries.  And after 39 years of age, a rectal exam is indicated to check for rectal cancers. We will also provide age appropriate advice regarding health maintenance, like when you should have certain vaccines, screening for sexually transmitted diseases, bone density testing, colonoscopy, mammograms, etc.    MAMMOGRAMS:  All women over 71 years old should have a yearly mammogram. Many facilities now offer a "3D" mammogram, which may cost  around $50 extra out of pocket. If possible,  we recommend you accept the option to have the 3D mammogram performed.  It both reduces the number of women who will be called back for extra views which then turn out to be normal, and it is better than the routine mammogram at detecting truly abnormal areas.     COLONOSCOPY:  Colonoscopy to screen for colon cancer is recommended for all women at age 52.  We know, you hate the idea of the prep.  We agree, BUT, having colon cancer and not knowing it is worse!!  Colon cancer so often starts as a polyp that can be seen and removed at colonscopy, which can quite literally save your life!  And if your first colonoscopy is normal and you have no family history of colon cancer, most women don't have to have it again for 10 years.  Once every ten years, you can do something that may end up saving your life, right?  We will be happy to help you get it scheduled when you are ready.  Be sure to check your insurance coverage so you understand how much it will cost.  It may be covered as a preventative service at no cost, but you should check your particular policy.

## 2019-10-13 DIAGNOSIS — J3081 Allergic rhinitis due to animal (cat) (dog) hair and dander: Secondary | ICD-10-CM | POA: Diagnosis not present

## 2019-10-13 DIAGNOSIS — J3089 Other allergic rhinitis: Secondary | ICD-10-CM | POA: Diagnosis not present

## 2019-10-13 DIAGNOSIS — J301 Allergic rhinitis due to pollen: Secondary | ICD-10-CM | POA: Diagnosis not present

## 2019-10-14 ENCOUNTER — Encounter: Payer: Self-pay | Admitting: Physician Assistant

## 2019-10-20 DIAGNOSIS — J301 Allergic rhinitis due to pollen: Secondary | ICD-10-CM | POA: Diagnosis not present

## 2019-10-20 DIAGNOSIS — J3089 Other allergic rhinitis: Secondary | ICD-10-CM | POA: Diagnosis not present

## 2019-10-20 DIAGNOSIS — J3081 Allergic rhinitis due to animal (cat) (dog) hair and dander: Secondary | ICD-10-CM | POA: Diagnosis not present

## 2019-10-22 DIAGNOSIS — J3089 Other allergic rhinitis: Secondary | ICD-10-CM | POA: Diagnosis not present

## 2019-10-22 DIAGNOSIS — J3081 Allergic rhinitis due to animal (cat) (dog) hair and dander: Secondary | ICD-10-CM | POA: Diagnosis not present

## 2019-10-22 DIAGNOSIS — J301 Allergic rhinitis due to pollen: Secondary | ICD-10-CM | POA: Diagnosis not present

## 2019-11-05 DIAGNOSIS — J301 Allergic rhinitis due to pollen: Secondary | ICD-10-CM | POA: Diagnosis not present

## 2019-11-05 DIAGNOSIS — J3081 Allergic rhinitis due to animal (cat) (dog) hair and dander: Secondary | ICD-10-CM | POA: Diagnosis not present

## 2019-11-05 DIAGNOSIS — J3089 Other allergic rhinitis: Secondary | ICD-10-CM | POA: Diagnosis not present

## 2019-11-10 DIAGNOSIS — J3089 Other allergic rhinitis: Secondary | ICD-10-CM | POA: Diagnosis not present

## 2019-11-10 DIAGNOSIS — J301 Allergic rhinitis due to pollen: Secondary | ICD-10-CM | POA: Diagnosis not present

## 2019-11-10 DIAGNOSIS — J3081 Allergic rhinitis due to animal (cat) (dog) hair and dander: Secondary | ICD-10-CM | POA: Diagnosis not present

## 2019-11-19 DIAGNOSIS — J301 Allergic rhinitis due to pollen: Secondary | ICD-10-CM | POA: Diagnosis not present

## 2019-11-19 DIAGNOSIS — J3089 Other allergic rhinitis: Secondary | ICD-10-CM | POA: Diagnosis not present

## 2019-11-19 DIAGNOSIS — J3081 Allergic rhinitis due to animal (cat) (dog) hair and dander: Secondary | ICD-10-CM | POA: Diagnosis not present

## 2019-11-26 DIAGNOSIS — J301 Allergic rhinitis due to pollen: Secondary | ICD-10-CM | POA: Diagnosis not present

## 2019-11-26 DIAGNOSIS — J3089 Other allergic rhinitis: Secondary | ICD-10-CM | POA: Diagnosis not present

## 2019-11-26 DIAGNOSIS — J3081 Allergic rhinitis due to animal (cat) (dog) hair and dander: Secondary | ICD-10-CM | POA: Diagnosis not present

## 2019-12-03 DIAGNOSIS — J301 Allergic rhinitis due to pollen: Secondary | ICD-10-CM | POA: Diagnosis not present

## 2019-12-03 DIAGNOSIS — J3081 Allergic rhinitis due to animal (cat) (dog) hair and dander: Secondary | ICD-10-CM | POA: Diagnosis not present

## 2019-12-03 DIAGNOSIS — J3089 Other allergic rhinitis: Secondary | ICD-10-CM | POA: Diagnosis not present

## 2019-12-10 DIAGNOSIS — J301 Allergic rhinitis due to pollen: Secondary | ICD-10-CM | POA: Diagnosis not present

## 2019-12-10 DIAGNOSIS — J3081 Allergic rhinitis due to animal (cat) (dog) hair and dander: Secondary | ICD-10-CM | POA: Diagnosis not present

## 2019-12-10 DIAGNOSIS — J3089 Other allergic rhinitis: Secondary | ICD-10-CM | POA: Diagnosis not present

## 2019-12-17 DIAGNOSIS — J3081 Allergic rhinitis due to animal (cat) (dog) hair and dander: Secondary | ICD-10-CM | POA: Diagnosis not present

## 2019-12-17 DIAGNOSIS — J3089 Other allergic rhinitis: Secondary | ICD-10-CM | POA: Diagnosis not present

## 2019-12-17 DIAGNOSIS — J301 Allergic rhinitis due to pollen: Secondary | ICD-10-CM | POA: Diagnosis not present

## 2019-12-24 DIAGNOSIS — J3081 Allergic rhinitis due to animal (cat) (dog) hair and dander: Secondary | ICD-10-CM | POA: Diagnosis not present

## 2019-12-24 DIAGNOSIS — J301 Allergic rhinitis due to pollen: Secondary | ICD-10-CM | POA: Diagnosis not present

## 2019-12-24 DIAGNOSIS — J3089 Other allergic rhinitis: Secondary | ICD-10-CM | POA: Diagnosis not present

## 2019-12-29 DIAGNOSIS — J3081 Allergic rhinitis due to animal (cat) (dog) hair and dander: Secondary | ICD-10-CM | POA: Diagnosis not present

## 2019-12-29 DIAGNOSIS — J301 Allergic rhinitis due to pollen: Secondary | ICD-10-CM | POA: Diagnosis not present

## 2019-12-29 DIAGNOSIS — J3089 Other allergic rhinitis: Secondary | ICD-10-CM | POA: Diagnosis not present

## 2020-01-15 DIAGNOSIS — J3081 Allergic rhinitis due to animal (cat) (dog) hair and dander: Secondary | ICD-10-CM | POA: Diagnosis not present

## 2020-01-15 DIAGNOSIS — J3089 Other allergic rhinitis: Secondary | ICD-10-CM | POA: Diagnosis not present

## 2020-01-15 DIAGNOSIS — J301 Allergic rhinitis due to pollen: Secondary | ICD-10-CM | POA: Diagnosis not present

## 2020-01-19 DIAGNOSIS — J3081 Allergic rhinitis due to animal (cat) (dog) hair and dander: Secondary | ICD-10-CM | POA: Diagnosis not present

## 2020-01-19 DIAGNOSIS — J3089 Other allergic rhinitis: Secondary | ICD-10-CM | POA: Diagnosis not present

## 2020-01-19 DIAGNOSIS — J301 Allergic rhinitis due to pollen: Secondary | ICD-10-CM | POA: Diagnosis not present

## 2020-01-26 DIAGNOSIS — J3081 Allergic rhinitis due to animal (cat) (dog) hair and dander: Secondary | ICD-10-CM | POA: Diagnosis not present

## 2020-01-26 DIAGNOSIS — J301 Allergic rhinitis due to pollen: Secondary | ICD-10-CM | POA: Diagnosis not present

## 2020-01-26 DIAGNOSIS — J3089 Other allergic rhinitis: Secondary | ICD-10-CM | POA: Diagnosis not present

## 2020-02-02 DIAGNOSIS — J3089 Other allergic rhinitis: Secondary | ICD-10-CM | POA: Diagnosis not present

## 2020-02-02 DIAGNOSIS — J301 Allergic rhinitis due to pollen: Secondary | ICD-10-CM | POA: Diagnosis not present

## 2020-02-02 DIAGNOSIS — J3081 Allergic rhinitis due to animal (cat) (dog) hair and dander: Secondary | ICD-10-CM | POA: Diagnosis not present

## 2020-02-08 DIAGNOSIS — J301 Allergic rhinitis due to pollen: Secondary | ICD-10-CM | POA: Diagnosis not present

## 2020-02-08 DIAGNOSIS — J3081 Allergic rhinitis due to animal (cat) (dog) hair and dander: Secondary | ICD-10-CM | POA: Diagnosis not present

## 2020-02-09 DIAGNOSIS — J3089 Other allergic rhinitis: Secondary | ICD-10-CM | POA: Diagnosis not present

## 2020-02-09 DIAGNOSIS — J301 Allergic rhinitis due to pollen: Secondary | ICD-10-CM | POA: Diagnosis not present

## 2020-02-09 DIAGNOSIS — J3081 Allergic rhinitis due to animal (cat) (dog) hair and dander: Secondary | ICD-10-CM | POA: Diagnosis not present

## 2020-02-15 DIAGNOSIS — Z20828 Contact with and (suspected) exposure to other viral communicable diseases: Secondary | ICD-10-CM | POA: Diagnosis not present

## 2020-02-16 DIAGNOSIS — J3089 Other allergic rhinitis: Secondary | ICD-10-CM | POA: Diagnosis not present

## 2020-02-16 DIAGNOSIS — J3081 Allergic rhinitis due to animal (cat) (dog) hair and dander: Secondary | ICD-10-CM | POA: Diagnosis not present

## 2020-02-16 DIAGNOSIS — J301 Allergic rhinitis due to pollen: Secondary | ICD-10-CM | POA: Diagnosis not present

## 2020-02-26 DIAGNOSIS — Z23 Encounter for immunization: Secondary | ICD-10-CM | POA: Diagnosis not present

## 2020-03-01 DIAGNOSIS — J301 Allergic rhinitis due to pollen: Secondary | ICD-10-CM | POA: Diagnosis not present

## 2020-03-01 DIAGNOSIS — J3081 Allergic rhinitis due to animal (cat) (dog) hair and dander: Secondary | ICD-10-CM | POA: Diagnosis not present

## 2020-03-01 DIAGNOSIS — J3089 Other allergic rhinitis: Secondary | ICD-10-CM | POA: Diagnosis not present

## 2020-03-08 DIAGNOSIS — J3089 Other allergic rhinitis: Secondary | ICD-10-CM | POA: Diagnosis not present

## 2020-03-08 DIAGNOSIS — J3081 Allergic rhinitis due to animal (cat) (dog) hair and dander: Secondary | ICD-10-CM | POA: Diagnosis not present

## 2020-03-08 DIAGNOSIS — J301 Allergic rhinitis due to pollen: Secondary | ICD-10-CM | POA: Diagnosis not present

## 2020-03-15 DIAGNOSIS — J3081 Allergic rhinitis due to animal (cat) (dog) hair and dander: Secondary | ICD-10-CM | POA: Diagnosis not present

## 2020-03-15 DIAGNOSIS — J301 Allergic rhinitis due to pollen: Secondary | ICD-10-CM | POA: Diagnosis not present

## 2020-03-15 DIAGNOSIS — J3089 Other allergic rhinitis: Secondary | ICD-10-CM | POA: Diagnosis not present

## 2020-03-18 DIAGNOSIS — Z01419 Encounter for gynecological examination (general) (routine) without abnormal findings: Secondary | ICD-10-CM | POA: Diagnosis not present

## 2020-03-18 DIAGNOSIS — Z6830 Body mass index (BMI) 30.0-30.9, adult: Secondary | ICD-10-CM | POA: Diagnosis not present

## 2020-03-18 DIAGNOSIS — Z23 Encounter for immunization: Secondary | ICD-10-CM | POA: Diagnosis not present

## 2020-03-18 DIAGNOSIS — N841 Polyp of cervix uteri: Secondary | ICD-10-CM | POA: Diagnosis not present

## 2020-03-22 DIAGNOSIS — J301 Allergic rhinitis due to pollen: Secondary | ICD-10-CM | POA: Diagnosis not present

## 2020-03-22 DIAGNOSIS — J3081 Allergic rhinitis due to animal (cat) (dog) hair and dander: Secondary | ICD-10-CM | POA: Diagnosis not present

## 2020-03-22 DIAGNOSIS — J3089 Other allergic rhinitis: Secondary | ICD-10-CM | POA: Diagnosis not present

## 2020-04-06 DIAGNOSIS — J301 Allergic rhinitis due to pollen: Secondary | ICD-10-CM | POA: Diagnosis not present

## 2020-04-06 DIAGNOSIS — R9389 Abnormal findings on diagnostic imaging of other specified body structures: Secondary | ICD-10-CM | POA: Diagnosis not present

## 2020-04-06 DIAGNOSIS — Z6831 Body mass index (BMI) 31.0-31.9, adult: Secondary | ICD-10-CM | POA: Diagnosis not present

## 2020-04-06 DIAGNOSIS — N920 Excessive and frequent menstruation with regular cycle: Secondary | ICD-10-CM | POA: Diagnosis not present

## 2020-04-06 DIAGNOSIS — N939 Abnormal uterine and vaginal bleeding, unspecified: Secondary | ICD-10-CM | POA: Diagnosis not present

## 2020-04-06 DIAGNOSIS — J3081 Allergic rhinitis due to animal (cat) (dog) hair and dander: Secondary | ICD-10-CM | POA: Diagnosis not present

## 2020-04-06 DIAGNOSIS — J3089 Other allergic rhinitis: Secondary | ICD-10-CM | POA: Diagnosis not present

## 2020-04-13 DIAGNOSIS — J301 Allergic rhinitis due to pollen: Secondary | ICD-10-CM | POA: Diagnosis not present

## 2020-04-13 DIAGNOSIS — J3081 Allergic rhinitis due to animal (cat) (dog) hair and dander: Secondary | ICD-10-CM | POA: Diagnosis not present

## 2020-04-13 DIAGNOSIS — J3089 Other allergic rhinitis: Secondary | ICD-10-CM | POA: Diagnosis not present

## 2020-04-20 DIAGNOSIS — J3081 Allergic rhinitis due to animal (cat) (dog) hair and dander: Secondary | ICD-10-CM | POA: Diagnosis not present

## 2020-04-20 DIAGNOSIS — J301 Allergic rhinitis due to pollen: Secondary | ICD-10-CM | POA: Diagnosis not present

## 2020-04-20 DIAGNOSIS — J3089 Other allergic rhinitis: Secondary | ICD-10-CM | POA: Diagnosis not present

## 2020-04-27 DIAGNOSIS — J301 Allergic rhinitis due to pollen: Secondary | ICD-10-CM | POA: Diagnosis not present

## 2020-04-27 DIAGNOSIS — J3089 Other allergic rhinitis: Secondary | ICD-10-CM | POA: Diagnosis not present

## 2020-04-27 DIAGNOSIS — J3081 Allergic rhinitis due to animal (cat) (dog) hair and dander: Secondary | ICD-10-CM | POA: Diagnosis not present

## 2020-05-04 DIAGNOSIS — J3081 Allergic rhinitis due to animal (cat) (dog) hair and dander: Secondary | ICD-10-CM | POA: Diagnosis not present

## 2020-05-04 DIAGNOSIS — J301 Allergic rhinitis due to pollen: Secondary | ICD-10-CM | POA: Diagnosis not present

## 2020-05-04 DIAGNOSIS — J3089 Other allergic rhinitis: Secondary | ICD-10-CM | POA: Diagnosis not present

## 2020-05-11 DIAGNOSIS — J301 Allergic rhinitis due to pollen: Secondary | ICD-10-CM | POA: Diagnosis not present

## 2020-05-11 DIAGNOSIS — J3089 Other allergic rhinitis: Secondary | ICD-10-CM | POA: Diagnosis not present

## 2020-05-11 DIAGNOSIS — J3081 Allergic rhinitis due to animal (cat) (dog) hair and dander: Secondary | ICD-10-CM | POA: Diagnosis not present

## 2020-05-18 DIAGNOSIS — J301 Allergic rhinitis due to pollen: Secondary | ICD-10-CM | POA: Diagnosis not present

## 2020-05-18 DIAGNOSIS — J3081 Allergic rhinitis due to animal (cat) (dog) hair and dander: Secondary | ICD-10-CM | POA: Diagnosis not present

## 2020-05-18 DIAGNOSIS — J3089 Other allergic rhinitis: Secondary | ICD-10-CM | POA: Diagnosis not present

## 2020-05-25 DIAGNOSIS — J3089 Other allergic rhinitis: Secondary | ICD-10-CM | POA: Diagnosis not present

## 2020-05-25 DIAGNOSIS — J3081 Allergic rhinitis due to animal (cat) (dog) hair and dander: Secondary | ICD-10-CM | POA: Diagnosis not present

## 2020-05-25 DIAGNOSIS — J301 Allergic rhinitis due to pollen: Secondary | ICD-10-CM | POA: Diagnosis not present

## 2020-05-30 DIAGNOSIS — R140 Abdominal distension (gaseous): Secondary | ICD-10-CM | POA: Diagnosis not present

## 2020-05-30 DIAGNOSIS — E538 Deficiency of other specified B group vitamins: Secondary | ICD-10-CM | POA: Diagnosis not present

## 2020-05-30 DIAGNOSIS — N926 Irregular menstruation, unspecified: Secondary | ICD-10-CM | POA: Diagnosis not present

## 2020-05-30 DIAGNOSIS — K5900 Constipation, unspecified: Secondary | ICD-10-CM | POA: Diagnosis not present

## 2020-05-30 DIAGNOSIS — G4700 Insomnia, unspecified: Secondary | ICD-10-CM | POA: Diagnosis not present

## 2020-05-30 DIAGNOSIS — Z1329 Encounter for screening for other suspected endocrine disorder: Secondary | ICD-10-CM | POA: Diagnosis not present

## 2020-05-30 DIAGNOSIS — E559 Vitamin D deficiency, unspecified: Secondary | ICD-10-CM | POA: Diagnosis not present

## 2020-06-08 DIAGNOSIS — J3081 Allergic rhinitis due to animal (cat) (dog) hair and dander: Secondary | ICD-10-CM | POA: Diagnosis not present

## 2020-06-08 DIAGNOSIS — J3089 Other allergic rhinitis: Secondary | ICD-10-CM | POA: Diagnosis not present

## 2020-06-08 DIAGNOSIS — J301 Allergic rhinitis due to pollen: Secondary | ICD-10-CM | POA: Diagnosis not present

## 2020-06-14 DIAGNOSIS — J3089 Other allergic rhinitis: Secondary | ICD-10-CM | POA: Diagnosis not present

## 2020-06-14 DIAGNOSIS — J3081 Allergic rhinitis due to animal (cat) (dog) hair and dander: Secondary | ICD-10-CM | POA: Diagnosis not present

## 2020-06-14 DIAGNOSIS — J301 Allergic rhinitis due to pollen: Secondary | ICD-10-CM | POA: Diagnosis not present

## 2020-06-22 DIAGNOSIS — J3089 Other allergic rhinitis: Secondary | ICD-10-CM | POA: Diagnosis not present

## 2020-06-22 DIAGNOSIS — J3081 Allergic rhinitis due to animal (cat) (dog) hair and dander: Secondary | ICD-10-CM | POA: Diagnosis not present

## 2020-06-22 DIAGNOSIS — J301 Allergic rhinitis due to pollen: Secondary | ICD-10-CM | POA: Diagnosis not present

## 2020-06-29 DIAGNOSIS — J301 Allergic rhinitis due to pollen: Secondary | ICD-10-CM | POA: Diagnosis not present

## 2020-06-29 DIAGNOSIS — J3081 Allergic rhinitis due to animal (cat) (dog) hair and dander: Secondary | ICD-10-CM | POA: Diagnosis not present

## 2020-06-29 DIAGNOSIS — J3089 Other allergic rhinitis: Secondary | ICD-10-CM | POA: Diagnosis not present

## 2020-07-06 DIAGNOSIS — J3089 Other allergic rhinitis: Secondary | ICD-10-CM | POA: Diagnosis not present

## 2020-07-06 DIAGNOSIS — J301 Allergic rhinitis due to pollen: Secondary | ICD-10-CM | POA: Diagnosis not present

## 2020-07-06 DIAGNOSIS — J3081 Allergic rhinitis due to animal (cat) (dog) hair and dander: Secondary | ICD-10-CM | POA: Diagnosis not present

## 2020-07-07 DIAGNOSIS — R140 Abdominal distension (gaseous): Secondary | ICD-10-CM | POA: Diagnosis not present

## 2020-07-07 DIAGNOSIS — K5900 Constipation, unspecified: Secondary | ICD-10-CM | POA: Diagnosis not present

## 2020-07-07 DIAGNOSIS — N926 Irregular menstruation, unspecified: Secondary | ICD-10-CM | POA: Diagnosis not present

## 2020-07-07 DIAGNOSIS — G4700 Insomnia, unspecified: Secondary | ICD-10-CM | POA: Diagnosis not present

## 2020-07-18 NOTE — H&P (Signed)
Karen Frazier is an 40 y.o. female. G3P3. Prevop visit at [facility] on 07/18/2020. Patient is scheduled for total vaginal hysterectomy/bilateral salpingectomy/cystoscopy due to abnormal uterine bleeding (intermenstrual bleeding, likely due to adenomyosis and polyp, suspected based on [place]).  She has a history of irregular bleeding - reports for 3 weeks she has bleeding (spotting to regular bleeding) followed by a week period. During her period she changes her pad frequently. She is frustrated with the unpredictable bleeding. Has been on cryselle (COCs) since 2019 and continues to bleed during active pill weeks. Previously tried IUD but mucous discharge with IUD just as bothersome as unpredictable bleeding. She did a trial of IUD because another Gyn did not want to prescribe her COCs due to history of superficial thrombophlebitis. Most recently, she established with a PCP who stopped cryselle in Nov 2021 due to concern for new borthersome symptoms being hormonal. Patient reports these symptoms were: sleep issues, migraines, constipation (right before period). Initially tried CBD oil, magnesium. She then was started on progesterone (07/15/2020). When she stopped COCs her bleeding was heavier during the spotting (throughout month), continued same monthly period. Denies pelvic pain, dyspareunia.  Work up includes: No history of abnormal pap smears  04/06/2020 Hgb 12.2, A1c 5.5% 04/06/2020 EMB: proliferative, benign 03/18/2020 benign endocervical polyp  03/03/2019 & 09/04/2017 Pap NILM/HPV Neg 05/21/2018 EMB proliferative endometrium 05/21/2018 SIS/US: uterus 6.8x4.87x3.97cm Adenomyosis and 2 uterine masses (1.4x0.38cm and 0.72x0.19cm - likely polyps, declined hysteroscopy/D&C)  Past Medical History:  Diagnosis Date   Abnormal uterine bleeding (AUB)    Acute superficial venous thrombosis of right lower extremity 2016   Anemia    Asthma    Eczema    Environmental allergies    GERD (gastroesophageal reflux  disease)    History of chicken pox    Seasonal allergies     Past Surgical History:  Procedure Laterality Date   NO PAST SURGERIES      Family History  Problem Relation Age of Onset   Hyperlipidemia Father        Living   Hypertension Father    Diabetes Mother        Living   Hypertension Mother    Allergy (severe) Mother        Alpha-gal   Other Mother        IVP Dye   Heart disease Maternal Grandfather    COPD Paternal Grandfather    Healthy Sister        x1   Allergies Daughter        x1   Healthy Daughter        x2   Diabetes Maternal Aunt    Hyperlipidemia Maternal Aunt    Hypertension Maternal Aunt    Alcohol abuse Maternal Uncle    Heart attack Maternal Uncle    Hyperlipidemia Maternal Uncle    Hypertension Maternal Uncle     Social History:  reports that she has never smoked. She has never used smokeless tobacco. She reports current alcohol use of about 1.0 standard drink of alcohol per week. She reports that she does not use drugs.  Allergies:  Allergies  Allergen Reactions   Gelatin Anaphylaxis    Nothing Made In Gel [caps]   Other Anaphylaxis    ALL Mammal Meat Products    No medications prior to admission.    Review of Systems - per HPI all other pertinent ROS neg  Height 5\' 4"  (1.626 m), weight 182 lb (82.6 kg), last menstrual period 07/20/2020. Physical  Exam  Office exam 07/18/2020  Patient is a 40 year old female.  Chaperone: Chaperone: present.  Constitutional: *General Appearance: healthy-appearing.  Cardiovascular: *Auscultation: RRR and no murmur.  Lungs: *Respiratory Effort: no accessory muscle usage. *Auscultation: clear to auscultation.  Abdomen: *Inspection/Palpation/Auscultation: non-distended and soft.  Female Genitalia: Vulva: no masses, atrophy, or lesions. Mons: normal. Labia Majora: normal. Labia Minora: normal. Introitus: normal. *Vagina: no discharge or blood present and normal. *Cervix: no lesions or discharge and  grossly normal. *Uterus: normal size and contour and midline, mobile, and non-tender. *Adnexa/Parametria: no tenderness or mass palpable.  No results found for this or any previous visit (from the past 24 hour(s)).  No results found.  Assessment/Plan: 40yo G3P3 here for preop, scheduled for total vaginal hysterectomy/bilateral salpingectomy/cystoscopy due to abnormal uterine bleeding (intermenstrual bleeding, likely due to adenomyosis and polyp, suspected based on [place]).  Surgery: The nature of the procedure was discussed with the patient in detail, using models, diagrams, and educational materials as needed. An informed discussion was held regarding the risks and benefits of surgical intervention. Specifically, the patient was apprised of risks of pain, bleeding requiring blood transfusion, infection requiring antibiotics, injury to nearby organs (bowel, bladder, nerves, blood vessels, ureter), need for laparotomy to complete the operation, or failure to achieve desired results. She was informed of the low but real risk of these complications, and understands that the alternative is no surgery, hysteroscopy, continued medical management. An opportunity to ask questions was provided, and all questions were answered to the patient's satisfaction. Patient expresses understanding of these issues, and agrees to proceed with the plan outlined above. The expected post-operative recovery course was discussed with the patient, and post-operative instructions were reviewed. Postop scheduled 08/11/2020 History of occlusive superficial thrombophlebitis within the right lesser saphenous vein without definitive extension to the deep venous system of the right lower extremity. This was found 04/2016 for calf pain. She saw hematology at this time who discussed that this is NOT a DVT, therefore NOT a contraindication to combined oral contraceptives and that she did NOT need a thrombophilia workup.  -Caprini VTE score =  medium risk, plan for SCDs  Nimra Puccinelli K Taam-Akelman 07/21/2020, 3:48 PM

## 2020-07-20 ENCOUNTER — Encounter (HOSPITAL_BASED_OUTPATIENT_CLINIC_OR_DEPARTMENT_OTHER): Payer: Self-pay | Admitting: Obstetrics & Gynecology

## 2020-07-20 ENCOUNTER — Other Ambulatory Visit: Payer: Self-pay

## 2020-07-20 DIAGNOSIS — J3089 Other allergic rhinitis: Secondary | ICD-10-CM | POA: Diagnosis not present

## 2020-07-20 DIAGNOSIS — J3081 Allergic rhinitis due to animal (cat) (dog) hair and dander: Secondary | ICD-10-CM | POA: Diagnosis not present

## 2020-07-20 DIAGNOSIS — J301 Allergic rhinitis due to pollen: Secondary | ICD-10-CM | POA: Diagnosis not present

## 2020-07-20 NOTE — Progress Notes (Signed)
Spoke w/ via phone for pre-op interview---pt Lab needs dos---- urine preg          Lab results------lab appt 07-25-2020 900 am for cbc bmp t & s drink COVID test ------07-25-2020 815  Arrive at -------745 am 07-27-2020 NPO after MN NO Solid Food.  Clear liquids from MN until---645 am drink ensure pre surgery drink at 645 am then npo Medications to take morning of surgery ----- Diabetic medication -----bring albuterol inhaler use prn zantac Patient Special Instructions -----pt given overnight/extended stay instructions Pre-Op special Istructions -----none Patient verbalized understanding of instructions that were given at this phone interview. Patient denies shortness of breath, chest pain, fever, cough at this phone interview.

## 2020-07-20 NOTE — Progress Notes (Signed)
YOU ARE SCHEDULED FOR A COVID TEST 07-25-2020 @_  815 am. THIS TEST MUST BE DONE BEFORE SURGERY. GO TO  [address] [place], [place], IT IS APPROXIMATELY 2 MINUTES PAST [employer] ON THE RIGHT AND REMAIN IN YOUR CAR, THIS IS A DRIVE UP TEST. ONCE YOUR COVID TEST IS DONE PLEASE FOLLOW ALL THE QUARANTINE  INSTRUCTIONS GIVEN IN YOUR HANDOUT.      Your procedure is scheduled on 07-27-2020  Report to [facility] AT 745  A. M.   Call this number if you have problems the morning of surgery  :[phone].   OUR ADDRESS IS [address].  [facility] ARE LOCATED IN [facility].  PLEASE BRING YOUR INSURANCE CARD AND PHOTO ID DAY OF SURGERY.  ONLY ONE PERSON ALLOWED IN FACILITY WAITING AREA.                                     REMEMBER:  DO NOT EAT FOOD, CANDY GUM OR MINTS  AFTER MIDNIGHT . YOU MAY HAVE CLEAR LIQUIDS FROM MIDNIGHT UNTIL 645 AM. NO CLEAR LIQUIDS AFTER 645 AM DAY OF SURGERY.   YOU MAY  BRUSH YOUR TEETH MORNING OF SURGERY AND RINSE YOUR MOUTH OUT, NO CHEWING GUM CANDY OR MINTS.  NO SOLID FOOD AFTER MIDNIGHT THE NIGHT PRIOR TO SURGERY. NOTHING BY MOUTH EXCEPT CLEAR LIQUIDS UNTIL 645  AM . PLEASE FINISH ENSURE DRINK PER SURGEON ORDER  WHICH NEEDS TO BE COMPLETED AT 645 AM .  CLEAR LIQUID DIET   Foods Allowed                                                                     Foods Excluded  Coffee and tea, regular and decaf                             liquids that you cannot  Plain Jell-O any favor except red or purple                                           see through such as: Fruit ices (not with fruit pulp)                                     milk, soups, orange juice  Iced Popsicles                                    All solid food Carbonated beverages, regular and diet                                    Cranberry, grape and apple juices Sports drinks like Gatorade Lightly seasoned clear broth or consume(fat free) Sugar, honey  syrup  Sample Menu Breakfast  Lunch                                     Supper Cranberry juice                    Beef broth                            Chicken broth Jell-O                                     Grape juice                           Apple juice Coffee or tea                        Jell-O                                      Popsicle                                                Coffee or tea                        Coffee or tea  _____________________________________________________________________     TAKE THESE MEDICATIONS MORNING OF SURGERY WITH A SIP OF WATER:  ALBUTEROL INHALER USE IF NEEDED AND BRING INHALER WITH YOU, ZANTAC  ONE VISITOR IS ALLOWED IN WAITING ROOM ONLY DAY OF SURGERY.  NO VISITOR MAY SPEND THE NIGHT.  VISITOR ARE ALLOWED TO STAY UNTIL 800 PM.                                    DO NOT WEAR JEWERLY, MAKE UP, OR NAIL POLISH ON FINGERNAILS. DO NOT WEAR LOTIONS, POWDERS, PERFUMES OR DEODORANT. DO NOT SHAVE FOR 24 HOURS PRIOR TO DAY OF SURGERY. MEN MAY SHAVE FACE AND NECK. CONTACTS, GLASSES, OR DENTURES MAY NOT BE WORN TO SURGERY.                                    [facility] IS NOT RESPONSIBLE  FOR ANY BELONGINGS.                                                                    [facility]           [facility] - Preparing for Surgery Before surgery, you can play an important role.  Because skin is not sterile, your skin needs to be as free of germs as possible.  You can reduce the number of germs on your skin  by washing with CHG (chlorahexidine gluconate) soap before surgery.  CHG is an antiseptic cleaner which kills germs and bonds with the skin to continue killing germs even after washing. Please DO NOT use if you have an allergy to CHG or antibacterial soaps.  If your skin becomes reddened/irritated stop using the CHG and inform your nurse when you arrive at Short Stay. Do not shave (including legs and underarms) for at least 48  hours prior to the first CHG shower.  You may shave your face/neck. Please follow these instructions carefully:  1.  Shower with CHG Soap the night before surgery and the  morning of Surgery.  2.  If you choose to wash your hair, wash your hair first as usual with your  normal  shampoo.  3.  After you shampoo, rinse your hair and body thoroughly to remove the  shampoo.                            4.  Use CHG as you would any other liquid soap.  You can apply chg directly  to the skin and wash                      Gently with a scrungie or clean washcloth.  5.  Apply the CHG Soap to your body ONLY FROM THE NECK DOWN.   Do not use on face/ open                           Wound or open sores. Avoid contact with eyes, ears mouth and genitals (private parts).                       Wash face,  Genitals (private parts) with your normal soap.             6.  Wash thoroughly, paying special attention to the area where your surgery  will be performed.  7.  Thoroughly rinse your body with warm water from the neck down.  8.  DO NOT shower/wash with your normal soap after using and rinsing off  the CHG Soap.                9.  Pat yourself dry with a clean towel.            10.  Wear clean pajamas.            11.  Place clean sheets on your bed the night of your first shower and do not  sleep with pets. Day of Surgery : Do not apply any lotions/deodorants the morning of surgery.  Please wear clean clothes to the hospital/surgery center.  FAILURE TO FOLLOW THESE INSTRUCTIONS MAY RESULT IN THE CANCELLATION OF YOUR SURGERY PATIENT SIGNATURE_________________________________  NURSE SIGNATURE__________________________________  ________________________________________________________________________                                                        QUESTIONS Annye Asa PRE OP NURSE PHONE [phone]

## 2020-07-25 ENCOUNTER — Other Ambulatory Visit: Payer: Self-pay

## 2020-07-25 ENCOUNTER — Encounter (HOSPITAL_COMMUNITY)
Admission: RE | Admit: 2020-07-25 | Discharge: 2020-07-25 | Disposition: A | Discharge status: Home / Self Care | Payer: BC Managed Care – PPO | Source: Ambulatory Visit | Attending: Obstetrics & Gynecology | Admitting: Obstetrics & Gynecology

## 2020-07-25 ENCOUNTER — Other Ambulatory Visit (HOSPITAL_COMMUNITY)
Admission: RE | Admit: 2020-07-25 | Discharge: 2020-07-25 | Disposition: A | Discharge status: Home / Self Care | Payer: BC Managed Care – PPO | Source: Ambulatory Visit | Attending: Obstetrics & Gynecology | Admitting: Obstetrics & Gynecology

## 2020-07-25 DIAGNOSIS — N841 Polyp of cervix uteri: Secondary | ICD-10-CM | POA: Diagnosis not present

## 2020-07-25 DIAGNOSIS — Z01812 Encounter for preprocedural laboratory examination: Secondary | ICD-10-CM | POA: Insufficient documentation

## 2020-07-25 DIAGNOSIS — Z91014 Allergy to mammalian meats: Secondary | ICD-10-CM | POA: Diagnosis not present

## 2020-07-25 DIAGNOSIS — N939 Abnormal uterine and vaginal bleeding, unspecified: Secondary | ICD-10-CM | POA: Diagnosis not present

## 2020-07-25 DIAGNOSIS — Z20822 Contact with and (suspected) exposure to covid-19: Secondary | ICD-10-CM | POA: Diagnosis not present

## 2020-07-25 DIAGNOSIS — N72 Inflammatory disease of cervix uteri: Secondary | ICD-10-CM | POA: Diagnosis not present

## 2020-07-25 DIAGNOSIS — Z91018 Allergy to other foods: Secondary | ICD-10-CM | POA: Diagnosis not present

## 2020-07-25 DIAGNOSIS — N800 Endometriosis of uterus: Secondary | ICD-10-CM | POA: Diagnosis not present

## 2020-07-25 LAB — CBC
HCT: 41.6 % (ref 36.0–46.0)
Hemoglobin: 13.4 g/dL (ref 12.0–15.0)
MCH: 31.1 pg (ref 26.0–34.0)
MCHC: 32.2 g/dL (ref 30.0–36.0)
MCV: 96.5 fL (ref 80.0–100.0)
Platelets: 349 10*3/uL (ref 150–400)
RBC: 4.31 MIL/uL (ref 3.87–5.11)
RDW: 12.6 % (ref 11.5–15.5)
WBC: 9 10*3/uL (ref 4.0–10.5)
nRBC: 0 % (ref 0.0–0.2)

## 2020-07-25 LAB — BASIC METABOLIC PANEL
Anion gap: 11 (ref 5–15)
BUN: 12 mg/dL (ref 6–20)
CO2: 22 mmol/L (ref 22–32)
Calcium: 9.6 mg/dL (ref 8.9–10.3)
Chloride: 106 mmol/L (ref 98–111)
Creatinine, Ser: 0.84 mg/dL (ref 0.44–1.00)
GFR, Estimated: >60 mL/min (ref >60)
Glucose, Bld: 102 mg/dL — ABNORMAL HIGH (ref 70–99)
Potassium: 3.8 mmol/L (ref 3.5–5.1)
Sodium: 139 mmol/L (ref 135–145)

## 2020-07-25 LAB — SARS CORONAVIRUS 2 (TAT 6-24 HRS): SARS Coronavirus 2: NEGATIVE

## 2020-07-27 ENCOUNTER — Encounter (HOSPITAL_BASED_OUTPATIENT_CLINIC_OR_DEPARTMENT_OTHER): Payer: Self-pay | Admitting: Obstetrics & Gynecology

## 2020-07-27 ENCOUNTER — Ambulatory Visit (HOSPITAL_BASED_OUTPATIENT_CLINIC_OR_DEPARTMENT_OTHER): Payer: BC Managed Care – PPO | Admitting: Certified Registered Nurse Anesthetist

## 2020-07-27 ENCOUNTER — Other Ambulatory Visit: Payer: Self-pay

## 2020-07-27 ENCOUNTER — Encounter (HOSPITAL_BASED_OUTPATIENT_CLINIC_OR_DEPARTMENT_OTHER)
Admission: RE | Disposition: A | Discharge status: Home / Self Care | Payer: Self-pay | Source: Home / Self Care | Attending: Obstetrics & Gynecology

## 2020-07-27 ENCOUNTER — Ambulatory Visit (HOSPITAL_BASED_OUTPATIENT_CLINIC_OR_DEPARTMENT_OTHER)
Admission: RE | Admit: 2020-07-27 | Discharge: 2020-07-27 | Disposition: A | Discharge status: Home / Self Care | Payer: BC Managed Care – PPO | Attending: Obstetrics & Gynecology | Admitting: Obstetrics & Gynecology

## 2020-07-27 DIAGNOSIS — N840 Polyp of corpus uteri: Secondary | ICD-10-CM | POA: Diagnosis not present

## 2020-07-27 DIAGNOSIS — D649 Anemia, unspecified: Secondary | ICD-10-CM | POA: Diagnosis not present

## 2020-07-27 DIAGNOSIS — N939 Abnormal uterine and vaginal bleeding, unspecified: Secondary | ICD-10-CM | POA: Insufficient documentation

## 2020-07-27 DIAGNOSIS — Z90710 Acquired absence of both cervix and uterus: Secondary | ICD-10-CM | POA: Diagnosis present

## 2020-07-27 DIAGNOSIS — Z91018 Allergy to other foods: Secondary | ICD-10-CM | POA: Diagnosis not present

## 2020-07-27 DIAGNOSIS — N72 Inflammatory disease of cervix uteri: Secondary | ICD-10-CM | POA: Insufficient documentation

## 2020-07-27 DIAGNOSIS — Z91014 Allergy to mammalian meats: Secondary | ICD-10-CM | POA: Diagnosis not present

## 2020-07-27 DIAGNOSIS — Z20822 Contact with and (suspected) exposure to covid-19: Secondary | ICD-10-CM | POA: Diagnosis not present

## 2020-07-27 DIAGNOSIS — N800 Endometriosis of uterus: Secondary | ICD-10-CM | POA: Diagnosis not present

## 2020-07-27 DIAGNOSIS — N841 Polyp of cervix uteri: Secondary | ICD-10-CM | POA: Diagnosis not present

## 2020-07-27 DIAGNOSIS — J45909 Unspecified asthma, uncomplicated: Secondary | ICD-10-CM | POA: Diagnosis not present

## 2020-07-27 DIAGNOSIS — K219 Gastro-esophageal reflux disease without esophagitis: Secondary | ICD-10-CM | POA: Diagnosis not present

## 2020-07-27 HISTORY — PX: VAGINAL HYSTERECTOMY: SHX2639

## 2020-07-27 HISTORY — DX: Abnormal uterine and vaginal bleeding, unspecified: N93.9

## 2020-07-27 HISTORY — DX: Dermatitis, unspecified: L30.9

## 2020-07-27 HISTORY — DX: Gastro-esophageal reflux disease without esophagitis: K21.9

## 2020-07-27 HISTORY — DX: Anemia, unspecified: D64.9

## 2020-07-27 HISTORY — PX: CYSTOSCOPY: SHX5120

## 2020-07-27 LAB — TYPE AND SCREEN
ABO/RH(D): B POS
Antibody Screen: NEGATIVE

## 2020-07-27 LAB — ABO/RH: ABO/RH(D): B POS

## 2020-07-27 LAB — POCT PREGNANCY, URINE: Preg Test, Ur: NEGATIVE

## 2020-07-27 SURGERY — HYSTERECTOMY, VAGINAL
Anesthesia: General

## 2020-07-27 MED ORDER — KETOROLAC TROMETHAMINE 15 MG/ML IJ SOLN: 15.0000 mg | Freq: Four times a day (QID) | INTRAMUSCULAR | Status: DC

## 2020-07-27 MED ORDER — PROPOFOL 10 MG/ML IV BOLUS
INTRAVENOUS | Status: AC
  Filled 2020-07-27: qty 20

## 2020-07-27 MED ORDER — DEXAMETHASONE SODIUM PHOSPHATE 10 MG/ML IJ SOLN
INTRAMUSCULAR | Status: DC | PRN
  Administered 2020-07-27 (×2): 5 mg via INTRAVENOUS

## 2020-07-27 MED ORDER — ONDANSETRON HCL 4 MG/2ML IJ SOLN
4.0000 mg | Freq: Four times a day (QID) | INTRAMUSCULAR | Status: DC | PRN
  Administered 2020-07-27: 4 mg via INTRAVENOUS

## 2020-07-27 MED ORDER — SUGAMMADEX SODIUM 200 MG/2ML IV SOLN
INTRAVENOUS | Status: DC | PRN
  Administered 2020-07-27: 180 mg via INTRAVENOUS

## 2020-07-27 MED ORDER — ACETAMINOPHEN 500 MG PO TABS
ORAL_TABLET | ORAL | Status: AC
  Filled 2020-07-27: qty 2

## 2020-07-27 MED ORDER — OXYCODONE HCL 5 MG PO TABS: 5.0000 mg | ORAL_TABLET | Freq: Once | ORAL | Status: DC | PRN

## 2020-07-27 MED ORDER — DEXMEDETOMIDINE (PRECEDEX) IN NS 20 MCG/5ML (4 MCG/ML) IV SYRINGE
PREFILLED_SYRINGE | INTRAVENOUS | Status: AC
  Filled 2020-07-27: qty 5

## 2020-07-27 MED ORDER — ONDANSETRON HCL 4 MG/2ML IJ SOLN: 4.0000 mg | Freq: Once | INTRAMUSCULAR | Status: DC | PRN

## 2020-07-27 MED ORDER — AMISULPRIDE (ANTIEMETIC) 5 MG/2ML IV SOLN: 10.0000 mg | Freq: Once | INTRAVENOUS | Status: DC | PRN

## 2020-07-27 MED ORDER — FENTANYL CITRATE (PF) 100 MCG/2ML IJ SOLN
INTRAMUSCULAR | Status: AC
  Filled 2020-07-27: qty 2

## 2020-07-27 MED ORDER — ONDANSETRON HCL 4 MG/2ML IJ SOLN
INTRAMUSCULAR | Status: AC
  Filled 2020-07-27: qty 2

## 2020-07-27 MED ORDER — LACTATED RINGERS IV SOLN: INTRAVENOUS | Status: DC

## 2020-07-27 MED ORDER — ONDANSETRON HCL 4 MG/2ML IJ SOLN
INTRAMUSCULAR | Status: DC | PRN
  Administered 2020-07-27: 4 mg via INTRAVENOUS

## 2020-07-27 MED ORDER — 0.9 % SODIUM CHLORIDE (POUR BTL) OPTIME
TOPICAL | Status: DC | PRN
  Administered 2020-07-27: 1000 mL

## 2020-07-27 MED ORDER — DOCUSATE SODIUM 50 MG PO CAPS: 50.0000 mg | ORAL_CAPSULE | Freq: Two times a day (BID) | ORAL | 0 refills | Status: DC

## 2020-07-27 MED ORDER — ROCURONIUM BROMIDE 10 MG/ML (PF) SYRINGE
PREFILLED_SYRINGE | INTRAVENOUS | Status: DC | PRN
  Administered 2020-07-27: 10 mg via INTRAVENOUS
  Administered 2020-07-27: 60 mg via INTRAVENOUS

## 2020-07-27 MED ORDER — ROCURONIUM BROMIDE 10 MG/ML (PF) SYRINGE
PREFILLED_SYRINGE | INTRAVENOUS | Status: AC
  Filled 2020-07-27: qty 10

## 2020-07-27 MED ORDER — FENTANYL CITRATE (PF) 100 MCG/2ML IJ SOLN
INTRAMUSCULAR | Status: DC | PRN
  Administered 2020-07-27 (×2): 50 ug via INTRAVENOUS

## 2020-07-27 MED ORDER — MIDAZOLAM HCL 2 MG/2ML IJ SOLN
INTRAMUSCULAR | Status: AC
  Filled 2020-07-27: qty 2

## 2020-07-27 MED ORDER — METHYLENE BLUE 0.5 % INJ SOLN
INTRAVENOUS | Status: AC
  Filled 2020-07-27: qty 10

## 2020-07-27 MED ORDER — MIDAZOLAM HCL 2 MG/2ML IJ SOLN
INTRAMUSCULAR | Status: DC | PRN
  Administered 2020-07-27: 2 mg via INTRAVENOUS

## 2020-07-27 MED ORDER — LIDOCAINE-EPINEPHRINE (PF) 1 %-1:200000 IJ SOLN
INTRAMUSCULAR | Status: DC | PRN
  Administered 2020-07-27: 10 mL

## 2020-07-27 MED ORDER — SCOPOLAMINE 1 MG/3DAYS TD PT72
MEDICATED_PATCH | TRANSDERMAL | Status: AC
  Filled 2020-07-27: qty 1

## 2020-07-27 MED ORDER — OXYCODONE HCL 5 MG PO TABS
5.0000 mg | ORAL_TABLET | ORAL | Status: DC | PRN
  Administered 2020-07-27: 5 mg via ORAL

## 2020-07-27 MED ORDER — OXYCODONE HCL 5 MG/5ML PO SOLN: 5.0000 mg | Freq: Once | ORAL | Status: DC | PRN

## 2020-07-27 MED ORDER — ENSURE PRE-SURGERY PO LIQD: 296.0000 mL | Freq: Once | ORAL | Status: DC

## 2020-07-27 MED ORDER — EPHEDRINE 5 MG/ML INJ
INTRAVENOUS | Status: AC
  Filled 2020-07-27: qty 10

## 2020-07-27 MED ORDER — OXYCODONE-ACETAMINOPHEN 5-325 MG PO TABS: 1.0000 | ORAL_TABLET | Freq: Four times a day (QID) | ORAL | 0 refills | Status: DC | PRN

## 2020-07-27 MED ORDER — CEFAZOLIN SODIUM-DEXTROSE 2-4 GM/100ML-% IV SOLN
INTRAVENOUS | Status: AC
  Filled 2020-07-27: qty 100

## 2020-07-27 MED ORDER — GLYCOPYRROLATE PF 0.2 MG/ML IJ SOSY
PREFILLED_SYRINGE | INTRAMUSCULAR | Status: DC | PRN
  Administered 2020-07-27: .2 mg via INTRAVENOUS

## 2020-07-27 MED ORDER — GABAPENTIN 300 MG PO CAPS: 300.0000 mg | ORAL_CAPSULE | Freq: Two times a day (BID) | ORAL | Status: DC

## 2020-07-27 MED ORDER — POVIDONE-IODINE 10 % EX SWAB: 2.0000 "application " | Freq: Once | CUTANEOUS | Status: DC

## 2020-07-27 MED ORDER — SCOPOLAMINE 1 MG/3DAYS TD PT72
1.0000 | MEDICATED_PATCH | TRANSDERMAL | Status: DC
  Administered 2020-07-27: 1.5 mg via TRANSDERMAL

## 2020-07-27 MED ORDER — EPHEDRINE SULFATE-NACL 50-0.9 MG/10ML-% IV SOSY
PREFILLED_SYRINGE | INTRAVENOUS | Status: DC | PRN
  Administered 2020-07-27: 10 mg via INTRAVENOUS

## 2020-07-27 MED ORDER — ONDANSETRON 4 MG PO TBDP: 4.0000 mg | ORAL_TABLET | Freq: Four times a day (QID) | ORAL | Status: DC | PRN

## 2020-07-27 MED ORDER — LIDOCAINE HCL (PF) 2 % IJ SOLN
INTRAMUSCULAR | Status: AC
  Filled 2020-07-27: qty 5

## 2020-07-27 MED ORDER — KETOROLAC TROMETHAMINE 30 MG/ML IJ SOLN
INTRAMUSCULAR | Status: DC | PRN
  Administered 2020-07-27: 30 mg via INTRAVENOUS

## 2020-07-27 MED ORDER — DEXAMETHASONE SODIUM PHOSPHATE 10 MG/ML IJ SOLN
INTRAMUSCULAR | Status: AC
  Filled 2020-07-27: qty 1

## 2020-07-27 MED ORDER — METHYLENE BLUE 0.5 % INJ SOLN
INTRAVENOUS | Status: DC | PRN
  Administered 2020-07-27: 30 mg via INTRAVENOUS

## 2020-07-27 MED ORDER — PROPOFOL 10 MG/ML IV BOLUS
INTRAVENOUS | Status: DC | PRN
  Administered 2020-07-27: 200 mg via INTRAVENOUS

## 2020-07-27 MED ORDER — CEFAZOLIN SODIUM-DEXTROSE 2-4 GM/100ML-% IV SOLN
2.0000 g | INTRAVENOUS | Status: AC
  Administered 2020-07-27: 2 g via INTRAVENOUS

## 2020-07-27 MED ORDER — ACETAMINOPHEN 500 MG PO TABS
1000.0000 mg | ORAL_TABLET | ORAL | Status: AC
  Administered 2020-07-27: 1000 mg via ORAL

## 2020-07-27 MED ORDER — HYDROMORPHONE HCL 1 MG/ML IJ SOLN: 1.0000 mg | INTRAMUSCULAR | Status: DC | PRN

## 2020-07-27 MED ORDER — KETOROLAC TROMETHAMINE 30 MG/ML IJ SOLN
INTRAMUSCULAR | Status: AC
  Filled 2020-07-27: qty 1

## 2020-07-27 MED ORDER — ACETAMINOPHEN 500 MG PO TABS
1000.0000 mg | ORAL_TABLET | Freq: Four times a day (QID) | ORAL | Status: DC
  Administered 2020-07-27: 1000 mg via ORAL

## 2020-07-27 MED ORDER — FENTANYL CITRATE (PF) 100 MCG/2ML IJ SOLN
25.0000 ug | INTRAMUSCULAR | Status: DC | PRN
Start: 2020-07-27 — End: 2020-07-28

## 2020-07-27 MED ORDER — GLYCOPYRROLATE PF 0.2 MG/ML IJ SOSY
PREFILLED_SYRINGE | INTRAMUSCULAR | Status: AC
  Filled 2020-07-27: qty 1

## 2020-07-27 MED ORDER — IBUPROFEN 800 MG PO TABS: 800.0000 mg | ORAL_TABLET | Freq: Three times a day (TID) | ORAL | 0 refills | Status: DC | PRN

## 2020-07-27 MED ORDER — SIMETHICONE 80 MG PO CHEW: 40.0000 mg | CHEWABLE_TABLET | Freq: Four times a day (QID) | ORAL | Status: DC | PRN

## 2020-07-27 MED ORDER — SENNA 8.6 MG PO TABS: 1.0000 | ORAL_TABLET | Freq: Two times a day (BID) | ORAL | Status: DC

## 2020-07-27 MED ORDER — LIDOCAINE 2% (20 MG/ML) 5 ML SYRINGE
INTRAMUSCULAR | Status: DC | PRN
  Administered 2020-07-27: 60 mg via INTRAVENOUS

## 2020-07-27 MED ORDER — KETOROLAC TROMETHAMINE 15 MG/ML IJ SOLN: 15.0000 mg | INTRAMUSCULAR | Status: DC

## 2020-07-27 MED ORDER — OXYCODONE HCL 5 MG PO TABS
ORAL_TABLET | ORAL | Status: AC
  Filled 2020-07-27: qty 1

## 2020-07-27 MED ORDER — DEXMEDETOMIDINE (PRECEDEX) IN NS 20 MCG/5ML (4 MCG/ML) IV SYRINGE
PREFILLED_SYRINGE | INTRAVENOUS | Status: DC | PRN
  Administered 2020-07-27 (×3): 4 ug via INTRAVENOUS

## 2020-07-27 MED ORDER — GABAPENTIN 300 MG PO CAPS
300.0000 mg | ORAL_CAPSULE | ORAL | Status: AC
  Administered 2020-07-27: 300 mg via ORAL

## 2020-07-27 MED ORDER — GABAPENTIN 300 MG PO CAPS
ORAL_CAPSULE | ORAL | Status: AC
  Filled 2020-07-27: qty 1

## 2020-07-27 SURGICAL SUPPLY — 21 items
CANISTER SUCT 3000ML PPV (MISCELLANEOUS) ×3 IMPLANT
COVER WAND RF STERILE (DRAPES) ×3 IMPLANT
DECANTER SPIKE VIAL GLASS SM (MISCELLANEOUS) IMPLANT
GLOVE BIOGEL PI IND STRL 6 (GLOVE) ×2 IMPLANT
GLOVE BIOGEL PI INDICATOR 6 (GLOVE) ×1
GLOVE ECLIPSE 6.0 STRL STRAW (GLOVE) ×3 IMPLANT
GOWN STRL REUS W/TWL LRG LVL3 (GOWN DISPOSABLE) ×6 IMPLANT
KIT TURNOVER CYSTO (KITS) ×3 IMPLANT
NS IRRIG 1000ML POUR BTL (IV SOLUTION) ×3 IMPLANT
PACK VAGINAL WOMENS (CUSTOM PROCEDURE TRAY) ×3 IMPLANT
PAD OB MATERNITY 4.3X12.25 (PERSONAL CARE ITEMS) ×3 IMPLANT
SUT VIC AB 0 CT1 18XCR BRD8 (SUTURE) ×4 IMPLANT
SUT VIC AB 0 CT1 27 (SUTURE) ×6
SUT VIC AB 0 CT1 27XBRD ANBCTR (SUTURE) ×4 IMPLANT
SUT VIC AB 0 CT1 8-18 (SUTURE) ×6
SUT VIC AB 2-0 CT1 27 (SUTURE)
SUT VIC AB 2-0 CT1 TAPERPNT 27 (SUTURE) IMPLANT
SUT VICRYL 0 TIES 12 18 (SUTURE) ×3 IMPLANT
TOWEL OR 17X26 10 PK STRL BLUE (TOWEL DISPOSABLE) ×3 IMPLANT
TRAY FOLEY W/BAG SLVR 14FR LF (SET/KITS/TRAYS/PACK) ×3 IMPLANT
UNDERPAD 30X36 HEAVY ABSORB (UNDERPADS AND DIAPERS) ×3 IMPLANT

## 2020-07-27 NOTE — Transfer of Care (Signed)
Immediate Anesthesia Transfer of Care Note  Patient: Karen Frazier  Procedure(s) Performed: Procedure(s) (LRB): HYSTERECTOMY VAGINALw/ Bilateral salpingectomy (Bilateral) CYSTOSCOPY (N/A)  Patient Location: PACU  Anesthesia Type: General  Level of Consciousness: awake, oriented, sedated and patient cooperative  Airway & Oxygen Therapy: Patient Spontanous Breathing and Patient connected to face mask oxygen  Post-op Assessment: Report given to PACU RN and Post -op Vital signs reviewed and stable  Post vital signs: Reviewed and stable  Complications: No apparent anesthesia complications  Last Vitals:  Vitals Value Taken Time  BP 112/72 07/27/20 1004  Temp    Pulse 94 07/27/20 1006  Resp 21 07/27/20 1006  SpO2 99 % 07/27/20 1006  Vitals shown include unvalidated device data.  Last Pain:  Vitals:   07/27/20 0804  TempSrc: Oral  PainSc: 0-No pain      Patients Stated Pain Goal: 5 (07/27/20 0804)  Complications: No complications documented.

## 2020-07-27 NOTE — Interval H&P Note (Signed)
History and Physical Interval Note:  07/27/2020 8:20 AM  Karen Frazier  has presented today for surgery, with the diagnosis of Abnormal uterine bleeding.  The various methods of treatment have been discussed with the patient and family. After consideration of risks, benefits and other options for treatment, the patient has consented to  Procedure(s): HYSTERECTOMY VAGINALw/ Bilateral salpingectomy (Bilateral) CYSTOSCOPY (N/A) as a surgical intervention.  The patient's history has been reviewed, patient examined, no change in status, stable for surgery.  I have reviewed the patient's chart and labs.  Questions were answered to the patient's satisfaction.    BP 135/86   Pulse 93   Temp (!) 97 F (36.1 C) (Oral)   Resp 17   Ht 5\' 4"  (1.626 m)   Wt 81.8 kg   LMP 07/20/2020   SpO2 100%   BMI 30.97 kg/m  Recent Labs    07/25/20 0919  WBC 9.0  HGB 13.4  HCT 41.6  PLT 349    Recent Labs    07/25/20 0919  NA 139  K 3.8  CL 106  BUN 12  CREATININE 0.84  GLUCOSE 102*    Recent Labs    07/25/20 0919  CALCIUM 9.6      Brendt Dible K Taam-Akelman

## 2020-07-27 NOTE — Anesthesia Postprocedure Evaluation (Signed)
Anesthesia Post Note  Patient: Karen Frazier  Procedure(s) Performed: HYSTERECTOMY VAGINALw/ Bilateral salpingectomy (Bilateral ) CYSTOSCOPY (N/A )     Patient location during evaluation: PACU Anesthesia Type: General Level of consciousness: awake and alert Pain management: pain level controlled Vital Signs Assessment: post-procedure vital signs reviewed and stable Respiratory status: spontaneous breathing, nonlabored ventilation and respiratory function stable Cardiovascular status: blood pressure returned to baseline and stable Postop Assessment: no apparent nausea or vomiting Anesthetic complications: no   No complications documented.  Last Vitals:  Vitals:   07/27/20 1030 07/27/20 1045  BP: (!) 100/52 100/63  Pulse: 68 74  Resp: (!) 24 20  Temp:    SpO2: 98% 98%    Last Pain:  Vitals:   07/27/20 1004  TempSrc:   PainSc: 0-No pain                 Lucretia Kern

## 2020-07-27 NOTE — Anesthesia Procedure Notes (Signed)
Procedure Name: Intubation Date/Time: 07/27/2020 8:37 AM Performed by: Suan Halter, CRNA Pre-anesthesia Checklist: Patient identified, Emergency Drugs available, Suction available and Patient being monitored Patient Re-evaluated:Patient Re-evaluated prior to induction Oxygen Delivery Method: Circle system utilized Preoxygenation: Pre-oxygenation with 100% oxygen Induction Type: IV induction Ventilation: Mask ventilation without difficulty Laryngoscope Size: Mac and 3 Grade View: Grade I Tube type: Oral Tube size: 7.0 mm Number of attempts: 1 Airway Equipment and Method: Stylet and Oral airway Placement Confirmation: ETT inserted through vocal cords under direct vision,  positive ETCO2 and breath sounds checked- equal and bilateral Secured at: 22 cm Tube secured with: Tape Dental Injury: Teeth and Oropharynx as per pre-operative assessment

## 2020-07-27 NOTE — Op Note (Addendum)
OPERATIVE NOTE PATIENT:  Karen Frazier  40 y.o. female  PRE-OPERATIVE DIAGNOSIS:  Abnormal uterine bleeding  POST-OPERATIVE DIAGNOSIS: abnormal uterine bleeding  PROCEDURE:  Procedure(s): HYSTERECTOMY VAGINALw/ Bilateral salpingectomy (Bilateral) CYSTOSCOPY (N/A)  SURGEON:  Surgeon(s) and Role:    * Taam-Akelman, Griselda Miner, MD - Primary    * Carrington Clamp, MD - Assisting  ASSISTANTS: none   ANESTHESIA:   general  EBL: 50cc  BLOOD ADMINISTERED:none  DRAINS: none   LOCAL MEDICATIONS USED: 1% lidocaine with epinephrine   SPECIMEN:  Source of Specimen:  uterus, cervix,bilateral fallopian tubes  DISPOSITION OF SPECIMEN:  PATHOLOGY  COUNTS:  YES  TOURNIQUET:  * No tourniquets in log *  PLAN OF CARE: Discharge to home after PACU vs. 23 hour observation  PATIENT DISPOSITION:  PACU - hemodynamically stable.   Delay start of Pharmacological VTE agent (>24hrs) due to surgical blood loss or risk of bleeding: not applicable FINDINGS: Exam under anesthesia revealed small mobile uterus. Intraoperative findings included normal ovaries and fallopian tubes. On cystoscopy the bladder was intact and bilateral spill was seen from each ureteral orfice.  DESCRIPTION OF PROCEDURE: After consent was verified, the patient was taken to the operating room where general anesthesia was administered without difficulty.  The patient was placed in the dorsal lithotomy position using allen stirups.  Exam under anesthesia revealed small mobile uterus. The vagina was then prepped and draped in a normal sterile fashion. A foley catheter was placed.  A weighted speculum was placed in the vagina and the Deaver was placed anteriorly. The anterior and posterior lip of the cervix was grasped with a tenaculum.  The cervicovaginal epithelium was injected with 1% lidocaine with epinephrine in a circumferential fashion.  The epithelium was incised using a 10 blade scalpel circumferentially.  The  vaginal tissues on the posterior aspect of the cervix was dissected to mobilize the rectum.  A posterior colpotomy was made with Mayo scissors and the incision was bluntly extended.  The posterior peritoneum was tacked to the vaginal cuff with 0 Vicryl.  The weighted speculum was replaced with a long weighted speculum through the posterior colpotomy to retract the rectum.  The left uterosacral ligament was clamped with a Heaney clamp transected using curved Mayo scissors and suture ligated with 0 Vicryl.  The right uterosacral ligament was clamped cut and tied in a similar fashion.  In a sequential fashion the uterine arteries and cardinal ligaments were clamped cut and tied with 0 Vicryl suture.  Attention was turned anteriorly and dissection of the pubovesicocervical fascia was performed using sharp and blunt dissection.  The anterior peritoneum was identified and sharply entered with Metzenbaum scissors.  This incision was bluntly extended and a retractor was placed in the peritoneal space to retract the bladder anteriorly.  The broad ligament and the uterine ovarian ligaments were clamped cut and tied with 0 Vicryl.  The cervix and uterus were then handed off.  The right fallopian tube was then grasped with a Babcock and a Heaney clamp was placed across the mesosalpinx, the fallopian tube was then transected and 0-vicryl was used to tie of the pedicle. The left fallopian tube was removed in a similar fashion. Both ovaries appeared normal and were left in situ.  The pedicles were examined and excellent hemostasis was noted. The peritoneum was then closed in a purse-string fashion using 0 Vicryl and incorporating the uterosacrals.  The vaginal cuff was closed horizontally with 0 Vicryl suture in a figure-of-eight stitches.  Good hemostasis was  noted.  Foley catheter was then removed and cystoscopy was performed noting that the bladder was intact and bilateral spill was seen from each ureteral orifice.  The Foley  catheter was then replaced.  The patient tolerated the procedure well.  All sponge instrument and needle counts were correct.  The patient was taken to the recovery room in stable condition.  Isaiyah Feldhaus K Taam-Akelman 07/27/20 10:09 AM

## 2020-07-27 NOTE — Anesthesia Preprocedure Evaluation (Signed)
Anesthesia Evaluation  Patient identified by MRN, date of birth, ID band Patient awake    Reviewed: Allergy & Precautions, NPO status , Patient's Chart, lab work & pertinent test results  History of Anesthesia Complications Negative for: history of anesthetic complications  Airway Mallampati: II  TM Distance: >3 FB Neck ROM: Full    Dental  (+) Teeth Intact   Pulmonary asthma ,    Pulmonary exam normal        Cardiovascular negative cardio ROS Normal cardiovascular exam     Neuro/Psych negative neurological ROS  negative psych ROS   GI/Hepatic Neg liver ROS, GERD  ,  Endo/Other  negative endocrine ROS  Renal/GU negative Renal ROS  negative genitourinary   Musculoskeletal negative musculoskeletal ROS (+)   Abdominal   Peds  Hematology negative hematology ROS (+)   Anesthesia Other Findings   Reproductive/Obstetrics                            Anesthesia Physical Anesthesia Plan  ASA: II  Anesthesia Plan: General   Post-op Pain Management:    Induction: Intravenous  PONV Risk Score and Plan: 3 and Ondansetron, Dexamethasone, Treatment may vary due to age or medical condition and Midazolam  Airway Management Planned: Oral ETT  Additional Equipment: None  Intra-op Plan:   Post-operative Plan: Extubation in OR  Informed Consent: I have reviewed the patients History and Physical, chart, labs and discussed the procedure including the risks, benefits and alternatives for the proposed anesthesia with the patient or authorized representative who has indicated his/her understanding and acceptance.     Dental advisory given  Plan Discussed with:   Anesthesia Plan Comments:         Anesthesia Quick Evaluation

## 2020-07-27 NOTE — Discharge Summary (Addendum)
Physician Discharge Summary  Patient ID: Karen Frazier MRN: [id number] DOB/AGE: 16-Feb-1981 40 y.o.  Admit date: 07/27/2020 Discharge date: 07/27/2020 Admission Diagnoses: abnormal uterine bleeding  Discharge Diagnoses:  Active Problems:   S/P hysterectomy   Discharged Condition: good  Hospital Course:  The patient was admitted through pre-op holding where her consent was reviewed and all questions were answered. She was taken to the operating room by attending surgeon Dr. Pollie Meyer. She underwent an uncomplicated total vaginal hysterectomy, bilateral salpingectomy and cystoscopy. Please see operative dictation for further details. Following her surgery she was taken to the PACU for recovery and then transferred to extended recovery. On the afternoon of POD#0 her foley was removed. She was tolerating a regular diet, her pain was controlled on po pain medications, she was ambulating without assistance and voiding spontaneously. By POD#0 afternoon, she was meeting all goals and deemed stable for discharge.  Consults: None  Significant Diagnostic Studies:  Recent Labs    07/25/20 0919  WBC 9.0  HGB 13.4  HCT 41.6  PLT 349  NA 139  K 3.8  CL 106  BUN 12  CREATININE 0.84   Treatments: surgery: total vaginal hysterectomy, bilateral salpingectomy, cystoscopy Discharge Exam: Blood pressure 126/76, pulse 80, temperature 99 F (37.2 C), temperature source Oral, resp. rate 16, height 5\' 4"  (1.626 m), weight 81.8 kg, last menstrual period 07/20/2020, SpO2 97 %. Resting comfortably Abdomen soft, nondistended SCDs on  Disposition: Discharge disposition: 01-Home or Self Care        Discharge Instructions    Call MD for:  difficulty breathing, headache or visual disturbances   Complete by: As directed    Call MD for:  extreme fatigue   Complete by: As directed    Call MD for:  hives   Complete by: As directed    Call MD for:  persistant dizziness or light-headedness   Complete  by: As directed    Call MD for:  persistant nausea and vomiting   Complete by: As directed    Call MD for:  redness, tenderness, or signs of infection (pain, swelling, redness, odor or green/yellow discharge around incision site)   Complete by: As directed    Call MD for:  severe uncontrolled pain   Complete by: As directed    Call MD for:  temperature >100.4   Complete by: As directed    Diet general   Complete by: As directed    Increase activity slowly   Complete by: As directed    Lifting restrictions   Complete by: As directed    No lifting >10lbs for 6 weeks   No wound care   Complete by: As directed    Sexual Activity Restrictions   Complete by: As directed    Nothing in the vagina for 6-8 weeks     Allergies as of 07/27/2020      Reactions   Gelatin Anaphylaxis   Nothing Made In Gel [caps]   Other Anaphylaxis   ALL Mammal Meat Products      Medication List    TAKE these medications   albuterol 108 (90 Base) MCG/ACT inhaler Commonly known as: VENTOLIN HFA Inhale 1-2 puffs into the lungs every 6 (six) hours as needed for wheezing or shortness of breath.   docusate sodium 50 MG capsule Commonly known as: COLACE Take 1 capsule (50 mg total) by mouth 2 (two) times daily.   EPINEPHrine 0.3 mg/0.3 mL Soaj injection Commonly known as: EPI-PEN Inject into the muscle once.  Reported on 07/15/2015   High Potency Iron 65 MG Tabs Take by mouth daily.   ibuprofen 800 MG tablet Commonly known as: ADVIL Take 1 tablet (800 mg total) by mouth every 8 (eight) hours as needed.   montelukast 10 MG tablet Commonly known as: SINGULAIR Take 10 mg by mouth daily as needed.   OVER THE COUNTER MEDICATION Vitamin d 3 187.5 mg plus vitamin k 200 mg 1 daily   OVER THE COUNTER MEDICATION Raw zinc 30 mg daily   oxyCODONE-acetaminophen 5-325 MG tablet Commonly known as: Percocet Take 1 tablet by mouth every 6 (six) hours as needed for severe pain.   PROBIOTIC ADVANCED PO Take  by mouth. 2 tabs daily   progesterone 100 MG capsule Commonly known as: PROMETRIUM Take 100 mg by mouth at bedtime.   triamcinolone ointment 0.1 % Commonly known as: KENALOG Apply 1 application topically as needed.   VITAMIN B 12 PO Take by mouth. 5000 mg daily   vitamin C 500 MG tablet Commonly known as: ASCORBIC ACID Take 500 mg by mouth daily.       Follow-up Information    Rande Brunt, MD. Go on 08/11/2020.   Specialty: Obstetrics and Gynecology Contact information: [address] [place] [place] [address] [phone]               Signed: Rande Brunt 07/28/2020, 8:20 AM

## 2020-07-27 NOTE — Brief Op Note (Addendum)
07/27/2020  9:56 AM  PATIENT:  Karen Frazier  40 y.o. female  PRE-OPERATIVE DIAGNOSIS:  Abnormal uterine bleeding  POST-OPERATIVE DIAGNOSIS: abnormal uterine bleeding  PROCEDURE:  Procedure(s): HYSTERECTOMY VAGINALw/ Bilateral salpingectomy (Bilateral) CYSTOSCOPY (N/A)  SURGEON:  Surgeon(s) and Role:    * Taam-Akelman, Griselda Miner, MD - Primary    * Carrington Clamp, MD - Assisting  ASSISTANTS: none   ANESTHESIA:   general  EBL: 50cc  BLOOD ADMINISTERED:none  DRAINS: none   LOCAL MEDICATIONS USED: 1% lidocaine with epinephrine   SPECIMEN:  Source of Specimen:  uterus, cervix,bilateral fallopian tubes  DISPOSITION OF SPECIMEN:  PATHOLOGY  COUNTS:  YES  TOURNIQUET:  * No tourniquets in log *  DICTATION: .Note written in EPIC  PLAN OF CARE: Discharge to home after PACU vs. 23 hour observation  PATIENT DISPOSITION:  PACU - hemodynamically stable.   Delay start of Pharmacological VTE agent (>24hrs) due to surgical blood loss or risk of bleeding: not applicable

## 2020-07-27 NOTE — Addendum Note (Signed)
Addendum  created 07/27/20 1201 by Francie Massing, CRNA   Intraprocedure Meds edited

## 2020-07-27 NOTE — Discharge Instructions (Signed)
Prescriptions Motrin 800mg  every 8 hours for pain Acetaminophen 650mg  every 6 hours for moderate pain Percocet (Oxycodone 5mg -acetaminophen 325mg ) every 4 hours for severe pain.  Make sure to not exceed acetaminophen 3000mg  every day.   Vaginal Hysterectomy, Care After The following information offers guidance on how to care for yourself after your procedure. Your health care provider may also give you more specific instructions. If you have problems or questions, contact your health care provider. What can I expect after the procedure? After the procedure, it is common to have:  Pain in the lower abdomen and vagina.  Vaginal bleeding and discharge for up to 1 week. You will need to use a sanitary pad after this procedure.  Difficulty having a bowel movement (constipation).  Temporary problems emptying the bladder.  Tiredness (fatigue).  Poor appetite.  Less interest in sex.  Feelings of sadness or other emotions. If your ovaries were also removed, it is also common to have symptoms of menopause, such as hot flashes, night sweats, and lack of sleep (insomnia). Follow these instructions at home: Medicines  Take over-the-counter and prescription medicines only as told by your health care provider.  Do not take aspirin or NSAIDs, such as ibuprofen. These medicines can cause bleeding.  Ask your health care provider if the medicine prescribed to you: ? Requires you to avoid driving or using machinery. ? Can cause constipation. You may need to take these actions to prevent or treat constipation:  Drink enough fluid to keep your urine pale yellow.  Take over-the-counter or prescription medicines.  Eat foods that are high in fiber, such as beans, whole grains, and fresh fruits and vegetables.  Limit foods that are high in fat and processed sugars, such as fried or sweet foods.   Activity  Rest as told by your health care provider.  Return to your normal activities as told by  your health care provider. Ask your health care provider what activities are safe for you  Avoid sitting for a long time without moving. Get up to take short walks every 1-2 hours. This is important to improve blood flow and breathing. Ask for help if you feel weak or unsteady.  Try to have someone home with you for 1-2 weeks to help you with everyday chores.  Do not lift anything that is heavier than 10 lb (4.5 kg), or the limit that you are told, until your health care provider says that it is safe.  If you were given a sedative during the procedure, it can affect you for several hours. Do not drive or operate machinery until your health care provider says that it is safe.   Lifestyle  Do not use any products that contain nicotine or tobacco. These products include cigarettes, chewing tobacco, and vaping devices, such as e-cigarettes. These can delay healing after surgery. If you need help quitting, ask your health care provider.  Do not drink alcohol until your health care provider approves. General instructions  Do not douche, use tampons, or have sex for at least 6 weeks, or as told by your health care provider.  If you struggle with physical or emotional changes after your procedure, speak with your health care provider or a therapist.  The stitches inside your vagina will dissolve over time and do not need to be taken out.  Do not take baths, swim, or use a hot tub until your health care provider approves. You may only be allowed to take showers for 2-3 weeks  Wear compression stockings as told by your health care provider. These stockings help to prevent blood clots and reduce swelling in your legs.  Keep all follow-up visits. This is important. Contact a health care provider if:  Your pain medicine is not helping.  You have a fever.  You have nausea or vomiting that does not go away.  You feel dizzy.  You have blood, pus, or a bad-smelling discharge from your vagina more  than 1 week after the procedure.  You continue to have trouble urinating 3-5 days after the procedure. Get help right away if:  You have severe pain in your abdomen or back.  You faint.  You have heavy vaginal bleeding and blood clots, soaking through a sanitary pad in less than 1 hour.  You have chest pain or shortness of breath.  You have pain, swelling, or redness in your leg. These symptoms may represent a serious problem that is an emergency. Do not wait to see if the symptoms will go away. Get medical help right away. Call your local emergency services (911 in the [place] Do not drive yourself to the hospital. Summary  After the procedure, it is common to have pain, vaginal bleeding, constipation, temporary problems emptying your bladder, and feelings of sadness or other emotions.  Take over-the-counter and prescription medicines only as told by your health care provider.  Rest as told by your health care provider. Return to your normal activities as told by your health care provider.  Contact a health care provider if your pain medicine is not helping, or you have a fever, dizziness, or trouble urinating several days after the procedure.  Get help right away if you have severe pain in your abdomen or back, or if you faint, have heavy bleeding, or have chest pain or shortness of breath. This information is not intended to replace advice given to you by your health care provider. Make sure you discuss any questions you have with your health care provider. Document Revised: 02/05/2020 Document Reviewed: 02/05/2020 Elsevier Patient Education  2021 [employer].

## 2020-07-28 LAB — SURGICAL PATHOLOGY

## 2020-07-29 ENCOUNTER — Encounter (HOSPITAL_BASED_OUTPATIENT_CLINIC_OR_DEPARTMENT_OTHER): Payer: Self-pay | Admitting: Obstetrics & Gynecology

## 2020-08-03 DIAGNOSIS — J3081 Allergic rhinitis due to animal (cat) (dog) hair and dander: Secondary | ICD-10-CM | POA: Diagnosis not present

## 2020-08-03 DIAGNOSIS — J301 Allergic rhinitis due to pollen: Secondary | ICD-10-CM | POA: Diagnosis not present

## 2020-08-03 DIAGNOSIS — J3089 Other allergic rhinitis: Secondary | ICD-10-CM | POA: Diagnosis not present

## 2020-08-17 DIAGNOSIS — J3081 Allergic rhinitis due to animal (cat) (dog) hair and dander: Secondary | ICD-10-CM | POA: Diagnosis not present

## 2020-08-17 DIAGNOSIS — J3089 Other allergic rhinitis: Secondary | ICD-10-CM | POA: Diagnosis not present

## 2020-08-17 DIAGNOSIS — J301 Allergic rhinitis due to pollen: Secondary | ICD-10-CM | POA: Diagnosis not present

## 2020-08-19 DIAGNOSIS — J301 Allergic rhinitis due to pollen: Secondary | ICD-10-CM | POA: Diagnosis not present

## 2020-08-19 DIAGNOSIS — J3089 Other allergic rhinitis: Secondary | ICD-10-CM | POA: Diagnosis not present

## 2020-08-19 DIAGNOSIS — J3081 Allergic rhinitis due to animal (cat) (dog) hair and dander: Secondary | ICD-10-CM | POA: Diagnosis not present

## 2020-08-19 DIAGNOSIS — L209 Atopic dermatitis, unspecified: Secondary | ICD-10-CM | POA: Diagnosis not present

## 2020-08-31 DIAGNOSIS — J3081 Allergic rhinitis due to animal (cat) (dog) hair and dander: Secondary | ICD-10-CM | POA: Diagnosis not present

## 2020-08-31 DIAGNOSIS — J3089 Other allergic rhinitis: Secondary | ICD-10-CM | POA: Diagnosis not present

## 2020-08-31 DIAGNOSIS — J301 Allergic rhinitis due to pollen: Secondary | ICD-10-CM | POA: Diagnosis not present

## 2020-09-07 DIAGNOSIS — J3089 Other allergic rhinitis: Secondary | ICD-10-CM | POA: Diagnosis not present

## 2020-09-07 DIAGNOSIS — J301 Allergic rhinitis due to pollen: Secondary | ICD-10-CM | POA: Diagnosis not present

## 2020-09-07 DIAGNOSIS — J3081 Allergic rhinitis due to animal (cat) (dog) hair and dander: Secondary | ICD-10-CM | POA: Diagnosis not present

## 2020-09-14 DIAGNOSIS — J3089 Other allergic rhinitis: Secondary | ICD-10-CM | POA: Diagnosis not present

## 2020-09-14 DIAGNOSIS — J301 Allergic rhinitis due to pollen: Secondary | ICD-10-CM | POA: Diagnosis not present

## 2020-09-14 DIAGNOSIS — J3081 Allergic rhinitis due to animal (cat) (dog) hair and dander: Secondary | ICD-10-CM | POA: Diagnosis not present

## 2020-09-21 DIAGNOSIS — J301 Allergic rhinitis due to pollen: Secondary | ICD-10-CM | POA: Diagnosis not present

## 2020-09-21 DIAGNOSIS — J3081 Allergic rhinitis due to animal (cat) (dog) hair and dander: Secondary | ICD-10-CM | POA: Diagnosis not present

## 2020-09-21 DIAGNOSIS — J3089 Other allergic rhinitis: Secondary | ICD-10-CM | POA: Diagnosis not present

## 2020-09-25 DIAGNOSIS — J3081 Allergic rhinitis due to animal (cat) (dog) hair and dander: Secondary | ICD-10-CM | POA: Diagnosis not present

## 2020-09-25 DIAGNOSIS — J3089 Other allergic rhinitis: Secondary | ICD-10-CM | POA: Diagnosis not present

## 2020-09-25 DIAGNOSIS — J301 Allergic rhinitis due to pollen: Secondary | ICD-10-CM | POA: Diagnosis not present

## 2020-09-26 DIAGNOSIS — J3081 Allergic rhinitis due to animal (cat) (dog) hair and dander: Secondary | ICD-10-CM | POA: Diagnosis not present

## 2020-09-26 DIAGNOSIS — J301 Allergic rhinitis due to pollen: Secondary | ICD-10-CM | POA: Diagnosis not present

## 2020-09-26 DIAGNOSIS — J3089 Other allergic rhinitis: Secondary | ICD-10-CM | POA: Diagnosis not present

## 2020-09-28 DIAGNOSIS — J3089 Other allergic rhinitis: Secondary | ICD-10-CM | POA: Diagnosis not present

## 2020-09-28 DIAGNOSIS — J3081 Allergic rhinitis due to animal (cat) (dog) hair and dander: Secondary | ICD-10-CM | POA: Diagnosis not present

## 2020-09-28 DIAGNOSIS — J301 Allergic rhinitis due to pollen: Secondary | ICD-10-CM | POA: Diagnosis not present

## 2020-10-03 ENCOUNTER — Other Ambulatory Visit: Payer: Self-pay | Admitting: Physician Assistant

## 2020-10-03 DIAGNOSIS — Z0000 Encounter for general adult medical examination without abnormal findings: Secondary | ICD-10-CM

## 2020-10-06 ENCOUNTER — Other Ambulatory Visit: Payer: Self-pay | Admitting: Physician Assistant

## 2020-10-06 DIAGNOSIS — Z0000 Encounter for general adult medical examination without abnormal findings: Secondary | ICD-10-CM

## 2020-10-10 ENCOUNTER — Other Ambulatory Visit: Payer: Self-pay

## 2020-10-10 ENCOUNTER — Other Ambulatory Visit: Payer: BC Managed Care – PPO

## 2020-10-10 DIAGNOSIS — Z0000 Encounter for general adult medical examination without abnormal findings: Secondary | ICD-10-CM

## 2020-10-11 LAB — LIPID PANEL
Chol/HDL Ratio: 2.9 ratio (ref 0.0–4.4)
Cholesterol, Total: 223 mg/dL — ABNORMAL HIGH (ref 100–199)
HDL: 77 mg/dL (ref >39)
LDL Chol Calc (NIH): 130 mg/dL — ABNORMAL HIGH (ref 0–99)
Triglycerides: 94 mg/dL (ref 0–149)
VLDL Cholesterol Cal: 16 mg/dL (ref 5–40)

## 2020-10-11 LAB — CBC
Hematocrit: 38.2 % (ref 34.0–46.6)
Hemoglobin: 12.9 g/dL (ref 11.1–15.9)
MCH: 31.5 pg (ref 26.6–33.0)
MCHC: 33.8 g/dL (ref 31.5–35.7)
MCV: 93 fL (ref 79–97)
Platelets: 317 10*3/uL (ref 150–450)
RBC: 4.09 x10E6/uL (ref 3.77–5.28)
RDW: 12.9 % (ref 11.7–15.4)
WBC: 7.2 10*3/uL (ref 3.4–10.8)

## 2020-10-11 LAB — TSH: TSH: 2.8 u[IU]/mL (ref 0.450–4.500)

## 2020-10-11 LAB — HEMOGLOBIN A1C
Est. average glucose Bld gHb Est-mCnc: 111 mg/dL
Hgb A1c MFr Bld: 5.5 % (ref 4.8–5.6)

## 2020-10-11 LAB — COMPREHENSIVE METABOLIC PANEL
ALT: 16 IU/L (ref 0–32)
AST: 15 IU/L (ref 0–40)
Albumin/Globulin Ratio: 2 (ref 1.2–2.2)
Albumin: 4.8 g/dL (ref 3.8–4.8)
Alkaline Phosphatase: 67 IU/L (ref 44–121)
BUN/Creatinine Ratio: 19 (ref 9–23)
BUN: 19 mg/dL (ref 6–24)
Bilirubin Total: 0.3 mg/dL (ref 0.0–1.2)
CO2: 18 mmol/L — ABNORMAL LOW (ref 20–29)
Calcium: 9.7 mg/dL (ref 8.7–10.2)
Chloride: 103 mmol/L (ref 96–106)
Creatinine, Ser: 0.98 mg/dL (ref 0.57–1.00)
Globulin, Total: 2.4 g/dL (ref 1.5–4.5)
Glucose: 93 mg/dL (ref 65–99)
Potassium: 4.9 mmol/L (ref 3.5–5.2)
Sodium: 136 mmol/L (ref 134–144)
Total Protein: 7.2 g/dL (ref 6.0–8.5)
eGFR: 75 mL/min/{1.73_m2} (ref >59)

## 2020-10-12 DIAGNOSIS — J301 Allergic rhinitis due to pollen: Secondary | ICD-10-CM | POA: Diagnosis not present

## 2020-10-12 DIAGNOSIS — J3089 Other allergic rhinitis: Secondary | ICD-10-CM | POA: Diagnosis not present

## 2020-10-12 DIAGNOSIS — J3081 Allergic rhinitis due to animal (cat) (dog) hair and dander: Secondary | ICD-10-CM | POA: Diagnosis not present

## 2020-10-13 ENCOUNTER — Encounter: Payer: BC Managed Care – PPO | Admitting: Physician Assistant

## 2020-10-19 DIAGNOSIS — J3089 Other allergic rhinitis: Secondary | ICD-10-CM | POA: Diagnosis not present

## 2020-10-19 DIAGNOSIS — J3081 Allergic rhinitis due to animal (cat) (dog) hair and dander: Secondary | ICD-10-CM | POA: Diagnosis not present

## 2020-10-19 DIAGNOSIS — J301 Allergic rhinitis due to pollen: Secondary | ICD-10-CM | POA: Diagnosis not present

## 2020-11-02 DIAGNOSIS — J3089 Other allergic rhinitis: Secondary | ICD-10-CM | POA: Diagnosis not present

## 2020-11-02 DIAGNOSIS — J301 Allergic rhinitis due to pollen: Secondary | ICD-10-CM | POA: Diagnosis not present

## 2020-11-02 DIAGNOSIS — J3081 Allergic rhinitis due to animal (cat) (dog) hair and dander: Secondary | ICD-10-CM | POA: Diagnosis not present

## 2020-11-09 DIAGNOSIS — J3081 Allergic rhinitis due to animal (cat) (dog) hair and dander: Secondary | ICD-10-CM | POA: Diagnosis not present

## 2020-11-09 DIAGNOSIS — J3089 Other allergic rhinitis: Secondary | ICD-10-CM | POA: Diagnosis not present

## 2020-11-09 DIAGNOSIS — J301 Allergic rhinitis due to pollen: Secondary | ICD-10-CM | POA: Diagnosis not present

## 2020-11-23 DIAGNOSIS — J3089 Other allergic rhinitis: Secondary | ICD-10-CM | POA: Diagnosis not present

## 2020-11-23 DIAGNOSIS — J301 Allergic rhinitis due to pollen: Secondary | ICD-10-CM | POA: Diagnosis not present

## 2020-11-23 DIAGNOSIS — J3081 Allergic rhinitis due to animal (cat) (dog) hair and dander: Secondary | ICD-10-CM | POA: Diagnosis not present

## 2020-11-30 DIAGNOSIS — J3081 Allergic rhinitis due to animal (cat) (dog) hair and dander: Secondary | ICD-10-CM | POA: Diagnosis not present

## 2020-11-30 DIAGNOSIS — J3089 Other allergic rhinitis: Secondary | ICD-10-CM | POA: Diagnosis not present

## 2020-11-30 DIAGNOSIS — J301 Allergic rhinitis due to pollen: Secondary | ICD-10-CM | POA: Diagnosis not present

## 2020-12-21 DIAGNOSIS — J3081 Allergic rhinitis due to animal (cat) (dog) hair and dander: Secondary | ICD-10-CM | POA: Diagnosis not present

## 2020-12-21 DIAGNOSIS — J3089 Other allergic rhinitis: Secondary | ICD-10-CM | POA: Diagnosis not present

## 2020-12-21 DIAGNOSIS — J301 Allergic rhinitis due to pollen: Secondary | ICD-10-CM | POA: Diagnosis not present

## 2021-01-04 DIAGNOSIS — J3081 Allergic rhinitis due to animal (cat) (dog) hair and dander: Secondary | ICD-10-CM | POA: Diagnosis not present

## 2021-01-04 DIAGNOSIS — J3089 Other allergic rhinitis: Secondary | ICD-10-CM | POA: Diagnosis not present

## 2021-01-04 DIAGNOSIS — J301 Allergic rhinitis due to pollen: Secondary | ICD-10-CM | POA: Diagnosis not present

## 2021-01-11 DIAGNOSIS — J301 Allergic rhinitis due to pollen: Secondary | ICD-10-CM | POA: Diagnosis not present

## 2021-01-11 DIAGNOSIS — J3089 Other allergic rhinitis: Secondary | ICD-10-CM | POA: Diagnosis not present

## 2021-01-11 DIAGNOSIS — J3081 Allergic rhinitis due to animal (cat) (dog) hair and dander: Secondary | ICD-10-CM | POA: Diagnosis not present

## 2021-01-18 ENCOUNTER — Other Ambulatory Visit: Payer: Self-pay

## 2021-01-18 ENCOUNTER — Ambulatory Visit (INDEPENDENT_AMBULATORY_CARE_PROVIDER_SITE_OTHER): Payer: BC Managed Care – PPO | Admitting: Physician Assistant

## 2021-01-18 ENCOUNTER — Encounter: Payer: Self-pay | Admitting: Physician Assistant

## 2021-01-18 VITALS — BP 123/86 | HR 72 | Temp 98.4°F | Ht 64.0 in | Wt 170.0 lb

## 2021-01-18 DIAGNOSIS — K429 Umbilical hernia without obstruction or gangrene: Secondary | ICD-10-CM | POA: Diagnosis not present

## 2021-01-18 NOTE — Progress Notes (Signed)
Acute Office Visit  Subjective:    Patient ID: Karen Frazier, female    DOB: 02-08-1981, 40 y.o.   MRN: [id number]  Chief Complaint  Patient presents with   Acute Visit    Hernia    HPI Patient is in today for c/o umbilical hernia which has grew in size and is tender. Patient does strenuous physical activity which involves heavy lifting. Denies discoloration, n/v or severe pain.    Past Medical History:  Diagnosis Date   Abnormal uterine bleeding (AUB)    Acute superficial venous thrombosis of right lower extremity 2016   Anemia    Asthma    Eczema    Environmental allergies    GERD (gastroesophageal reflux disease)    History of chicken pox    Seasonal allergies     Past Surgical History:  Procedure Laterality Date   CYSTOSCOPY N/A 07/27/2020   Procedure: CYSTOSCOPY;  Surgeon: Jonelle Sidle, MD;  Location: [place];  Service: Gynecology;  Laterality: N/A;   NO PAST SURGERIES     VAGINAL HYSTERECTOMY Bilateral 07/27/2020   Procedure: HYSTERECTOMY VAGINALw/ Bilateral salpingectomy;  Surgeon: Jonelle Sidle, MD;  Location: [place];  Service: Gynecology;  Laterality: Bilateral;    Family History  Problem Relation Age of Onset   Hyperlipidemia Father        Living   Hypertension Father    Diabetes Mother        Living   Hypertension Mother    Allergy (severe) Mother        Alpha-gal   Other Mother        IVP Dye   Heart disease Maternal Grandfather    COPD Paternal Grandfather    Healthy Sister        x1   Allergies Daughter        x1   Healthy Daughter        x2   Diabetes Maternal Aunt    Hyperlipidemia Maternal Aunt    Hypertension Maternal Aunt    Alcohol abuse Maternal Uncle    Heart attack Maternal Uncle    Hyperlipidemia Maternal Uncle    Hypertension Maternal Uncle     Social History   Socioeconomic History   Marital status: Married    Spouse name: Not on file   Number of children: Not on  file   Years of education: Not on file   Highest education level: Not on file  Occupational History   Occupation: farmer  Tobacco Use   Smoking status: Never   Smokeless tobacco: Never  Vaping Use   Vaping Use: Never used  Substance and Sexual Activity   Alcohol use: Yes    Alcohol/week: 1.0 standard drink    Types: 1 Standard drinks or equivalent per week    Comment: occ   Drug use: No   Sexual activity: Yes    Partners: Male    Birth control/protection: Surgical  Other Topics Concern   Not on file  Social History Narrative   Not on file   Social Determinants of Health   Financial Resource Strain: Not on file  Food Insecurity: Not on file  Transportation Needs: Not on file  Physical Activity: Not on file  Stress: Not on file  Social Connections: Not on file  Intimate Partner Violence: Not on file    Outpatient Medications Prior to Visit  Medication Sig Dispense Refill   albuterol (PROVENTIL HFA;VENTOLIN HFA) 108 (90 BASE) MCG/ACT inhaler Inhale  1-2 puffs into the lungs every 6 (six) hours as needed for wheezing or shortness of breath.     Cyanocobalamin (VITAMIN B 12 PO) Take by mouth. 5000 mg daily     EPINEPHrine 0.3 mg/0.3 mL IJ SOAJ injection Inject into the muscle once. Reported on 07/15/2015     Ferrous Sulfate Dried (HIGH POTENCY IRON) 65 MG TABS Take by mouth daily.     ibuprofen (ADVIL) 800 MG tablet Take 1 tablet (800 mg total) by mouth every 8 (eight) hours as needed. 30 tablet 0   montelukast (SINGULAIR) 10 MG tablet Take 10 mg by mouth daily as needed.     OVER THE COUNTER MEDICATION Vitamin d 3 187.5 mg plus vitamin k 200 mg 1 daily     OVER THE COUNTER MEDICATION Raw zinc 30 mg daily     Probiotic Product (PROBIOTIC ADVANCED PO) Take by mouth. 2 tabs daily     triamcinolone ointment (KENALOG) 0.1 % Apply 1 application topically as needed.  3   vitamin C (ASCORBIC ACID) 500 MG tablet Take 500 mg by mouth daily.     docusate sodium (COLACE) 50 MG capsule  Take 1 capsule (50 mg total) by mouth 2 (two) times daily. 30 capsule 0   oxyCODONE-acetaminophen (PERCOCET) 5-325 MG tablet Take 1 tablet by mouth every 6 (six) hours as needed for severe pain. 15 tablet 0   progesterone (PROMETRIUM) 100 MG capsule Take 100 mg by mouth at bedtime.     No facility-administered medications prior to visit.    Allergies  Allergen Reactions   Gelatin Anaphylaxis    Nothing Made In Gel [caps]   Other Anaphylaxis    ALL Mammal Meat Products    Review of Systems Review of Systems:  A fourteen system review of systems was performed and found to be positive as per HPI.     Objective:    Physical Exam General:  Well Developed, well nourished, appropriate for stated age.  Neuro:  Alert and oriented,  extra-ocular muscles intact  HEENT:  Normocephalic, atraumatic, neck supple, no carotid bruits appreciated  Skin:  no gross rash, warm, pink. Abdomen: Non-distended, +tender mass at umbilicus, NBS  Cardiac:  RRR, S1 S2 Respiratory:  CTA B/L, Not using accessory muscles, speaking in full sentences- unlabored. Vascular:  Ext warm, no cyanosis apprec.; cap RF less 2 sec. Psych:  No HI/SI, judgement and insight good, Euthymic mood. Full Affect.  BP 123/86   Pulse 72   Temp 98.4 F (36.9 C)   Ht _0  (1.626 m)   Wt 170 lb (77.1 kg)   LMP 07/20/2020   SpO2 98%   BMI 29.18 kg/m  Wt Readings from Last 3 Encounters:  01/18/21 170 lb (77.1 kg)  07/27/20 180 lb 6.4 oz (81.8 kg)  07/25/20 181 lb 6 oz (82.3 kg)    Health Maintenance Due  Topic Date Due   Hepatitis C Screening  Never done   COVID-19 Vaccine (2 - Pfizer series) 09/09/2020   INFLUENZA VACCINE  01/16/2021    There are no preventive care reminders to display for this patient.   Lab Results  Component Value Date   TSH 2.800 10/10/2020   Lab Results  Component Value Date   WBC 7.2 10/10/2020   HGB 12.9 10/10/2020   HCT 38.2 10/10/2020   MCV 93 10/10/2020   PLT 317 10/10/2020    Lab Results  Component Value Date   NA 136 10/10/2020   K 4.9 10/10/2020  CO2 18 (L) 10/10/2020   GLUCOSE 93 10/10/2020   BUN 19 10/10/2020   CREATININE 0.98 10/10/2020   BILITOT 0.3 10/10/2020   ALKPHOS 67 10/10/2020   AST 15 10/10/2020   ALT 16 10/10/2020   PROT 7.2 10/10/2020   ALBUMIN 4.8 10/10/2020   CALCIUM 9.7 10/10/2020   ANIONGAP 11 07/25/2020   EGFR 75 10/10/2020   GFR 85.58 07/15/2015   Lab Results  Component Value Date   CHOL 223 (H) 10/10/2020   Lab Results  Component Value Date   HDL 77 10/10/2020   Lab Results  Component Value Date   LDLCALC 130 (H) 10/10/2020   Lab Results  Component Value Date   TRIG 94 10/10/2020   Lab Results  Component Value Date   CHOLHDL 2.9 10/10/2020   Lab Results  Component Value Date   HGBA1C 5.5 10/10/2020       Assessment & Plan:   Problem List Items Addressed This Visit   None Visit Diagnoses     Umbilical hernia without obstruction and without gangrene    -  Primary   Relevant Orders   Ambulatory referral to General Surgery   US Abdomen Limited      Umbilical hernia without obstruction and without gangrene: -Will place imaging order for abd [place] and referral to general surgery. -Red flag signs and symptoms discussed, patient aware to seek immediate medical care. -Recommend to avoid heavy lifting.   No orders of the defined types were placed in this encounter.   Note:  This note was prepared with assistance of Dragon voice recognition software. Occasional wrong-word or sound-a-like substitutions may have occurred due to the inherent limitations of voice recognition software.  Lorrene Reid, PA-C

## 2021-01-18 NOTE — Patient Instructions (Signed)
Hernia, Adult   A hernia happens when tissue inside your body pushes out through a weak spot in your belly muscles (abdominal wall). This makes a round lump (bulge). The lump may be: In a scar from surgery that was done in your belly (incisional hernia). Near your belly button (umbilical hernia). In your groin (inguinal hernia). Your groin is the area where your leg meets your lower belly (abdomen). This kind of hernia could also be: In your scrotum, if you are female. In folds of skin around your vagina, if you are female. In your upper thigh (femoral hernia). Inside your belly (hiatal hernia). This happens when your stomach slides above the muscle between your belly and your chest (diaphragm). If your hernia is small and it does not cause pain, you may not need treatment. If your hernia is large or it causes pain, you may need surgery. Follow these instructions at home: Activity Avoid stretching or overusing (straining) the muscles near your hernia. Straining can happen when you: Lift something heavy. Poop (have a bowel movement). Do not lift anything that is heavier than 10 lb (4.5 kg), or the limit that you are told, until your doctor says that it is safe. Use the strength of your legs when you lift something heavy. Do not use only your back muscles to lift. General instructions Do these things if told by your doctor so you do not have trouble pooping (constipation): Drink enough fluid to keep your pee (urine) pale yellow. Eat foods that are high in fiber. These include fresh fruits and vegetables, whole grains, and beans. Limit foods that are high in fat and processed sugars. These include foods that are fried or sweet. Take medicine for trouble pooping. When you cough, try to cough gently. You may try to push your hernia in by very gently pressing on it when you are lying down. Do not try to force the bulge back in if it will not push in easily. If you are overweight, work with your  doctor to lose weight safely. Do not use any products that have nicotine or tobacco in them. These include cigarettes and e-cigarettes. If you need help quitting, ask your doctor. If you will be having surgery (hernia repair), watch your hernia for changes in shape, size, or color. Tell your doctor if you see any changes. Take over-the-counter and prescription medicines only as told by your doctor. Keep all follow-up visits as told by your doctor. Contact a doctor if: You get new pain, swelling, or redness near your hernia. You poop fewer times in a week than normal. You have trouble pooping. You have poop (stool) that is more dry than normal. You have poop that is harder or larger than normal. Get help right away if: You have a fever. You have belly pain that gets worse. You feel sick to your stomach (nauseous). You throw up (vomit). Your hernia cannot be pushed in by very gently pressing on it when you are lying down. Do not try to force the bulge back in if it will not push in easily. Your hernia: Changes in shape or size. Changes color. Feels hard or it hurts when you touch it. These symptoms may represent a serious problem that is an emergency. Do not wait to see if the symptoms will go away. Get medical help right away. Call your local emergency services (911 in the [place] Summary A hernia happens when tissue inside your body pushes out through a weak spot in the   belly muscles. This creates a bulge. If your hernia is small and it does not hurt, you may not need treatment. If your hernia is large or it hurts, you may need surgery. If you will be having surgery, watch your hernia for changes in shape, size, or color. Tell your doctor about any changes. This information is not intended to replace advice given to you by your health care provider. Make sure you discuss any questions you have with your health care provider. Document Revised: 09/25/2018 Document Reviewed:  03/06/2017 Elsevier Patient Education  2021 Elsevier Inc.  

## 2021-01-23 ENCOUNTER — Ambulatory Visit
Admission: RE | Admit: 2021-01-23 | Discharge: 2021-01-23 | Disposition: A | Discharge status: Home / Self Care | Payer: BC Managed Care – PPO | Source: Ambulatory Visit | Attending: Physician Assistant | Admitting: Physician Assistant

## 2021-01-23 ENCOUNTER — Other Ambulatory Visit: Payer: Self-pay

## 2021-01-23 DIAGNOSIS — R1900 Intra-abdominal and pelvic swelling, mass and lump, unspecified site: Secondary | ICD-10-CM | POA: Diagnosis not present

## 2021-01-23 DIAGNOSIS — K429 Umbilical hernia without obstruction or gangrene: Secondary | ICD-10-CM

## 2021-01-25 DIAGNOSIS — J301 Allergic rhinitis due to pollen: Secondary | ICD-10-CM | POA: Diagnosis not present

## 2021-01-25 DIAGNOSIS — J3089 Other allergic rhinitis: Secondary | ICD-10-CM | POA: Diagnosis not present

## 2021-01-25 DIAGNOSIS — J3081 Allergic rhinitis due to animal (cat) (dog) hair and dander: Secondary | ICD-10-CM | POA: Diagnosis not present

## 2021-01-30 ENCOUNTER — Encounter: Payer: Self-pay | Admitting: Physician Assistant

## 2021-02-01 DIAGNOSIS — J3081 Allergic rhinitis due to animal (cat) (dog) hair and dander: Secondary | ICD-10-CM | POA: Diagnosis not present

## 2021-02-01 DIAGNOSIS — J301 Allergic rhinitis due to pollen: Secondary | ICD-10-CM | POA: Diagnosis not present

## 2021-02-01 DIAGNOSIS — J3089 Other allergic rhinitis: Secondary | ICD-10-CM | POA: Diagnosis not present

## 2021-02-08 DIAGNOSIS — J301 Allergic rhinitis due to pollen: Secondary | ICD-10-CM | POA: Diagnosis not present

## 2021-02-08 DIAGNOSIS — J3081 Allergic rhinitis due to animal (cat) (dog) hair and dander: Secondary | ICD-10-CM | POA: Diagnosis not present

## 2021-02-08 DIAGNOSIS — J3089 Other allergic rhinitis: Secondary | ICD-10-CM | POA: Diagnosis not present

## 2021-02-15 DIAGNOSIS — J3089 Other allergic rhinitis: Secondary | ICD-10-CM | POA: Diagnosis not present

## 2021-02-15 DIAGNOSIS — J301 Allergic rhinitis due to pollen: Secondary | ICD-10-CM | POA: Diagnosis not present

## 2021-02-15 DIAGNOSIS — J3081 Allergic rhinitis due to animal (cat) (dog) hair and dander: Secondary | ICD-10-CM | POA: Diagnosis not present

## 2021-03-01 DIAGNOSIS — J3089 Other allergic rhinitis: Secondary | ICD-10-CM | POA: Diagnosis not present

## 2021-03-01 DIAGNOSIS — J301 Allergic rhinitis due to pollen: Secondary | ICD-10-CM | POA: Diagnosis not present

## 2021-03-01 DIAGNOSIS — J3081 Allergic rhinitis due to animal (cat) (dog) hair and dander: Secondary | ICD-10-CM | POA: Diagnosis not present

## 2021-03-15 DIAGNOSIS — J3081 Allergic rhinitis due to animal (cat) (dog) hair and dander: Secondary | ICD-10-CM | POA: Diagnosis not present

## 2021-03-15 DIAGNOSIS — J301 Allergic rhinitis due to pollen: Secondary | ICD-10-CM | POA: Diagnosis not present

## 2021-03-15 DIAGNOSIS — J3089 Other allergic rhinitis: Secondary | ICD-10-CM | POA: Diagnosis not present

## 2021-03-28 DIAGNOSIS — J3089 Other allergic rhinitis: Secondary | ICD-10-CM | POA: Diagnosis not present

## 2021-03-28 DIAGNOSIS — J3081 Allergic rhinitis due to animal (cat) (dog) hair and dander: Secondary | ICD-10-CM | POA: Diagnosis not present

## 2021-03-28 DIAGNOSIS — J301 Allergic rhinitis due to pollen: Secondary | ICD-10-CM | POA: Diagnosis not present

## 2021-04-19 DIAGNOSIS — J301 Allergic rhinitis due to pollen: Secondary | ICD-10-CM | POA: Diagnosis not present

## 2021-04-19 DIAGNOSIS — J3089 Other allergic rhinitis: Secondary | ICD-10-CM | POA: Diagnosis not present

## 2021-04-19 DIAGNOSIS — J3081 Allergic rhinitis due to animal (cat) (dog) hair and dander: Secondary | ICD-10-CM | POA: Diagnosis not present

## 2021-04-28 DIAGNOSIS — M25511 Pain in right shoulder: Secondary | ICD-10-CM | POA: Diagnosis not present

## 2021-05-02 DIAGNOSIS — J3089 Other allergic rhinitis: Secondary | ICD-10-CM | POA: Diagnosis not present

## 2021-05-02 DIAGNOSIS — J3081 Allergic rhinitis due to animal (cat) (dog) hair and dander: Secondary | ICD-10-CM | POA: Diagnosis not present

## 2021-05-02 DIAGNOSIS — J301 Allergic rhinitis due to pollen: Secondary | ICD-10-CM | POA: Diagnosis not present

## 2021-05-02 DIAGNOSIS — M25511 Pain in right shoulder: Secondary | ICD-10-CM | POA: Diagnosis not present

## 2021-05-04 DIAGNOSIS — M7541 Impingement syndrome of right shoulder: Secondary | ICD-10-CM | POA: Diagnosis not present

## 2021-05-16 DIAGNOSIS — J301 Allergic rhinitis due to pollen: Secondary | ICD-10-CM | POA: Diagnosis not present

## 2021-05-16 DIAGNOSIS — J3089 Other allergic rhinitis: Secondary | ICD-10-CM | POA: Diagnosis not present

## 2021-05-16 DIAGNOSIS — J3081 Allergic rhinitis due to animal (cat) (dog) hair and dander: Secondary | ICD-10-CM | POA: Diagnosis not present

## 2021-05-30 DIAGNOSIS — J3089 Other allergic rhinitis: Secondary | ICD-10-CM | POA: Diagnosis not present

## 2021-05-30 DIAGNOSIS — J3081 Allergic rhinitis due to animal (cat) (dog) hair and dander: Secondary | ICD-10-CM | POA: Diagnosis not present

## 2021-05-30 DIAGNOSIS — J301 Allergic rhinitis due to pollen: Secondary | ICD-10-CM | POA: Diagnosis not present

## 2021-06-06 DIAGNOSIS — J3089 Other allergic rhinitis: Secondary | ICD-10-CM | POA: Diagnosis not present

## 2021-06-06 DIAGNOSIS — J3081 Allergic rhinitis due to animal (cat) (dog) hair and dander: Secondary | ICD-10-CM | POA: Diagnosis not present

## 2021-06-06 DIAGNOSIS — J301 Allergic rhinitis due to pollen: Secondary | ICD-10-CM | POA: Diagnosis not present

## 2021-06-20 DIAGNOSIS — J3089 Other allergic rhinitis: Secondary | ICD-10-CM | POA: Diagnosis not present

## 2021-06-20 DIAGNOSIS — J3081 Allergic rhinitis due to animal (cat) (dog) hair and dander: Secondary | ICD-10-CM | POA: Diagnosis not present

## 2021-06-20 DIAGNOSIS — J301 Allergic rhinitis due to pollen: Secondary | ICD-10-CM | POA: Diagnosis not present

## 2021-07-03 ENCOUNTER — Other Ambulatory Visit: Payer: BC Managed Care – PPO

## 2021-07-03 ENCOUNTER — Other Ambulatory Visit: Payer: Self-pay

## 2021-07-03 DIAGNOSIS — E559 Vitamin D deficiency, unspecified: Secondary | ICD-10-CM | POA: Diagnosis not present

## 2021-07-03 DIAGNOSIS — Z0000 Encounter for general adult medical examination without abnormal findings: Secondary | ICD-10-CM | POA: Diagnosis not present

## 2021-07-03 DIAGNOSIS — Z1329 Encounter for screening for other suspected endocrine disorder: Secondary | ICD-10-CM | POA: Diagnosis not present

## 2021-07-03 DIAGNOSIS — Z1321 Encounter for screening for nutritional disorder: Secondary | ICD-10-CM

## 2021-07-03 DIAGNOSIS — E785 Hyperlipidemia, unspecified: Secondary | ICD-10-CM | POA: Diagnosis not present

## 2021-07-03 DIAGNOSIS — Z130 Encounter for screening for diseases of the blood and blood-forming organs and certain disorders involving the immune mechanism: Secondary | ICD-10-CM

## 2021-07-03 DIAGNOSIS — R739 Hyperglycemia, unspecified: Secondary | ICD-10-CM | POA: Diagnosis not present

## 2021-07-04 DIAGNOSIS — J301 Allergic rhinitis due to pollen: Secondary | ICD-10-CM | POA: Diagnosis not present

## 2021-07-04 DIAGNOSIS — J3081 Allergic rhinitis due to animal (cat) (dog) hair and dander: Secondary | ICD-10-CM | POA: Diagnosis not present

## 2021-07-04 DIAGNOSIS — J3089 Other allergic rhinitis: Secondary | ICD-10-CM | POA: Diagnosis not present

## 2021-07-04 LAB — COMPREHENSIVE METABOLIC PANEL
ALT: 16 IU/L (ref 0–32)
AST: 17 IU/L (ref 0–40)
Albumin/Globulin Ratio: 2.4 — ABNORMAL HIGH (ref 1.2–2.2)
Albumin: 5.1 g/dL — ABNORMAL HIGH (ref 3.8–4.8)
Alkaline Phosphatase: 55 IU/L (ref 44–121)
BUN/Creatinine Ratio: 13 (ref 9–23)
BUN: 10 mg/dL (ref 6–24)
Bilirubin Total: 0.5 mg/dL (ref 0.0–1.2)
CO2: 21 mmol/L (ref 20–29)
Calcium: 9.6 mg/dL (ref 8.7–10.2)
Chloride: 104 mmol/L (ref 96–106)
Creatinine, Ser: 0.78 mg/dL (ref 0.57–1.00)
Globulin, Total: 2.1 g/dL (ref 1.5–4.5)
Glucose: 72 mg/dL (ref 70–99)
Potassium: 3.7 mmol/L (ref 3.5–5.2)
Sodium: 141 mmol/L (ref 134–144)
Total Protein: 7.2 g/dL (ref 6.0–8.5)
eGFR: 98 mL/min/{1.73_m2} (ref >59)

## 2021-07-04 LAB — CBC WITH DIFFERENTIAL/PLATELET
Basophils Absolute: 0 10*3/uL (ref 0.0–0.2)
Basos: 1 %
EOS (ABSOLUTE): 0.1 10*3/uL (ref 0.0–0.4)
Eos: 1 %
Hematocrit: 36.5 % (ref 34.0–46.6)
Hemoglobin: 12.4 g/dL (ref 11.1–15.9)
Immature Grans (Abs): 0 10*3/uL (ref 0.0–0.1)
Immature Granulocytes: 0 %
Lymphocytes Absolute: 2.5 10*3/uL (ref 0.7–3.1)
Lymphs: 32 %
MCH: 31.2 pg (ref 26.6–33.0)
MCHC: 34 g/dL (ref 31.5–35.7)
MCV: 92 fL (ref 79–97)
Monocytes Absolute: 0.6 10*3/uL (ref 0.1–0.9)
Monocytes: 8 %
Neutrophils Absolute: 4.7 10*3/uL (ref 1.4–7.0)
Neutrophils: 58 %
Platelets: 380 10*3/uL (ref 150–450)
RBC: 3.97 x10E6/uL (ref 3.77–5.28)
RDW: 12.4 % (ref 11.7–15.4)
WBC: 7.9 10*3/uL (ref 3.4–10.8)

## 2021-07-04 LAB — LIPID PANEL
Chol/HDL Ratio: 2.3 ratio (ref 0.0–4.4)
Cholesterol, Total: 204 mg/dL — ABNORMAL HIGH (ref 100–199)
HDL: 88 mg/dL (ref >39)
LDL Chol Calc (NIH): 106 mg/dL — ABNORMAL HIGH (ref 0–99)
Triglycerides: 52 mg/dL (ref 0–149)
VLDL Cholesterol Cal: 10 mg/dL (ref 5–40)

## 2021-07-04 LAB — HEMOGLOBIN A1C
Est. average glucose Bld gHb Est-mCnc: 114 mg/dL
Hgb A1c MFr Bld: 5.6 % (ref 4.8–5.6)

## 2021-07-04 LAB — TSH: TSH: 1.79 u[IU]/mL (ref 0.450–4.500)

## 2021-07-04 LAB — VITAMIN D 25 HYDROXY (VIT D DEFICIENCY, FRACTURES): Vit D, 25-Hydroxy: 23 ng/mL — ABNORMAL LOW (ref 30.0–100.0)

## 2021-07-10 ENCOUNTER — Other Ambulatory Visit: Payer: Self-pay

## 2021-07-10 ENCOUNTER — Encounter: Payer: Self-pay | Admitting: Physician Assistant

## 2021-07-10 ENCOUNTER — Ambulatory Visit (INDEPENDENT_AMBULATORY_CARE_PROVIDER_SITE_OTHER): Payer: BC Managed Care – PPO | Admitting: Physician Assistant

## 2021-07-10 VITALS — BP 108/72 | HR 72 | Temp 97.9°F | Ht 64.0 in | Wt 154.0 lb

## 2021-07-10 DIAGNOSIS — Z0000 Encounter for general adult medical examination without abnormal findings: Secondary | ICD-10-CM

## 2021-07-10 DIAGNOSIS — L219 Seborrheic dermatitis, unspecified: Secondary | ICD-10-CM | POA: Diagnosis not present

## 2021-07-10 MED ORDER — KETOCONAZOLE 2 % EX SHAM: MEDICATED_SHAMPOO | CUTANEOUS | 0 refills | Status: AC

## 2021-07-10 NOTE — Progress Notes (Signed)
Complete physical exam   Patient: Karen Frazier   DOB: 05-Sep-1980   40 y.o. Female  MRN: [id number] Visit Date: 07/10/2021   Chief Complaint  Patient presents with   Annual Exam   Subjective    Karen Frazier is a 41 y.o. female who presents today for a complete physical exam.  She reports consuming a general diet. Gym/ health club routine includes light weights. She generally feels fairly well. She does not have additional problems to discuss today.     Past Medical History:  Diagnosis Date   Abnormal uterine bleeding (AUB)    Acute superficial venous thrombosis of right lower extremity 2016   Anemia    Asthma    Eczema    Environmental allergies    GERD (gastroesophageal reflux disease)    History of chicken pox    Seasonal allergies    Past Surgical History:  Procedure Laterality Date   CYSTOSCOPY N/A 07/27/2020   Procedure: CYSTOSCOPY;  Surgeon: Rande Brunt, MD;  Location: [facility];  Service: Gynecology;  Laterality: N/A;   NO PAST SURGERIES     VAGINAL HYSTERECTOMY Bilateral 07/27/2020   Procedure: HYSTERECTOMY VAGINALw/ Bilateral salpingectomy;  Surgeon: Rande Brunt, MD;  [facility];  Service: Gynecology;  Laterality: Bilateral;   Social History   Socioeconomic History   Marital status: Married    Spouse name: Not on file   Number of children: Not on file   Years of education: Not on file   Highest education level: Not on file  Occupational History   Occupation: farmer  Tobacco Use   Smoking status: Never   Smokeless tobacco: Never  Vaping Use   Vaping Use: Never used  Substance and Sexual Activity   Alcohol use: Yes    Alcohol/week: 1.0 standard drink    Types: 1 Standard drinks or equivalent per week    Comment: occ   Drug use: No   Sexual activity: Yes    Partners: Male    Birth control/protection: Surgical  Other Topics Concern   Not on file  Social History Narrative   Not on  file   Social Determinants of Health   Financial Resource Strain: Not on file  Food Insecurity: Not on file  Transportation Needs: Not on file  Physical Activity: Not on file  Stress: Not on file  Social Connections: Not on file  Intimate Partner Violence: Not on file     Medications: Outpatient Medications Prior to Visit  Medication Sig   [DISCONTINUED] ketoconazole (NIZORAL) 2 % shampoo ketoconazole 2 % shampoo  LATHER AND LET SIT ON SKIN FOR 3 TO 5 MINUTES THEN RINSE OFF. USE 2 TO 3 TIMES A WEEK.   albuterol (PROVENTIL HFA;VENTOLIN HFA) 108 (90 BASE) MCG/ACT inhaler Inhale 1-2 puffs into the lungs every 6 (six) hours as needed for wheezing or shortness of breath.   EPINEPHrine 0.3 mg/0.3 mL IJ SOAJ injection Inject into the muscle once. Reported on 07/15/2015   montelukast (SINGULAIR) 10 MG tablet Take 10 mg by mouth daily as needed.   triamcinolone ointment (KENALOG) 0.1 % Apply 1 application topically as needed.   [DISCONTINUED] Cyanocobalamin (VITAMIN B 12 PO) Take by mouth. 5000 mg daily   [DISCONTINUED] Ferrous Sulfate Dried (HIGH POTENCY IRON) 65 MG TABS Take by mouth daily.   [DISCONTINUED] ibuprofen (ADVIL) 800 MG tablet Take 1 tablet (800 mg total) by mouth every 8 (eight) hours as needed.   [DISCONTINUED] OVER THE COUNTER MEDICATION  Vitamin d 3 187.5 mg plus vitamin k 200 mg 1 daily   [DISCONTINUED] OVER THE COUNTER MEDICATION Raw zinc 30 mg daily   [DISCONTINUED] Probiotic Product (PROBIOTIC ADVANCED PO) Take by mouth. 2 tabs daily   [DISCONTINUED] vitamin C (ASCORBIC ACID) 500 MG tablet Take 500 mg by mouth daily.   No facility-administered medications prior to visit.    Review of Systems Review of Systems:  A fourteen system review of systems was performed and found to be positive as per HPI.    Objective    BP 108/72    Pulse 72    Temp 97.9 F (36.6 C)    Ht 5\' 4"  (1.626 m)    Wt 154 lb (69.9 kg)    LMP 07/20/2020    SpO2 100%    BMI 26.43 kg/m     Physical Exam   General Appearance:     Well developed, well nourished female. Alert, cooperative, in no acute distress, appears stated age   Head:    Normocephalic, without obvious abnormality, atraumatic  Eyes:    PERRL, conjunctiva/corneas clear, EOM's intact, fundi    benign, both eyes  Ears:    Normal TM's and external ear canals, both ears  Nose:   Nares normal, septum midline, mucosa normal, no drainage    or sinus tenderness  Throat:   Lips, mucosa, and tongue normal; teeth and gums normal  Neck:   Supple, symmetrical, trachea midline, no adenopathy;    thyroid:  normal to inspection and palpation; no JVD  Back:     Symmetric, no curvature, ROM normal, no CVA tenderness  Lungs:     Clear to auscultation bilaterally, respirations unlabored  Chest Wall:    No tenderness or deformity   Heart:    Normal heart rate. Normal rhythm. No murmurs, rubs, or gallops.    Breast Exam:    deferred  Abdomen:     Soft, non-tender, bowel sounds active all four quadrants,    no masses, no organomegaly  Pelvic:    deferred  Extremities:   All extremities are intact. No cyanosis or edema  Pulses:   2+ and symmetric all extremities  Skin:   Skin color, texture, turgor normal, no rashes or lesions  Lymph nodes:   Cervical, supraclavicular, and axillary nodes normal  Neurologic:   CNII-XII grossly intact     Last depression screening scores PHQ 2/9 Scores 07/10/2021 01/18/2021 10/12/2019  PHQ - 2 Score 0 0 0  PHQ- 9 Score 3 0 3   Last fall risk screening Fall Risk  07/10/2021  Falls in the past year? 0  Number falls in past yr: 0  Injury with Fall? 0  Risk for fall due to : No Fall Risks  Follow up Falls evaluation completed     No results found for any visits on 07/10/21.  Assessment & Plan    Routine Health Maintenance and Physical Exam  Exercise Activities and Dietary recommendations -Continue with moderate aerobic exercise and follow a heart healthy diet.    Immunization  History  Administered Date(s) Administered   Influenza,inj,Quad PF,6+ Mos 07/15/2015   PFIZER(Purple Top)SARS-COV-2 Vaccination 08/19/2020   Tdap 08/04/2014    Health Maintenance  Topic Date Due   Hepatitis C Screening  Never done   COVID-19 Vaccine (2 - Pfizer series) 09/09/2020   INFLUENZA VACCINE  01/16/2021   TETANUS/TDAP  08/04/2024   HIV Screening  Completed   HPV VACCINES  Aged Out   PAP SMEAR-Modifier  Discontinued    Discussed health benefits of physical activity, and encouraged her to engage in regular exercise appropriate for her age and condition.  Problem List Items Addressed This Visit   None Visit Diagnoses     Encounter for general adult medical examination without abnormal findings    -  Primary   Seborrheic eczema of scalp       Relevant Medications   ketoconazole (NIZORAL) 2 % shampoo      Discussed with patient most recent lab results which are essentially within normal limits or stable from prior. Cholesterol panel has improved. Advised to resume OTC Vit D supplement 2000 units daily (pt reports has not been taking).  Praised patient for weight loss.   Patient declined influenza vaccine.  Return in about 1 year (around 07/10/2022) for CPE and FBW .      Mayer Masker, PA-C  [facility] [phone] (phone) [phone] (fax)  [facility]

## 2021-07-10 NOTE — Patient Instructions (Signed)

## 2021-07-18 DIAGNOSIS — J301 Allergic rhinitis due to pollen: Secondary | ICD-10-CM | POA: Diagnosis not present

## 2021-07-18 DIAGNOSIS — J3081 Allergic rhinitis due to animal (cat) (dog) hair and dander: Secondary | ICD-10-CM | POA: Diagnosis not present

## 2021-07-18 DIAGNOSIS — J3089 Other allergic rhinitis: Secondary | ICD-10-CM | POA: Diagnosis not present

## 2021-08-09 DIAGNOSIS — J301 Allergic rhinitis due to pollen: Secondary | ICD-10-CM | POA: Diagnosis not present

## 2021-08-09 DIAGNOSIS — J3081 Allergic rhinitis due to animal (cat) (dog) hair and dander: Secondary | ICD-10-CM | POA: Diagnosis not present

## 2021-08-09 DIAGNOSIS — J3089 Other allergic rhinitis: Secondary | ICD-10-CM | POA: Diagnosis not present

## 2021-08-23 DIAGNOSIS — J3089 Other allergic rhinitis: Secondary | ICD-10-CM | POA: Diagnosis not present

## 2021-08-23 DIAGNOSIS — J3081 Allergic rhinitis due to animal (cat) (dog) hair and dander: Secondary | ICD-10-CM | POA: Diagnosis not present

## 2021-08-23 DIAGNOSIS — J301 Allergic rhinitis due to pollen: Secondary | ICD-10-CM | POA: Diagnosis not present

## 2021-08-29 DIAGNOSIS — J3089 Other allergic rhinitis: Secondary | ICD-10-CM | POA: Diagnosis not present

## 2021-08-29 DIAGNOSIS — J3081 Allergic rhinitis due to animal (cat) (dog) hair and dander: Secondary | ICD-10-CM | POA: Diagnosis not present

## 2021-08-29 DIAGNOSIS — J301 Allergic rhinitis due to pollen: Secondary | ICD-10-CM | POA: Diagnosis not present

## 2021-09-07 DIAGNOSIS — J301 Allergic rhinitis due to pollen: Secondary | ICD-10-CM | POA: Diagnosis not present

## 2021-09-07 DIAGNOSIS — J3081 Allergic rhinitis due to animal (cat) (dog) hair and dander: Secondary | ICD-10-CM | POA: Diagnosis not present

## 2021-09-08 DIAGNOSIS — J3089 Other allergic rhinitis: Secondary | ICD-10-CM | POA: Diagnosis not present

## 2021-09-08 DIAGNOSIS — J301 Allergic rhinitis due to pollen: Secondary | ICD-10-CM | POA: Diagnosis not present

## 2021-09-08 DIAGNOSIS — J4520 Mild intermittent asthma, uncomplicated: Secondary | ICD-10-CM | POA: Diagnosis not present

## 2021-09-08 DIAGNOSIS — L2089 Other atopic dermatitis: Secondary | ICD-10-CM | POA: Diagnosis not present

## 2021-09-08 DIAGNOSIS — J3081 Allergic rhinitis due to animal (cat) (dog) hair and dander: Secondary | ICD-10-CM | POA: Diagnosis not present

## 2021-09-12 DIAGNOSIS — J3081 Allergic rhinitis due to animal (cat) (dog) hair and dander: Secondary | ICD-10-CM | POA: Diagnosis not present

## 2021-09-12 DIAGNOSIS — J301 Allergic rhinitis due to pollen: Secondary | ICD-10-CM | POA: Diagnosis not present

## 2021-09-12 DIAGNOSIS — J3089 Other allergic rhinitis: Secondary | ICD-10-CM | POA: Diagnosis not present

## 2021-09-19 DIAGNOSIS — J3089 Other allergic rhinitis: Secondary | ICD-10-CM | POA: Diagnosis not present

## 2021-09-19 DIAGNOSIS — J3081 Allergic rhinitis due to animal (cat) (dog) hair and dander: Secondary | ICD-10-CM | POA: Diagnosis not present

## 2021-09-19 DIAGNOSIS — J301 Allergic rhinitis due to pollen: Secondary | ICD-10-CM | POA: Diagnosis not present

## 2021-09-27 DIAGNOSIS — J3081 Allergic rhinitis due to animal (cat) (dog) hair and dander: Secondary | ICD-10-CM | POA: Diagnosis not present

## 2021-09-27 DIAGNOSIS — J3089 Other allergic rhinitis: Secondary | ICD-10-CM | POA: Diagnosis not present

## 2021-09-27 DIAGNOSIS — J301 Allergic rhinitis due to pollen: Secondary | ICD-10-CM | POA: Diagnosis not present

## 2021-10-04 DIAGNOSIS — J3089 Other allergic rhinitis: Secondary | ICD-10-CM | POA: Diagnosis not present

## 2021-10-04 DIAGNOSIS — J3081 Allergic rhinitis due to animal (cat) (dog) hair and dander: Secondary | ICD-10-CM | POA: Diagnosis not present

## 2021-10-04 DIAGNOSIS — J301 Allergic rhinitis due to pollen: Secondary | ICD-10-CM | POA: Diagnosis not present

## 2021-10-18 DIAGNOSIS — J301 Allergic rhinitis due to pollen: Secondary | ICD-10-CM | POA: Diagnosis not present

## 2021-10-18 DIAGNOSIS — J3081 Allergic rhinitis due to animal (cat) (dog) hair and dander: Secondary | ICD-10-CM | POA: Diagnosis not present

## 2021-10-18 DIAGNOSIS — J3089 Other allergic rhinitis: Secondary | ICD-10-CM | POA: Diagnosis not present

## 2021-10-25 DIAGNOSIS — J3089 Other allergic rhinitis: Secondary | ICD-10-CM | POA: Diagnosis not present

## 2021-10-25 DIAGNOSIS — J3081 Allergic rhinitis due to animal (cat) (dog) hair and dander: Secondary | ICD-10-CM | POA: Diagnosis not present

## 2021-10-25 DIAGNOSIS — J301 Allergic rhinitis due to pollen: Secondary | ICD-10-CM | POA: Diagnosis not present

## 2021-11-01 DIAGNOSIS — J3081 Allergic rhinitis due to animal (cat) (dog) hair and dander: Secondary | ICD-10-CM | POA: Diagnosis not present

## 2021-11-01 DIAGNOSIS — J3089 Other allergic rhinitis: Secondary | ICD-10-CM | POA: Diagnosis not present

## 2021-11-01 DIAGNOSIS — J301 Allergic rhinitis due to pollen: Secondary | ICD-10-CM | POA: Diagnosis not present

## 2021-11-15 DIAGNOSIS — J301 Allergic rhinitis due to pollen: Secondary | ICD-10-CM | POA: Diagnosis not present

## 2021-11-15 DIAGNOSIS — J3081 Allergic rhinitis due to animal (cat) (dog) hair and dander: Secondary | ICD-10-CM | POA: Diagnosis not present

## 2021-11-15 DIAGNOSIS — J3089 Other allergic rhinitis: Secondary | ICD-10-CM | POA: Diagnosis not present

## 2021-11-29 DIAGNOSIS — J3089 Other allergic rhinitis: Secondary | ICD-10-CM | POA: Diagnosis not present

## 2021-11-29 DIAGNOSIS — J3081 Allergic rhinitis due to animal (cat) (dog) hair and dander: Secondary | ICD-10-CM | POA: Diagnosis not present

## 2021-11-29 DIAGNOSIS — J301 Allergic rhinitis due to pollen: Secondary | ICD-10-CM | POA: Diagnosis not present

## 2021-12-13 DIAGNOSIS — J301 Allergic rhinitis due to pollen: Secondary | ICD-10-CM | POA: Diagnosis not present

## 2021-12-13 DIAGNOSIS — J3081 Allergic rhinitis due to animal (cat) (dog) hair and dander: Secondary | ICD-10-CM | POA: Diagnosis not present

## 2021-12-13 DIAGNOSIS — J3089 Other allergic rhinitis: Secondary | ICD-10-CM | POA: Diagnosis not present

## 2021-12-26 DIAGNOSIS — J301 Allergic rhinitis due to pollen: Secondary | ICD-10-CM | POA: Diagnosis not present

## 2021-12-26 DIAGNOSIS — J3089 Other allergic rhinitis: Secondary | ICD-10-CM | POA: Diagnosis not present

## 2021-12-26 DIAGNOSIS — J3081 Allergic rhinitis due to animal (cat) (dog) hair and dander: Secondary | ICD-10-CM | POA: Diagnosis not present

## 2022-01-09 DIAGNOSIS — J301 Allergic rhinitis due to pollen: Secondary | ICD-10-CM | POA: Diagnosis not present

## 2022-01-09 DIAGNOSIS — J3081 Allergic rhinitis due to animal (cat) (dog) hair and dander: Secondary | ICD-10-CM | POA: Diagnosis not present

## 2022-01-09 DIAGNOSIS — J3089 Other allergic rhinitis: Secondary | ICD-10-CM | POA: Diagnosis not present

## 2022-01-18 DIAGNOSIS — Z1231 Encounter for screening mammogram for malignant neoplasm of breast: Secondary | ICD-10-CM | POA: Diagnosis not present

## 2022-01-18 DIAGNOSIS — Z01419 Encounter for gynecological examination (general) (routine) without abnormal findings: Secondary | ICD-10-CM | POA: Diagnosis not present

## 2022-01-18 DIAGNOSIS — Z6828 Body mass index (BMI) 28.0-28.9, adult: Secondary | ICD-10-CM | POA: Diagnosis not present

## 2022-01-23 DIAGNOSIS — J301 Allergic rhinitis due to pollen: Secondary | ICD-10-CM | POA: Diagnosis not present

## 2022-01-23 DIAGNOSIS — J3089 Other allergic rhinitis: Secondary | ICD-10-CM | POA: Diagnosis not present

## 2022-01-23 DIAGNOSIS — J3081 Allergic rhinitis due to animal (cat) (dog) hair and dander: Secondary | ICD-10-CM | POA: Diagnosis not present

## 2022-01-31 DIAGNOSIS — J3089 Other allergic rhinitis: Secondary | ICD-10-CM | POA: Diagnosis not present

## 2022-01-31 DIAGNOSIS — J3081 Allergic rhinitis due to animal (cat) (dog) hair and dander: Secondary | ICD-10-CM | POA: Diagnosis not present

## 2022-01-31 DIAGNOSIS — J301 Allergic rhinitis due to pollen: Secondary | ICD-10-CM | POA: Diagnosis not present

## 2022-02-13 DIAGNOSIS — J3089 Other allergic rhinitis: Secondary | ICD-10-CM | POA: Diagnosis not present

## 2022-02-13 DIAGNOSIS — J3081 Allergic rhinitis due to animal (cat) (dog) hair and dander: Secondary | ICD-10-CM | POA: Diagnosis not present

## 2022-02-13 DIAGNOSIS — J301 Allergic rhinitis due to pollen: Secondary | ICD-10-CM | POA: Diagnosis not present

## 2022-02-27 DIAGNOSIS — J3081 Allergic rhinitis due to animal (cat) (dog) hair and dander: Secondary | ICD-10-CM | POA: Diagnosis not present

## 2022-02-27 DIAGNOSIS — J3089 Other allergic rhinitis: Secondary | ICD-10-CM | POA: Diagnosis not present

## 2022-02-27 DIAGNOSIS — J301 Allergic rhinitis due to pollen: Secondary | ICD-10-CM | POA: Diagnosis not present

## 2022-03-13 DIAGNOSIS — J3081 Allergic rhinitis due to animal (cat) (dog) hair and dander: Secondary | ICD-10-CM | POA: Diagnosis not present

## 2022-03-13 DIAGNOSIS — J3089 Other allergic rhinitis: Secondary | ICD-10-CM | POA: Diagnosis not present

## 2022-03-13 DIAGNOSIS — J301 Allergic rhinitis due to pollen: Secondary | ICD-10-CM | POA: Diagnosis not present

## 2022-03-27 DIAGNOSIS — J3081 Allergic rhinitis due to animal (cat) (dog) hair and dander: Secondary | ICD-10-CM | POA: Diagnosis not present

## 2022-03-27 DIAGNOSIS — J3089 Other allergic rhinitis: Secondary | ICD-10-CM | POA: Diagnosis not present

## 2022-03-27 DIAGNOSIS — J301 Allergic rhinitis due to pollen: Secondary | ICD-10-CM | POA: Diagnosis not present

## 2022-04-10 DIAGNOSIS — J3089 Other allergic rhinitis: Secondary | ICD-10-CM | POA: Diagnosis not present

## 2022-04-10 DIAGNOSIS — J3081 Allergic rhinitis due to animal (cat) (dog) hair and dander: Secondary | ICD-10-CM | POA: Diagnosis not present

## 2022-04-10 DIAGNOSIS — J301 Allergic rhinitis due to pollen: Secondary | ICD-10-CM | POA: Diagnosis not present

## 2022-04-24 DIAGNOSIS — J3081 Allergic rhinitis due to animal (cat) (dog) hair and dander: Secondary | ICD-10-CM | POA: Diagnosis not present

## 2022-04-24 DIAGNOSIS — J301 Allergic rhinitis due to pollen: Secondary | ICD-10-CM | POA: Diagnosis not present

## 2022-04-24 DIAGNOSIS — J3089 Other allergic rhinitis: Secondary | ICD-10-CM | POA: Diagnosis not present

## 2022-05-08 DIAGNOSIS — J3089 Other allergic rhinitis: Secondary | ICD-10-CM | POA: Diagnosis not present

## 2022-05-08 DIAGNOSIS — J3081 Allergic rhinitis due to animal (cat) (dog) hair and dander: Secondary | ICD-10-CM | POA: Diagnosis not present

## 2022-05-08 DIAGNOSIS — J301 Allergic rhinitis due to pollen: Secondary | ICD-10-CM | POA: Diagnosis not present

## 2022-05-29 DIAGNOSIS — J3081 Allergic rhinitis due to animal (cat) (dog) hair and dander: Secondary | ICD-10-CM | POA: Diagnosis not present

## 2022-05-29 DIAGNOSIS — J3089 Other allergic rhinitis: Secondary | ICD-10-CM | POA: Diagnosis not present

## 2022-05-29 DIAGNOSIS — J301 Allergic rhinitis due to pollen: Secondary | ICD-10-CM | POA: Diagnosis not present

## 2022-06-19 DIAGNOSIS — J3089 Other allergic rhinitis: Secondary | ICD-10-CM | POA: Diagnosis not present

## 2022-06-19 DIAGNOSIS — J301 Allergic rhinitis due to pollen: Secondary | ICD-10-CM | POA: Diagnosis not present

## 2022-06-19 DIAGNOSIS — J3081 Allergic rhinitis due to animal (cat) (dog) hair and dander: Secondary | ICD-10-CM | POA: Diagnosis not present

## 2022-07-03 DIAGNOSIS — J301 Allergic rhinitis due to pollen: Secondary | ICD-10-CM | POA: Diagnosis not present

## 2022-07-03 DIAGNOSIS — J3081 Allergic rhinitis due to animal (cat) (dog) hair and dander: Secondary | ICD-10-CM | POA: Diagnosis not present

## 2022-07-03 DIAGNOSIS — J3089 Other allergic rhinitis: Secondary | ICD-10-CM | POA: Diagnosis not present

## 2022-07-17 DIAGNOSIS — J3089 Other allergic rhinitis: Secondary | ICD-10-CM | POA: Diagnosis not present

## 2022-07-17 DIAGNOSIS — J301 Allergic rhinitis due to pollen: Secondary | ICD-10-CM | POA: Diagnosis not present

## 2022-07-17 DIAGNOSIS — J3081 Allergic rhinitis due to animal (cat) (dog) hair and dander: Secondary | ICD-10-CM | POA: Diagnosis not present

## 2022-07-24 DIAGNOSIS — J3089 Other allergic rhinitis: Secondary | ICD-10-CM | POA: Diagnosis not present

## 2022-07-24 DIAGNOSIS — J301 Allergic rhinitis due to pollen: Secondary | ICD-10-CM | POA: Diagnosis not present

## 2022-08-01 ENCOUNTER — Encounter: Payer: Self-pay | Admitting: Nurse Practitioner

## 2022-08-01 ENCOUNTER — Ambulatory Visit: Payer: BC Managed Care – PPO | Admitting: Nurse Practitioner

## 2022-08-01 VITALS — BP 122/78 | HR 81 | Temp 98.5°F | Ht 64.0 in | Wt 155.0 lb

## 2022-08-01 DIAGNOSIS — Z1159 Encounter for screening for other viral diseases: Secondary | ICD-10-CM

## 2022-08-01 DIAGNOSIS — Z0000 Encounter for general adult medical examination without abnormal findings: Secondary | ICD-10-CM

## 2022-08-01 DIAGNOSIS — E663 Overweight: Secondary | ICD-10-CM

## 2022-08-01 DIAGNOSIS — K5904 Chronic idiopathic constipation: Secondary | ICD-10-CM | POA: Diagnosis not present

## 2022-08-01 DIAGNOSIS — J4520 Mild intermittent asthma, uncomplicated: Secondary | ICD-10-CM | POA: Diagnosis not present

## 2022-08-01 DIAGNOSIS — E8809 Other disorders of plasma-protein metabolism, not elsewhere classified: Secondary | ICD-10-CM

## 2022-08-01 DIAGNOSIS — Z90710 Acquired absence of both cervix and uterus: Secondary | ICD-10-CM

## 2022-08-01 NOTE — Patient Instructions (Signed)
Nice to see you today Make a FASTING lab appointment within the next 2 weeks.  I want to see you in a year for your physical, sooner if you need me

## 2022-08-01 NOTE — Progress Notes (Signed)
New Patient Office Visit  Subjective    Patient ID: Karen Frazier, female    DOB: 31-Aug-1980  Age: 42 y.o. MRN: [id number]  CC:  Chief Complaint  Patient presents with   Establish Care    HPI [name] presents to establish care  Constipation: states taht it is getting better. She has stopped taking all laxatives. She has been using otc enzyme about a month ago. Linzess in the past did not help. Has used mira lax, stool softners. Magnesium and laxatives   Alpha gal: Dr Katrinka Blazing, epipen and allergy shots. Has had analhalyxis in the past  Eczema: Allergy doctor   for complete physical and follow up of chronic conditions.  Immunizations: -Tetanus: Completed in 2016 -Influenza: refused -Shingles: Completed Shingrix series -Pneumonia: Completed in  Covid original series and booters  -HPV:  Diet: Fair diet. States 1-2 meals a day and leaves for work at [employer]. [employer] through the day. Hot tea in the am  Exercise: No regular exercise.4-5 times a week, mostly weights some cardio states 1-2 hours per seesion   Eye exam: Lasix procedure  Dental exam: Completes semi-annually   Pap Smear: Completed in hystrectomy. States that she is followed annual. [facility] Mammogram: Completed in 2023  Colonoscopy: Completed in  Lung Cancer Screening: Completed in  Dexa: Completed in  PSA: Due  Sleep: sates that she does not sleep lots or well. States that she goes to bed aroun 830-930. Takes an hour to fall asleep and get up at 215. Feels rested. Does not snore   Outpatient Encounter Medications as of 08/01/2022  Medication Sig   albuterol (PROVENTIL HFA;VENTOLIN HFA) 108 (90 BASE) MCG/ACT inhaler Inhale 1-2 puffs into the lungs every 6 (six) hours as needed for wheezing or shortness of breath.   EPINEPHrine 0.3 mg/0.3 mL IJ SOAJ injection Inject into the muscle once. Reported on 07/15/2015   ketoconazole (NIZORAL) 2 % shampoo ketoconazole 2 % shampoo  LATHER AND LET SIT ON  SKIN FOR 3 TO 5 MINUTES THEN RINSE OFF. USE 2 TO 3 TIMES A WEEK.   montelukast (SINGULAIR) 10 MG tablet Take 10 mg by mouth daily as needed.   triamcinolone ointment (KENALOG) 0.1 % Apply 1 application topically as needed.   No facility-administered encounter medications on file as of 08/01/2022.    Past Medical History:  Diagnosis Date   Abnormal uterine bleeding (AUB)    Acute superficial venous thrombosis of right lower extremity 2016   Anemia    Asthma    Eczema    Environmental allergies    GERD (gastroesophageal reflux disease)    History of chicken pox    Seasonal allergies     Past Surgical History:  Procedure Laterality Date   CYSTOSCOPY N/A 07/27/2020   Procedure: CYSTOSCOPY;  Surgeon: Jonelle Sidle, MD;  Location: [facility];  Service: Gynecology;  Laterality: N/A;   NO PAST SURGERIES     VAGINAL HYSTERECTOMY Bilateral 07/27/2020   Procedure: HYSTERECTOMY VAGINALw/ Bilateral salpingectomy;  Surgeon: Jonelle Sidle, MD;  Location: [facility];  Service: Gynecology;  Laterality: Bilateral;    Family History  Problem Relation Age of Onset   Hyperlipidemia Father        Living   Hypertension Father    Diabetes Mother        Living   Hypertension Mother    Allergy (severe) Mother        Alpha-gal   Other Mother  IVP Dye   Heart disease Maternal Grandfather    COPD Paternal Grandfather    Healthy Sister        x1   Allergies Daughter        x1   Healthy Daughter        x2   Diabetes Maternal Aunt    Hyperlipidemia Maternal Aunt    Hypertension Maternal Aunt    Alcohol abuse Maternal Uncle    Heart attack Maternal Uncle    Hyperlipidemia Maternal Uncle    Hypertension Maternal Uncle     Social History   Socioeconomic History   Marital status: Married    Spouse name: Fields   Number of children: 3   Years of education: Not on file   Highest education level: Bachelor's degree (e.g., BA, AB, BS)   Occupational History   Occupation: farmer  Tobacco Use   Smoking status: Never   Smokeless tobacco: Never  Vaping Use   Vaping Use: Never used  Substance and Sexual Activity   Alcohol use: Yes    Alcohol/week: 1.0 standard drink of alcohol    Types: 1 Standard drinks or equivalent per week    Comment: occ   Drug use: No   Sexual activity: Yes    Partners: Male    Birth control/protection: Surgical  Other Topics Concern   Not on file  Social History Narrative   Fulltime: [employer]      [name] (66)   [name] (3)   [name] (43) all girls      Hobbies: bake, paint, and farming    Social Determinants of Radio broadcast assistant Strain: Not on file  Food Insecurity: Not on file  Transportation Needs: Not on file  Physical Activity: Not on file  Stress: Not on file  Social Connections: Not on file  Intimate Partner Violence: Not on file    Review of Systems  Constitutional:  Negative for chills and fever.  Respiratory:  Negative for shortness of breath.   Cardiovascular:  Negative for chest pain and leg swelling.  Gastrointestinal:  Negative for abdominal pain, blood in stool, constipation, diarrhea, nausea and vomiting.       Bm every 3rd day   Genitourinary:  Negative for dysuria and hematuria.  Neurological:  Negative for tingling and headaches.  Psychiatric/Behavioral:  Negative for hallucinations and suicidal ideas.         Objective    BP 122/78   Pulse 81   Temp 98.5 F (36.9 C) (Oral)   Ht 5' 4"$  (1.626 m)   Wt 155 lb (70.3 kg)   LMP 07/20/2020   SpO2 99%   BMI 26.61 kg/m   Physical Exam Vitals and nursing note reviewed.  Constitutional:      Appearance: Normal appearance.  HENT:     Right Ear: Tympanic membrane, ear canal and external ear normal.     Left Ear: Tympanic membrane, ear canal and external ear normal.     Mouth/Throat:     Mouth: Mucous membranes are moist.     Pharynx: Oropharynx is clear.  Eyes:     Extraocular Movements: Extraocular  movements intact.     Pupils: Pupils are equal, round, and reactive to light.  Cardiovascular:     Rate and Rhythm: Normal rate and regular rhythm.     Pulses: Normal pulses.     Heart sounds: Normal heart sounds.  Pulmonary:     Effort: Pulmonary effort is normal.     Breath  sounds: Normal breath sounds.  Abdominal:     General: Bowel sounds are normal. There is no distension.     Palpations: There is no mass.     Tenderness: There is no abdominal tenderness.     Hernia: No hernia is present.  Musculoskeletal:     Right lower leg: No edema.     Left lower leg: No edema.  Lymphadenopathy:     Cervical: No cervical adenopathy.  Skin:    General: Skin is warm.  Neurological:     General: No focal deficit present.     Mental Status: She is alert.     Deep Tendon Reflexes:     Reflex Scores:      Bicep reflexes are 2+ on the right side and 2+ on the left side.      Patellar reflexes are 2+ on the right side and 2+ on the left side.    Comments: Bilateral upper and lower extremity strength 5/5  Psychiatric:        Mood and Affect: Mood normal.        Behavior: Behavior normal.        Thought Content: Thought content normal.        Judgment: Judgment normal.         Assessment & Plan:   Problem List Items Addressed This Visit       Respiratory   Allergic asthma    Patient is followed by [facility].  States she was diagnosed with asthma as a kid and uses the inhaler approximately twice a year.  She is also on Singulair for allergies continue following with allergist as recommended taking medication as prescribed      Relevant Orders   CBC     Digestive   Chronic idiopathic constipation (Chronic)    Seems to be a longstanding issue.  Patient has tried over-the-counter stool softeners, MiraLAX, laxative.  She is very active drinks plenty of fluid.  Has tried Linzess that was ineffective.  Says she taken over-the-counter enzyme now seems to be helping.  She is also  tried magnesium over-the-counter in the past that failed      Relevant Orders   CBC   Comprehensive metabolic panel     Other   Alpha galactosidase deficiency    Does have an EpiPen if needed.  Has not had anaphylaxis to alpha-gal but allergy shots in the past.  Patient was informed that she needs to use the EpiPen she needs to be seen at the nearest emergency department.      Preventative health care - Primary    Discussed age-appropriate immunizations and screening exams.  Patient is too young for CRC screening.  Mammogram is up-to-date.  Patient no longer receives Pap smears due to partial hysterectomy.  She is still followed by GYN.  Patient was given information at discharge about preventative healthcare maintenance with anticipatory guidance for age range.      Relevant Orders   CBC   Comprehensive metabolic panel   TSH   Lipid panel   S/P hysterectomy    Patient still have ovaries intact.  She is still followed by GYN      Overweight    Patient is living healthy lifestyle.  She is exercising more than the recommended amount.  Continue      Relevant Orders   TSH   Lipid panel   Other Visit Diagnoses     Encounter for hepatitis C screening test for low risk  patient       Relevant Orders   Hepatitis C Antibody       Return in about 1 year (around 08/02/2023) for CPE and Labs.   Romilda Garret, NP

## 2022-08-01 NOTE — Assessment & Plan Note (Signed)
Seems to be a longstanding issue.  Patient has tried over-the-counter stool softeners, MiraLAX, laxative.  She is very active drinks plenty of fluid.  Has tried Linzess that was ineffective.  Says she taken over-the-counter enzyme now seems to be helping.  She is also tried magnesium over-the-counter in the past that failed

## 2022-08-01 NOTE — Assessment & Plan Note (Signed)
Patient is followed by [facility].  States she was diagnosed with asthma as a kid and uses the inhaler approximately twice a year.  She is also on Singulair for allergies continue following with allergist as recommended taking medication as prescribed

## 2022-08-01 NOTE — Assessment & Plan Note (Signed)
Does have an EpiPen if needed.  Has not had anaphylaxis to alpha-gal but allergy shots in the past.  Patient was informed that she needs to use the EpiPen she needs to be seen at the nearest emergency department.

## 2022-08-01 NOTE — Assessment & Plan Note (Signed)
Patient still have ovaries intact.  She is still followed by GYN

## 2022-08-01 NOTE — Assessment & Plan Note (Signed)
Discussed age-appropriate immunizations and screening exams.  Patient is too young for CRC screening.  Mammogram is up-to-date.  Patient no longer receives Pap smears due to partial hysterectomy.  She is still followed by GYN.  Patient was given information at discharge about preventative healthcare maintenance with anticipatory guidance for age range.

## 2022-08-01 NOTE — Assessment & Plan Note (Signed)
Patient is living healthy lifestyle.  She is exercising more than the recommended amount.  Continue

## 2022-08-07 DIAGNOSIS — J3081 Allergic rhinitis due to animal (cat) (dog) hair and dander: Secondary | ICD-10-CM | POA: Diagnosis not present

## 2022-08-07 DIAGNOSIS — J301 Allergic rhinitis due to pollen: Secondary | ICD-10-CM | POA: Diagnosis not present

## 2022-08-07 DIAGNOSIS — J3089 Other allergic rhinitis: Secondary | ICD-10-CM | POA: Diagnosis not present

## 2022-08-16 ENCOUNTER — Telehealth: Payer: Self-pay | Admitting: Nurse Practitioner

## 2022-08-16 ENCOUNTER — Other Ambulatory Visit (INDEPENDENT_AMBULATORY_CARE_PROVIDER_SITE_OTHER): Payer: BC Managed Care – PPO

## 2022-08-16 DIAGNOSIS — K5904 Chronic idiopathic constipation: Secondary | ICD-10-CM

## 2022-08-16 DIAGNOSIS — E663 Overweight: Secondary | ICD-10-CM

## 2022-08-16 DIAGNOSIS — J4520 Mild intermittent asthma, uncomplicated: Secondary | ICD-10-CM

## 2022-08-16 DIAGNOSIS — Z1159 Encounter for screening for other viral diseases: Secondary | ICD-10-CM

## 2022-08-16 DIAGNOSIS — Z0000 Encounter for general adult medical examination without abnormal findings: Secondary | ICD-10-CM

## 2022-08-16 NOTE — Telephone Encounter (Signed)
Place in box in provider office.

## 2022-08-16 NOTE — Telephone Encounter (Signed)
Patient dropped off document  physical form , to be filled out by provider. Patient requested to send it via Call Patient to pick up within 5-days. Document is located in providers tray at front office.

## 2022-08-17 ENCOUNTER — Other Ambulatory Visit: Payer: BC Managed Care – PPO

## 2022-08-17 LAB — COMPREHENSIVE METABOLIC PANEL
ALT: 17 U/L (ref 0–35)
AST: 17 U/L (ref 0–37)
Albumin: 4.4 g/dL (ref 3.5–5.2)
Alkaline Phosphatase: 45 U/L (ref 39–117)
BUN: 9 mg/dL (ref 6–23)
CO2: 25 mEq/L (ref 19–32)
Calcium: 10 mg/dL (ref 8.4–10.5)
Chloride: 105 mEq/L (ref 96–112)
Creatinine, Ser: 0.82 mg/dL (ref 0.40–1.20)
GFR: 88.5 mL/min (ref >60.00)
Glucose, Bld: 87 mg/dL (ref 70–99)
Potassium: 4.1 mEq/L (ref 3.5–5.1)
Sodium: 139 mEq/L (ref 135–145)
Total Bilirubin: 1 mg/dL (ref 0.2–1.2)
Total Protein: 6.9 g/dL (ref 6.0–8.3)

## 2022-08-17 LAB — TSH: TSH: 1.59 u[IU]/mL (ref 0.35–5.50)

## 2022-08-17 LAB — CBC
HCT: 38.9 % (ref 36.0–46.0)
Hemoglobin: 12.9 g/dL (ref 12.0–15.0)
MCHC: 33.3 g/dL (ref 30.0–36.0)
MCV: 95.8 fl (ref 78.0–100.0)
Platelets: 355 10*3/uL (ref 150.0–400.0)
RBC: 4.06 Mil/uL (ref 3.87–5.11)
RDW: 13.6 % (ref 11.5–15.5)
WBC: 8.3 10*3/uL (ref 4.0–10.5)

## 2022-08-17 LAB — LIPID PANEL
Cholesterol: 193 mg/dL (ref 0–200)
HDL: 85.1 mg/dL (ref >39.00)
LDL Cholesterol: 96 mg/dL (ref 0–99)
NonHDL: 107.77
Total CHOL/HDL Ratio: 2
Triglycerides: 61 mg/dL (ref 0.0–149.0)
VLDL: 12.2 mg/dL (ref 0.0–40.0)

## 2022-08-17 LAB — HEPATITIS C ANTIBODY: Hepatitis C Ab: NONREACTIVE

## 2022-08-21 DIAGNOSIS — J3089 Other allergic rhinitis: Secondary | ICD-10-CM | POA: Diagnosis not present

## 2022-08-21 DIAGNOSIS — J301 Allergic rhinitis due to pollen: Secondary | ICD-10-CM | POA: Diagnosis not present

## 2022-08-21 DIAGNOSIS — J3081 Allergic rhinitis due to animal (cat) (dog) hair and dander: Secondary | ICD-10-CM | POA: Diagnosis not present

## 2022-08-28 ENCOUNTER — Telehealth: Payer: Self-pay | Admitting: Nurse Practitioner

## 2022-08-28 NOTE — Telephone Encounter (Signed)
Called pt and ask questions, faxed the forms, placed in scan, and put copy in folder for pt pick up.

## 2022-08-28 NOTE — Telephone Encounter (Signed)
Patients wellness form has been completed. I did not draw an A1C because she is not overweight and her sugar was normal in lab work. I also did not measure her waist. When she picks the form up we can measure her waist if needed.   She has questions on the top of the form she needs to fill out

## 2022-08-29 DIAGNOSIS — Z6827 Body mass index (BMI) 27.0-27.9, adult: Secondary | ICD-10-CM | POA: Diagnosis not present

## 2022-08-29 DIAGNOSIS — N951 Menopausal and female climacteric states: Secondary | ICD-10-CM | POA: Diagnosis not present

## 2022-09-03 ENCOUNTER — Telehealth: Payer: Self-pay

## 2022-09-03 DIAGNOSIS — Z0000 Encounter for general adult medical examination without abnormal findings: Secondary | ICD-10-CM

## 2022-09-03 NOTE — Telephone Encounter (Signed)
Left message to return call to our office.  

## 2022-09-03 NOTE — Addendum Note (Signed)
Addended by: Michela Pitcher on: 09/03/2022 04:52 PM   Modules accepted: Orders

## 2022-09-03 NOTE — Telephone Encounter (Signed)
-----   Message from Michela Pitcher, NP sent at 09/03/2022  1:56 PM EDT ----- Regarding: RE: lab orders Not sure what she is coming in for unless she is required to have the A1C for her health form can we find out please ----- Message ----- From: Ellamae Sia Sent: 09/03/2022  11:43 AM EDT To: Michela Pitcher, NP Subject: lab orders                                     Lab orders, thanks

## 2022-09-03 NOTE — Telephone Encounter (Signed)
Patient called in and stated that she is needing her A1C checked for a health form. She stated that her last labs didn't include that. She stated she is available now and can talk if you call. Thank you!

## 2022-09-03 NOTE — Telephone Encounter (Signed)
Order was placed for A1C

## 2022-09-04 DIAGNOSIS — J3089 Other allergic rhinitis: Secondary | ICD-10-CM | POA: Diagnosis not present

## 2022-09-04 DIAGNOSIS — J3081 Allergic rhinitis due to animal (cat) (dog) hair and dander: Secondary | ICD-10-CM | POA: Diagnosis not present

## 2022-09-04 DIAGNOSIS — J301 Allergic rhinitis due to pollen: Secondary | ICD-10-CM | POA: Diagnosis not present

## 2022-09-04 NOTE — Telephone Encounter (Signed)
Left message to return call to our office.  

## 2022-09-07 ENCOUNTER — Other Ambulatory Visit (INDEPENDENT_AMBULATORY_CARE_PROVIDER_SITE_OTHER): Payer: BC Managed Care – PPO

## 2022-09-07 DIAGNOSIS — Z0000 Encounter for general adult medical examination without abnormal findings: Secondary | ICD-10-CM

## 2022-09-07 LAB — HEMOGLOBIN A1C: Hgb A1c MFr Bld: 5.6 % (ref 4.6–6.5)

## 2022-09-10 ENCOUNTER — Telehealth: Payer: Self-pay | Admitting: Nurse Practitioner

## 2022-09-10 NOTE — Telephone Encounter (Signed)
A1C added and refaxed

## 2022-09-10 NOTE — Telephone Encounter (Signed)
Pt husband called stated pt need her A1c results added to her physical form and fax back .  Please advise [phone]

## 2022-09-18 DIAGNOSIS — J3089 Other allergic rhinitis: Secondary | ICD-10-CM | POA: Diagnosis not present

## 2022-09-18 DIAGNOSIS — J301 Allergic rhinitis due to pollen: Secondary | ICD-10-CM | POA: Diagnosis not present

## 2022-09-18 DIAGNOSIS — J3081 Allergic rhinitis due to animal (cat) (dog) hair and dander: Secondary | ICD-10-CM | POA: Diagnosis not present

## 2022-10-02 DIAGNOSIS — J301 Allergic rhinitis due to pollen: Secondary | ICD-10-CM | POA: Diagnosis not present

## 2022-10-03 DIAGNOSIS — J3089 Other allergic rhinitis: Secondary | ICD-10-CM | POA: Diagnosis not present

## 2022-10-09 DIAGNOSIS — J3081 Allergic rhinitis due to animal (cat) (dog) hair and dander: Secondary | ICD-10-CM | POA: Diagnosis not present

## 2022-10-09 DIAGNOSIS — J301 Allergic rhinitis due to pollen: Secondary | ICD-10-CM | POA: Diagnosis not present

## 2022-10-09 DIAGNOSIS — J3089 Other allergic rhinitis: Secondary | ICD-10-CM | POA: Diagnosis not present

## 2022-10-16 DIAGNOSIS — J3089 Other allergic rhinitis: Secondary | ICD-10-CM | POA: Diagnosis not present

## 2022-10-16 DIAGNOSIS — J3081 Allergic rhinitis due to animal (cat) (dog) hair and dander: Secondary | ICD-10-CM | POA: Diagnosis not present

## 2022-10-16 DIAGNOSIS — J301 Allergic rhinitis due to pollen: Secondary | ICD-10-CM | POA: Diagnosis not present

## 2022-10-30 DIAGNOSIS — J3081 Allergic rhinitis due to animal (cat) (dog) hair and dander: Secondary | ICD-10-CM | POA: Diagnosis not present

## 2022-10-30 DIAGNOSIS — J301 Allergic rhinitis due to pollen: Secondary | ICD-10-CM | POA: Diagnosis not present

## 2022-10-30 DIAGNOSIS — J3089 Other allergic rhinitis: Secondary | ICD-10-CM | POA: Diagnosis not present

## 2022-11-13 DIAGNOSIS — J301 Allergic rhinitis due to pollen: Secondary | ICD-10-CM | POA: Diagnosis not present

## 2022-11-13 DIAGNOSIS — J3089 Other allergic rhinitis: Secondary | ICD-10-CM | POA: Diagnosis not present

## 2022-11-13 DIAGNOSIS — J3081 Allergic rhinitis due to animal (cat) (dog) hair and dander: Secondary | ICD-10-CM | POA: Diagnosis not present

## 2022-11-27 DIAGNOSIS — J3081 Allergic rhinitis due to animal (cat) (dog) hair and dander: Secondary | ICD-10-CM | POA: Diagnosis not present

## 2022-11-27 DIAGNOSIS — J3089 Other allergic rhinitis: Secondary | ICD-10-CM | POA: Diagnosis not present

## 2022-11-27 DIAGNOSIS — J301 Allergic rhinitis due to pollen: Secondary | ICD-10-CM | POA: Diagnosis not present

## 2022-11-30 DIAGNOSIS — J301 Allergic rhinitis due to pollen: Secondary | ICD-10-CM | POA: Diagnosis not present

## 2022-11-30 DIAGNOSIS — J3081 Allergic rhinitis due to animal (cat) (dog) hair and dander: Secondary | ICD-10-CM | POA: Diagnosis not present

## 2022-11-30 DIAGNOSIS — L2089 Other atopic dermatitis: Secondary | ICD-10-CM | POA: Diagnosis not present

## 2022-11-30 DIAGNOSIS — J3089 Other allergic rhinitis: Secondary | ICD-10-CM | POA: Diagnosis not present

## 2022-11-30 DIAGNOSIS — J4520 Mild intermittent asthma, uncomplicated: Secondary | ICD-10-CM | POA: Diagnosis not present

## 2022-12-11 DIAGNOSIS — J3089 Other allergic rhinitis: Secondary | ICD-10-CM | POA: Diagnosis not present

## 2022-12-11 DIAGNOSIS — J3081 Allergic rhinitis due to animal (cat) (dog) hair and dander: Secondary | ICD-10-CM | POA: Diagnosis not present

## 2022-12-11 DIAGNOSIS — J301 Allergic rhinitis due to pollen: Secondary | ICD-10-CM | POA: Diagnosis not present

## 2022-12-18 DIAGNOSIS — J3089 Other allergic rhinitis: Secondary | ICD-10-CM | POA: Diagnosis not present

## 2022-12-18 DIAGNOSIS — J301 Allergic rhinitis due to pollen: Secondary | ICD-10-CM | POA: Diagnosis not present

## 2022-12-18 DIAGNOSIS — J3081 Allergic rhinitis due to animal (cat) (dog) hair and dander: Secondary | ICD-10-CM | POA: Diagnosis not present

## 2022-12-25 DIAGNOSIS — J3089 Other allergic rhinitis: Secondary | ICD-10-CM | POA: Diagnosis not present

## 2022-12-25 DIAGNOSIS — J301 Allergic rhinitis due to pollen: Secondary | ICD-10-CM | POA: Diagnosis not present

## 2022-12-25 DIAGNOSIS — J3081 Allergic rhinitis due to animal (cat) (dog) hair and dander: Secondary | ICD-10-CM | POA: Diagnosis not present

## 2023-01-09 DIAGNOSIS — J301 Allergic rhinitis due to pollen: Secondary | ICD-10-CM | POA: Diagnosis not present

## 2023-01-09 DIAGNOSIS — J3089 Other allergic rhinitis: Secondary | ICD-10-CM | POA: Diagnosis not present

## 2023-01-09 DIAGNOSIS — J3081 Allergic rhinitis due to animal (cat) (dog) hair and dander: Secondary | ICD-10-CM | POA: Diagnosis not present

## 2023-01-15 DIAGNOSIS — J3081 Allergic rhinitis due to animal (cat) (dog) hair and dander: Secondary | ICD-10-CM | POA: Diagnosis not present

## 2023-01-15 DIAGNOSIS — J3089 Other allergic rhinitis: Secondary | ICD-10-CM | POA: Diagnosis not present

## 2023-01-15 DIAGNOSIS — J301 Allergic rhinitis due to pollen: Secondary | ICD-10-CM | POA: Diagnosis not present

## 2023-01-22 DIAGNOSIS — J3081 Allergic rhinitis due to animal (cat) (dog) hair and dander: Secondary | ICD-10-CM | POA: Diagnosis not present

## 2023-01-22 DIAGNOSIS — J301 Allergic rhinitis due to pollen: Secondary | ICD-10-CM | POA: Diagnosis not present

## 2023-01-22 DIAGNOSIS — J3089 Other allergic rhinitis: Secondary | ICD-10-CM | POA: Diagnosis not present

## 2023-01-25 DIAGNOSIS — Z6828 Body mass index (BMI) 28.0-28.9, adult: Secondary | ICD-10-CM | POA: Diagnosis not present

## 2023-01-25 DIAGNOSIS — Z01419 Encounter for gynecological examination (general) (routine) without abnormal findings: Secondary | ICD-10-CM | POA: Diagnosis not present

## 2023-01-25 DIAGNOSIS — Z1231 Encounter for screening mammogram for malignant neoplasm of breast: Secondary | ICD-10-CM | POA: Diagnosis not present

## 2023-01-30 ENCOUNTER — Other Ambulatory Visit: Payer: Self-pay | Admitting: Obstetrics

## 2023-01-30 DIAGNOSIS — R928 Other abnormal and inconclusive findings on diagnostic imaging of breast: Secondary | ICD-10-CM

## 2023-02-05 DIAGNOSIS — J301 Allergic rhinitis due to pollen: Secondary | ICD-10-CM | POA: Diagnosis not present

## 2023-02-05 DIAGNOSIS — J3081 Allergic rhinitis due to animal (cat) (dog) hair and dander: Secondary | ICD-10-CM | POA: Diagnosis not present

## 2023-02-05 DIAGNOSIS — J3089 Other allergic rhinitis: Secondary | ICD-10-CM | POA: Diagnosis not present

## 2023-02-11 ENCOUNTER — Ambulatory Visit
Admission: RE | Admit: 2023-02-11 | Discharge: 2023-02-11 | Disposition: A | Discharge status: Home / Self Care | Payer: BC Managed Care – PPO | Source: Ambulatory Visit | Attending: Obstetrics | Admitting: Obstetrics

## 2023-02-11 ENCOUNTER — Other Ambulatory Visit: Payer: Self-pay | Admitting: Obstetrics

## 2023-02-11 DIAGNOSIS — N6315 Unspecified lump in the right breast, overlapping quadrants: Secondary | ICD-10-CM | POA: Diagnosis not present

## 2023-02-11 DIAGNOSIS — N6310 Unspecified lump in the right breast, unspecified quadrant: Secondary | ICD-10-CM

## 2023-02-11 DIAGNOSIS — R928 Other abnormal and inconclusive findings on diagnostic imaging of breast: Secondary | ICD-10-CM

## 2023-02-11 DIAGNOSIS — N6311 Unspecified lump in the right breast, upper outer quadrant: Secondary | ICD-10-CM | POA: Diagnosis not present

## 2023-02-19 DIAGNOSIS — J3089 Other allergic rhinitis: Secondary | ICD-10-CM | POA: Diagnosis not present

## 2023-02-19 DIAGNOSIS — J301 Allergic rhinitis due to pollen: Secondary | ICD-10-CM | POA: Diagnosis not present

## 2023-02-19 DIAGNOSIS — J3081 Allergic rhinitis due to animal (cat) (dog) hair and dander: Secondary | ICD-10-CM | POA: Diagnosis not present

## 2023-02-21 IMAGING — US US ABDOMEN LIMITED
1 series · 8 of 8 positions shown · non-contrast
Comparison: None.

CLINICAL DATA: Palpable abnormality at the umbilicus for 2 years

EXAM:
ULTRASOUND ABDOMEN LIMITED

[Series 1: us abdomen limited · 0.15mm/px · 8 of 8 slices shown]
[im 1/8]
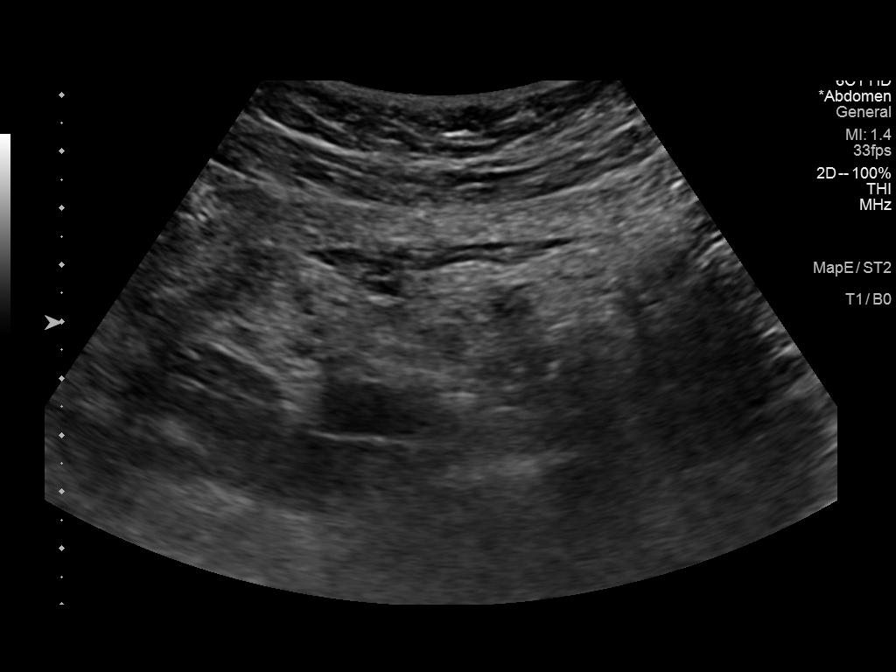
[im 2/8]
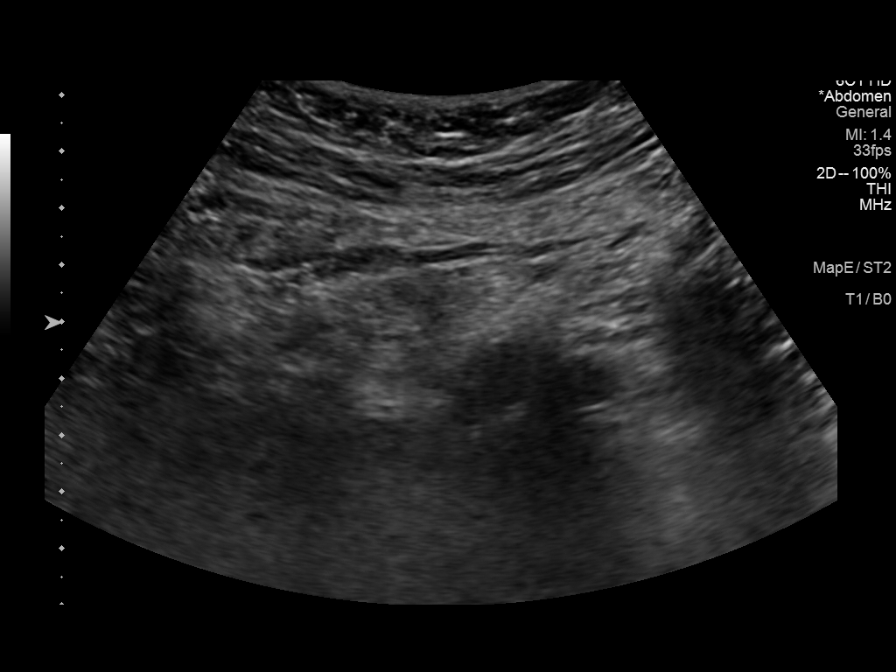
[im 3/8]
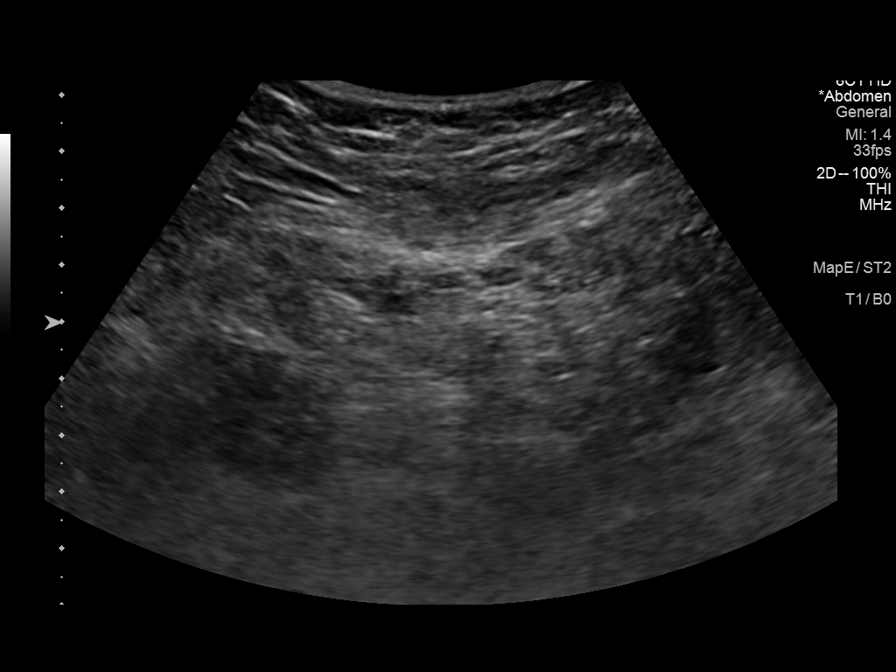
[im 4/8]
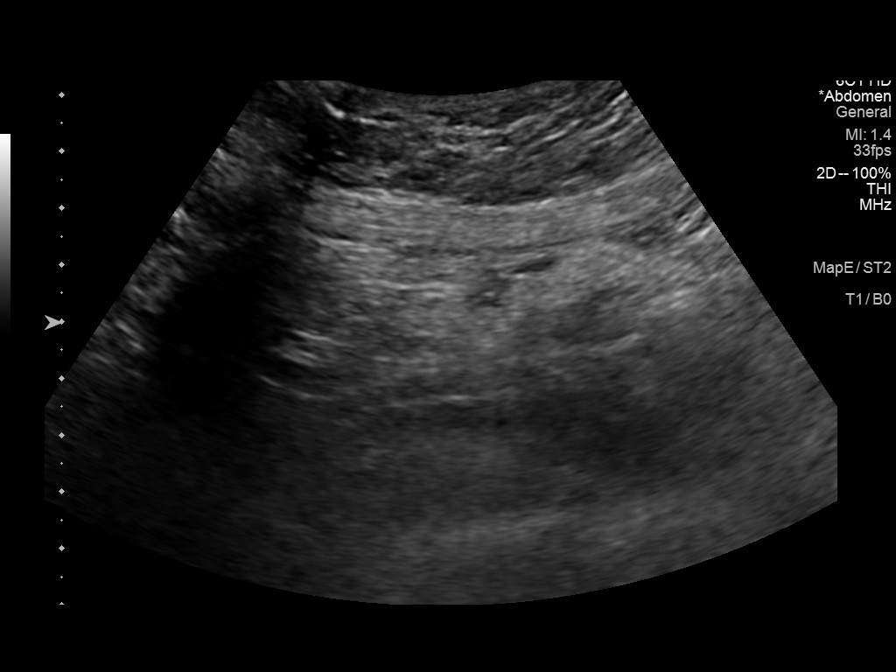
[im 5/8]
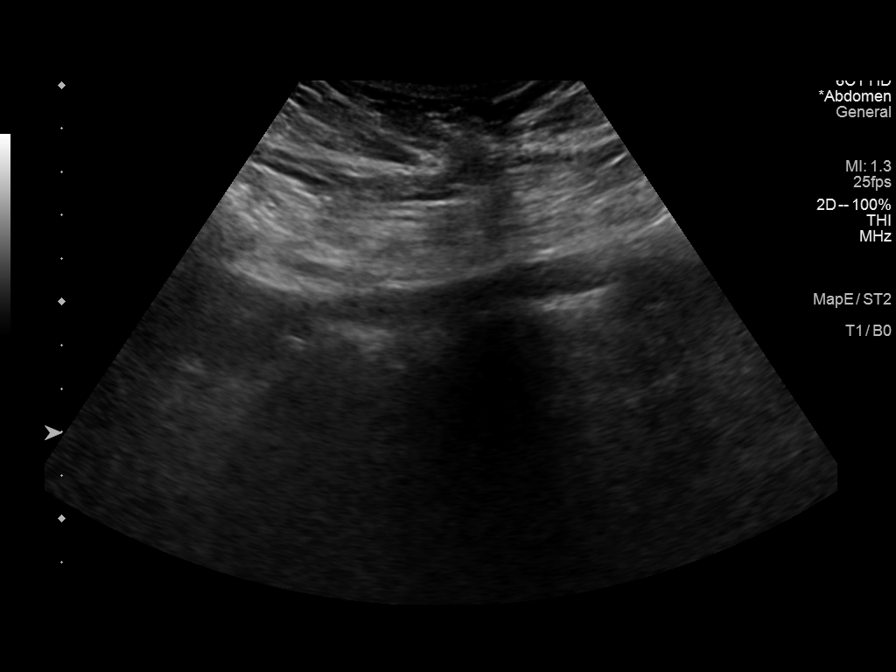
[im 6/8]
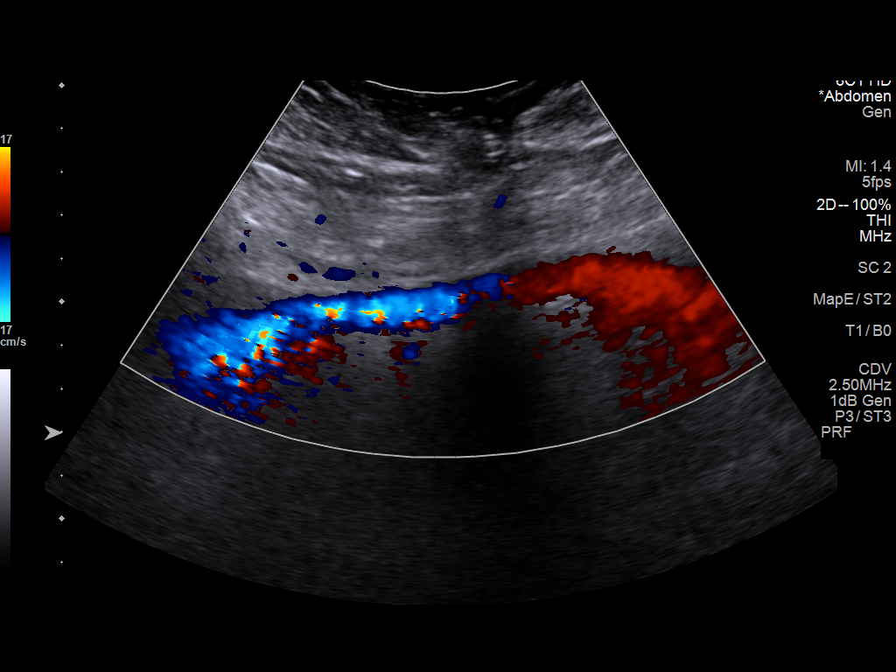
[im 7/8]
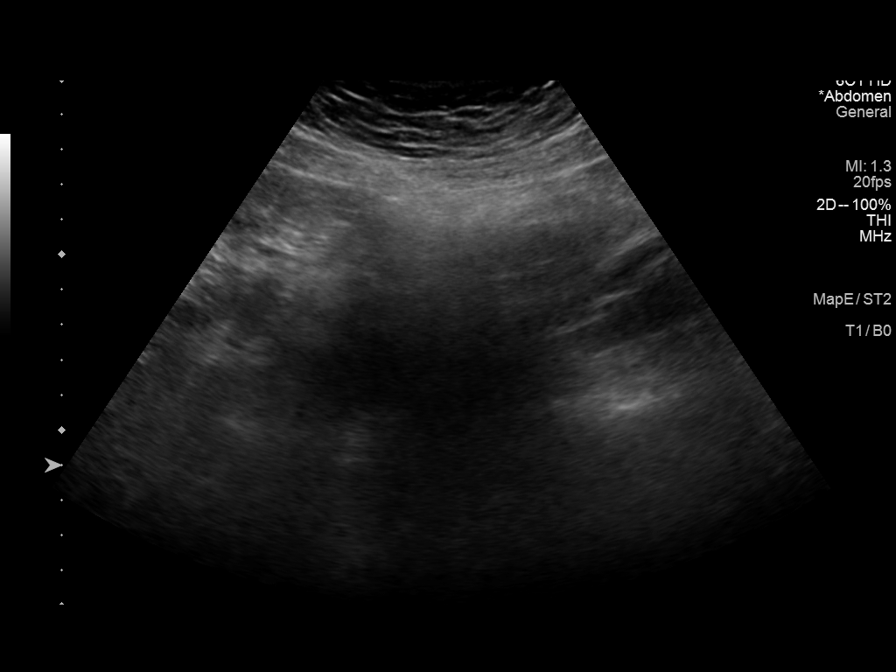
[im 8/8]
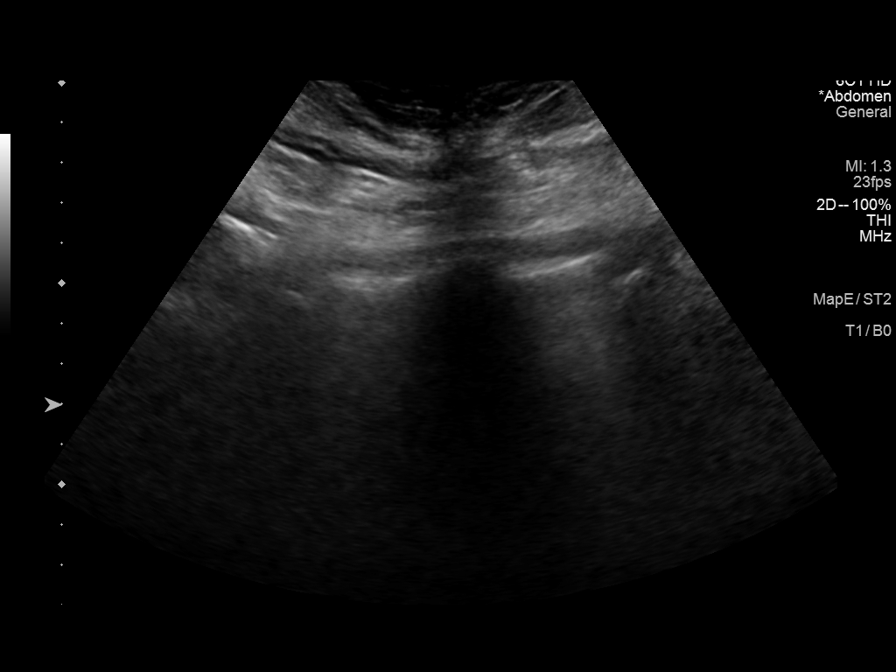

[8 of 8 positions shown; findings below may reference images not displayed]

FINDINGS: Scanning in the area of clinical concern shows no focal herniation.
IMPRESSION: No findings to suggest umbilical hernia. No other focal abnormality
is noted.

## 2023-03-05 DIAGNOSIS — J3081 Allergic rhinitis due to animal (cat) (dog) hair and dander: Secondary | ICD-10-CM | POA: Diagnosis not present

## 2023-03-05 DIAGNOSIS — J301 Allergic rhinitis due to pollen: Secondary | ICD-10-CM | POA: Diagnosis not present

## 2023-03-05 DIAGNOSIS — J3089 Other allergic rhinitis: Secondary | ICD-10-CM | POA: Diagnosis not present

## 2023-03-19 DIAGNOSIS — J301 Allergic rhinitis due to pollen: Secondary | ICD-10-CM | POA: Diagnosis not present

## 2023-03-19 DIAGNOSIS — J3089 Other allergic rhinitis: Secondary | ICD-10-CM | POA: Diagnosis not present

## 2023-03-19 DIAGNOSIS — J3081 Allergic rhinitis due to animal (cat) (dog) hair and dander: Secondary | ICD-10-CM | POA: Diagnosis not present

## 2023-03-20 ENCOUNTER — Encounter: Payer: Self-pay | Admitting: Nurse Practitioner

## 2023-03-20 ENCOUNTER — Ambulatory Visit: Payer: BC Managed Care – PPO | Admitting: Nurse Practitioner

## 2023-03-20 VITALS — BP 118/88 | HR 92 | Temp 98.5°F | Ht 64.0 in | Wt 161.4 lb

## 2023-03-20 DIAGNOSIS — R109 Unspecified abdominal pain: Secondary | ICD-10-CM | POA: Diagnosis not present

## 2023-03-20 DIAGNOSIS — B351 Tinea unguium: Secondary | ICD-10-CM | POA: Insufficient documentation

## 2023-03-20 DIAGNOSIS — R198 Other specified symptoms and signs involving the digestive system and abdomen: Secondary | ICD-10-CM | POA: Diagnosis not present

## 2023-03-20 MED ORDER — TERBINAFINE HCL 250 MG PO TABS
250.0000 mg | ORAL_TABLET | Freq: Every day | ORAL | 0 refills | Status: DC
Start: 2023-03-20 — End: 2023-04-15

## 2023-03-20 NOTE — Assessment & Plan Note (Signed)
Patient tried over-the-counter treatments without good relief.  Will do terbinafine therapy milligrams daily up to 3 months she will need a 1 month lab only follow-up to check hepatic function patient was warned not to drink any alcohol

## 2023-03-20 NOTE — Assessment & Plan Note (Signed)
History of chronic constipation.  Has not been evaluated by GI in the past.  Had ultrasound of abdomen limited it was to rule out hernia.  Will do fecal calprotectin along with sed rate and basic labs inclusive of H. pylori.  If all this is negative consider referral to GI most likely IBS can also consider Bentyl if needed

## 2023-03-20 NOTE — Addendum Note (Signed)
Addended by: Alvina Chou on: 03/20/2023 03:15 PM   Modules accepted: Orders

## 2023-03-20 NOTE — Patient Instructions (Signed)
Nice to see you today I will be in touch with the labs once I have them You need a lab visit in 1 month to recheck liver functions

## 2023-03-20 NOTE — Progress Notes (Signed)
Established Patient Office Visit  Subjective   Patient ID: Karen Frazier, female    DOB: [birth year]  Age: 42 y.o. MRN: [id number]  Chief Complaint  Patient presents with   GI Problem    Pt complains of cramping, diarrhea, stomach, nausea, all symptoms comes and goes.  Not sure when this started. Pt states she has ongoing stomach issues.    Nail Problem    On both feet. Mostly right big toe. Pt states cream is not working.     GI Problem The primary symptoms include abdominal pain, nausea and diarrhea. Primary symptoms do not include fever, vomiting, melena or dysuria.  The illness is also significant for constipation. The illness does not include chills.     Gi problem: states that she has had stomach issues for 6-8 years. States that it is normally major constipation. States that the last few months it has been constipation, diarrhea, and cramping. States that she was exposed to someone that had h pylori. Does not feel like heart burn. Does not correlate with eating. States that exposure was over several months. States that she has not been seen by GI specialist. No CT scan but did do a [place] in the past for possible hernia. No blood in the stool. States that she will cycle for a couple days and then go from diarrhea. States that she is using pro and pre biotic, ginger supplement, and magnesium daily.   Little toe on bilateral. Great toe on right, middle one on the right foot. States that they are brittle and break. States that the little one has fallen off. States that she has used otc cream over the past year and has been ineffective. States that her daughter had similar things and had to be treated with oral medications    Review of Systems  Constitutional:  Negative for chills and fever.  Respiratory:  Negative for shortness of breath.   Cardiovascular:  Negative for chest pain.  Gastrointestinal:  Positive for abdominal pain, constipation, diarrhea and nausea. Negative for blood in  stool, melena and vomiting.  Genitourinary:  Negative for dysuria and hematuria.      Objective:     BP 118/88   Pulse 92   Temp 98.5 F (36.9 C) (Oral)   Ht 5\' 4"  (1.626 m)   Wt 161 lb 6.4 oz (73.2 kg)   LMP 07/20/2020   SpO2 98%   BMI 27.70 kg/m    Physical Exam Vitals and nursing note reviewed.  Constitutional:      Appearance: Normal appearance.  Cardiovascular:     Rate and Rhythm: Normal rate and regular rhythm.     Heart sounds: Normal heart sounds.  Pulmonary:     Effort: Pulmonary effort is normal.     Breath sounds: Normal breath sounds.  Abdominal:     General: Bowel sounds are normal. There is no distension.     Palpations: There is no mass.     Tenderness: There is no abdominal tenderness.     Hernia: No hernia is present.  Feet:     Right foot:     Toenail Condition: Right toenails are abnormally thick. Fungal disease present.    Left foot:     Toenail Condition: Fungal disease present. Neurological:     Mental Status: She is alert.      No results found for any visits on 03/20/23.    The 10-year ASCVD risk score (Arnett DK, et al., 2019) is: 0.2%  Assessment & Plan:   Problem List Items Addressed This Visit       Musculoskeletal and Integument   Toenail fungus    Patient tried over-the-counter treatments without good relief.  Will do terbinafine therapy milligrams daily up to 3 months she will need a 1 month lab only follow-up to check hepatic function patient was warned not to drink any alcohol      Relevant Medications   terbinafine (LAMISIL) 250 MG tablet   Other Relevant Orders   Hepatic function panel     Other   Alternating constipation and diarrhea    History of chronic constipation.  Has not been evaluated by GI in the past.  Had ultrasound of abdomen limited it was to rule out hernia.  Will do fecal calprotectin along with sed rate and basic labs inclusive of H. pylori.  If all this is negative consider referral to GI  most likely IBS can also consider Bentyl if needed      Relevant Orders   CALPROTECTIN   Abdominal cramping - Primary    Likely secondary to IBS.  Will check basic labs consider GI referral      Relevant Orders   Hepatic function panel   CBC   Sedimentation rate   H. pylori breath test    Return in about 4 weeks (around 04/17/2023) for Lab only appt.    Audria Nine, NP

## 2023-03-20 NOTE — Assessment & Plan Note (Signed)
Likely secondary to IBS.  Will check basic labs consider GI referral

## 2023-03-21 LAB — CBC
HCT: 37.5 % (ref 36.0–46.0)
Hemoglobin: 12.2 g/dL (ref 12.0–15.0)
MCHC: 32.5 g/dL (ref 30.0–36.0)
MCV: 97.2 fL (ref 78.0–100.0)
Platelets: 344 10*3/uL (ref 150.0–400.0)
RBC: 3.85 Mil/uL — ABNORMAL LOW (ref 3.87–5.11)
RDW: 13.5 % (ref 11.5–15.5)
WBC: 6.6 10*3/uL (ref 4.0–10.5)

## 2023-03-21 LAB — HEPATIC FUNCTION PANEL
ALT: 21 U/L (ref 0–35)
AST: 19 U/L (ref 0–37)
Albumin: 4.5 g/dL (ref 3.5–5.2)
Alkaline Phosphatase: 45 U/L (ref 39–117)
Bilirubin, Direct: 0.1 mg/dL (ref 0.0–0.3)
Total Bilirubin: 0.6 mg/dL (ref 0.2–1.2)
Total Protein: 7.1 g/dL (ref 6.0–8.3)

## 2023-03-21 LAB — SEDIMENTATION RATE: Sed Rate: 3 mm/h (ref 0–20)

## 2023-03-21 LAB — H. PYLORI BREATH TEST: H. pylori Breath Test: NOT DETECTED

## 2023-03-25 ENCOUNTER — Other Ambulatory Visit: Payer: Self-pay | Admitting: Radiology

## 2023-03-25 DIAGNOSIS — R198 Other specified symptoms and signs involving the digestive system and abdomen: Secondary | ICD-10-CM

## 2023-03-30 LAB — CALPROTECTIN: Calprotectin: 7 ug/g

## 2023-04-01 ENCOUNTER — Encounter: Payer: Self-pay | Admitting: Nurse Practitioner

## 2023-04-01 DIAGNOSIS — K5904 Chronic idiopathic constipation: Secondary | ICD-10-CM

## 2023-04-01 DIAGNOSIS — R198 Other specified symptoms and signs involving the digestive system and abdomen: Secondary | ICD-10-CM

## 2023-04-02 DIAGNOSIS — J3089 Other allergic rhinitis: Secondary | ICD-10-CM | POA: Diagnosis not present

## 2023-04-02 DIAGNOSIS — J3081 Allergic rhinitis due to animal (cat) (dog) hair and dander: Secondary | ICD-10-CM | POA: Diagnosis not present

## 2023-04-02 DIAGNOSIS — J301 Allergic rhinitis due to pollen: Secondary | ICD-10-CM | POA: Diagnosis not present

## 2023-04-04 ENCOUNTER — Other Ambulatory Visit (HOSPITAL_COMMUNITY): Payer: Self-pay

## 2023-04-04 ENCOUNTER — Telehealth: Payer: Self-pay | Admitting: Pharmacy Technician

## 2023-04-04 DIAGNOSIS — B351 Tinea unguium: Secondary | ICD-10-CM

## 2023-04-04 NOTE — Telephone Encounter (Signed)
Pharmacy Patient Advocate Encounter   Received notification from CoverMyMeds that prior authorization for Terbinafine HCl 250MG  tablets is required/requested.   Insurance verification completed.   The patient is insured through [facility] .   Per test claim: PA required; PA submitted to [facility] via CoverMyMeds Key/confirmation #/[facility] Status is pending

## 2023-04-09 ENCOUNTER — Other Ambulatory Visit: Payer: Self-pay | Admitting: Nurse Practitioner

## 2023-04-09 DIAGNOSIS — B351 Tinea unguium: Secondary | ICD-10-CM

## 2023-04-09 DIAGNOSIS — Z79899 Other long term (current) drug therapy: Secondary | ICD-10-CM

## 2023-04-09 NOTE — Telephone Encounter (Signed)
Pharmacy Patient Advocate Encounter  Received notification from [employer] [facility] that Prior Authorization for Terbinafine tablets has been DENIED.  Full denial letter will be uploaded to the media tab. See denial reason below.  Your plan only covers this drug when it is used for certain health conditions. Covered uses are A) a fungal infection of the nail and you had a test to confirm the fungus, B) a fungal infection of the scalp, and C) a fungal infection of the body or groin. Your plan does not cover this drug for your health condition that your doctor told us you have. For this drug, you may have to meet other criteria.   PA #/Case ID/Reference #: [id number]   Please be advised we currently do not have a Pharmacist to review denials, therefore you will need to process appeals accordingly as needed. Thanks for your support at this time. Contact for appeals are as follows: Phone: [phone], Fax: [phone]

## 2023-04-11 NOTE — Telephone Encounter (Signed)
Sent pt mychart message in regards to the denied PA for Terbinafine.

## 2023-04-15 ENCOUNTER — Encounter: Payer: Self-pay | Admitting: Nurse Practitioner

## 2023-04-15 MED ORDER — TERBINAFINE HCL 250 MG PO TABS
250.0000 mg | ORAL_TABLET | Freq: Every day | ORAL | 0 refills | Status: DC
Start: 2023-04-15 — End: 2023-05-20

## 2023-04-15 NOTE — Addendum Note (Signed)
Addended by: Eden Emms on: 04/15/2023 11:31 AM   Modules accepted: Orders

## 2023-04-15 NOTE — Telephone Encounter (Signed)
Refill provided

## 2023-04-16 DIAGNOSIS — J301 Allergic rhinitis due to pollen: Secondary | ICD-10-CM | POA: Diagnosis not present

## 2023-04-16 DIAGNOSIS — J3089 Other allergic rhinitis: Secondary | ICD-10-CM | POA: Diagnosis not present

## 2023-04-16 DIAGNOSIS — J3081 Allergic rhinitis due to animal (cat) (dog) hair and dander: Secondary | ICD-10-CM | POA: Diagnosis not present

## 2023-04-17 ENCOUNTER — Other Ambulatory Visit (INDEPENDENT_AMBULATORY_CARE_PROVIDER_SITE_OTHER): Payer: BC Managed Care – PPO

## 2023-04-17 ENCOUNTER — Other Ambulatory Visit: Payer: BC Managed Care – PPO

## 2023-04-17 DIAGNOSIS — B351 Tinea unguium: Secondary | ICD-10-CM | POA: Diagnosis not present

## 2023-04-17 DIAGNOSIS — Z79899 Other long term (current) drug therapy: Secondary | ICD-10-CM

## 2023-04-17 LAB — HEPATIC FUNCTION PANEL
ALT: 13 U/L (ref 0–35)
AST: 17 U/L (ref 0–37)
Albumin: 4.7 g/dL (ref 3.5–5.2)
Alkaline Phosphatase: 47 U/L (ref 39–117)
Bilirubin, Direct: 0.1 mg/dL (ref 0.0–0.3)
Total Bilirubin: 0.5 mg/dL (ref 0.2–1.2)
Total Protein: 7.3 g/dL (ref 6.0–8.3)

## 2023-04-24 DIAGNOSIS — J3081 Allergic rhinitis due to animal (cat) (dog) hair and dander: Secondary | ICD-10-CM | POA: Diagnosis not present

## 2023-04-24 DIAGNOSIS — J301 Allergic rhinitis due to pollen: Secondary | ICD-10-CM | POA: Diagnosis not present

## 2023-04-25 DIAGNOSIS — J3089 Other allergic rhinitis: Secondary | ICD-10-CM | POA: Diagnosis not present

## 2023-04-30 DIAGNOSIS — J3089 Other allergic rhinitis: Secondary | ICD-10-CM | POA: Diagnosis not present

## 2023-04-30 DIAGNOSIS — J3081 Allergic rhinitis due to animal (cat) (dog) hair and dander: Secondary | ICD-10-CM | POA: Diagnosis not present

## 2023-04-30 DIAGNOSIS — J301 Allergic rhinitis due to pollen: Secondary | ICD-10-CM | POA: Diagnosis not present

## 2023-05-06 ENCOUNTER — Encounter: Payer: Self-pay | Admitting: Gastroenterology

## 2023-05-07 DIAGNOSIS — J3081 Allergic rhinitis due to animal (cat) (dog) hair and dander: Secondary | ICD-10-CM | POA: Diagnosis not present

## 2023-05-07 DIAGNOSIS — J3089 Other allergic rhinitis: Secondary | ICD-10-CM | POA: Diagnosis not present

## 2023-05-07 DIAGNOSIS — J301 Allergic rhinitis due to pollen: Secondary | ICD-10-CM | POA: Diagnosis not present

## 2023-05-20 ENCOUNTER — Other Ambulatory Visit: Payer: Self-pay | Admitting: Nurse Practitioner

## 2023-05-20 DIAGNOSIS — B351 Tinea unguium: Secondary | ICD-10-CM

## 2023-05-20 NOTE — Telephone Encounter (Signed)
Patient had lab only appointment on 04/17/23

## 2023-05-21 DIAGNOSIS — J301 Allergic rhinitis due to pollen: Secondary | ICD-10-CM | POA: Diagnosis not present

## 2023-05-21 DIAGNOSIS — J3089 Other allergic rhinitis: Secondary | ICD-10-CM | POA: Diagnosis not present

## 2023-05-21 DIAGNOSIS — J3081 Allergic rhinitis due to animal (cat) (dog) hair and dander: Secondary | ICD-10-CM | POA: Diagnosis not present

## 2023-06-04 DIAGNOSIS — J301 Allergic rhinitis due to pollen: Secondary | ICD-10-CM | POA: Diagnosis not present

## 2023-06-04 DIAGNOSIS — J3081 Allergic rhinitis due to animal (cat) (dog) hair and dander: Secondary | ICD-10-CM | POA: Diagnosis not present

## 2023-06-04 DIAGNOSIS — J3089 Other allergic rhinitis: Secondary | ICD-10-CM | POA: Diagnosis not present

## 2023-06-07 ENCOUNTER — Ambulatory Visit: Payer: BC Managed Care – PPO | Admitting: Gastroenterology

## 2023-06-07 ENCOUNTER — Encounter: Payer: Self-pay | Admitting: Gastroenterology

## 2023-06-07 VITALS — BP 120/60 | Ht 64.0 in | Wt 154.8 lb

## 2023-06-07 DIAGNOSIS — R109 Unspecified abdominal pain: Secondary | ICD-10-CM

## 2023-06-07 DIAGNOSIS — R140 Abdominal distension (gaseous): Secondary | ICD-10-CM

## 2023-06-07 DIAGNOSIS — K5904 Chronic idiopathic constipation: Secondary | ICD-10-CM | POA: Diagnosis not present

## 2023-06-07 DIAGNOSIS — R143 Flatulence: Secondary | ICD-10-CM

## 2023-06-07 NOTE — Progress Notes (Addendum)
Chief Complaint: Chronic constipation  HPI: Patient is a 42 year old female patient that presents as a new patient with medical history of asthma, chronic constipation, and hysterectomy who was referred to me by Eden Emms, NP on 04/01/2023 for a complaint of chronic constipation.  On 10/2 patient saw Mordecai Maes NP for complaint of diarrhea, constipation and abdominal pain. She reported she was exposed to someone with h pylori, so patient tested and negative for H.pylori. Fecal cal protectin ordered and negative. Sed rate negative. GI referral placed.  Interval History  Patient presents with main complaint of chronic constipation with gas and bloat. She reports she has had this issue for several years. She has tried and failed OTC fiber, Probiotics, Magnesium, Miralax multiple times a day, and Linzess (multiple strengths). The medications were either ineffective or made her feel unwell with nausea and abdominal cramping. She is currently taking Ducolax twice weekly. She reports she typically has a bowel movement once or twice a week. She reports she will only have small amount of semi formed stool, not emptying completely. No rectal bleeding. She will experience bloating and discomfort when she doesn't have bowel movement, but after she defecates the symptoms improve. No nausea or vomiting. She has also tried cutting out gluten, reducing diary, and limits red meat which helps some with bloating only. Denies GERD or dysphagia. She has never had colonoscopy. No known family history of CA or gastrointestinal issues.  Socially drinks, 2 per week on average. Non smoker.  Wt Readings from Last 3 Encounters:  06/07/23 154 lb 12.8 oz (70.2 kg)  03/20/23 161 lb 6.4 oz (73.2 kg)  08/01/22 155 lb (70.3 kg)    Past Medical History:  Diagnosis Date   Abnormal uterine bleeding (AUB)    Acute superficial venous thrombosis of right lower extremity 2016   Anemia    Asthma    Eczema    Environmental  allergies    GERD (gastroesophageal reflux disease)    History of chicken pox    Seasonal allergies    Past Surgical History:  Procedure Laterality Date   CYSTOSCOPY N/A 07/27/2020   Procedure: CYSTOSCOPY;  Surgeon: Rande Brunt, MD;  Location: [facility];  Service: Gynecology;  Laterality: N/A;   NO PAST SURGERIES     VAGINAL HYSTERECTOMY Bilateral 07/27/2020   Procedure: HYSTERECTOMY VAGINALw/ Bilateral salpingectomy;  Surgeon: Rande Brunt, MD;  Location: [facility];  Service: Gynecology;  Laterality: Bilateral;   Allergies as of 06/07/2023   (No Active Allergies)   Family History  Problem Relation Age of Onset   Hyperlipidemia Father        Living   Hypertension Father    Diabetes Mother        Living   Hypertension Mother    Allergy (severe) Mother        Alpha-gal   Other Mother        IVP Dye   Heart disease Maternal Grandfather    COPD Paternal Grandfather    Healthy Sister        x1   Allergies Daughter        x1   Healthy Daughter        x2   Diabetes Maternal Aunt    Hyperlipidemia Maternal Aunt    Hypertension Maternal Aunt    Alcohol abuse Maternal Uncle    Heart attack Maternal Uncle    Hyperlipidemia Maternal Uncle    Hypertension Maternal Uncle  Review of Systems:    Constitutional: No weight loss, fever, chills, weakness or fatigue HEENT: Eyes: No change in vision               Ears, Nose, Throat:  No change in hearing or congestion Skin: No rash or itching Cardiovascular: No chest pain, chest pressure or palpitations   Respiratory: No SOB or cough Gastrointestinal: See HPI and otherwise negative Genitourinary: No dysuria or change in urinary frequency Neurological: No headache, dizziness or syncope Musculoskeletal: No new muscle or joint pain Hematologic: No bleeding or bruising Psychiatric: No history of depression or anxiety   Physical Exam:  Vital signs: Ht 5\' 4"  (1.626 m)   Wt 154 lb  12.8 oz (70.2 kg)   LMP 07/20/2020   BMI 26.57 kg/m   Constitutional:Pleasant Caucasian female appears to be in NAD, Well developed, Well nourished, alert and cooperative Neck:  Supple Throat: Oral cavity and pharynx without inflammation, swelling or lesion.  Respiratory: Respirations even and unlabored. Lungs clear to auscultation bilaterally.   No wheezes, crackles, or rhonchi.  Cardiovascular: Normal S1, S2. Regular rate and rhythm. No peripheral edema, cyanosis or pallor.  Gastrointestinal:  Soft, nondistended, nontender. No rebound or guarding. Hypoactive bowel sounds. No appreciable masses or hepatomegaly. Rectal:  Not performed.  Skin:   Dry and intact without significant lesions or rashes. Psychiatric: Oriented to person, place and time. Demonstrates good judgement and reason without abnormal affect or behaviors.  RELEVANT LABS AND IMAGING: CBC    Component Value Date/Time   WBC 6.6 03/20/2023 1515   RBC 3.85 (L) 03/20/2023 1515   HGB 12.2 03/20/2023 1515   HGB 12.4 07/03/2021 1330   HCT 37.5 03/20/2023 1515   HCT 36.5 07/03/2021 1330   PLT 344.0 03/20/2023 1515   PLT 380 07/03/2021 1330   MCV 97.2 03/20/2023 1515   MCV 92 07/03/2021 1330   MCH 31.2 07/03/2021 1330   MCH 31.1 07/25/2020 0919   MCHC 32.5 03/20/2023 1515   RDW 13.5 03/20/2023 1515   RDW 12.4 07/03/2021 1330   LYMPHSABS 2.5 07/03/2021 1330   EOSABS 0.1 07/03/2021 1330   BASOSABS 0.0 07/03/2021 1330   CMP     Component Value Date/Time   NA 139 08/16/2022 1357   NA 141 07/03/2021 1330   K 4.1 08/16/2022 1357   CL 105 08/16/2022 1357   CO2 25 08/16/2022 1357   GLUCOSE 87 08/16/2022 1357   BUN 9 08/16/2022 1357   BUN 10 07/03/2021 1330   CREATININE 0.82 08/16/2022 1357   CALCIUM 10.0 08/16/2022 1357   PROT 7.3 04/17/2023 0755   PROT 7.2 07/03/2021 1330   ALBUMIN 4.7 04/17/2023 0755   ALBUMIN 5.1 (H) 07/03/2021 1330   AST 17 04/17/2023 0755   ALT 13 04/17/2023 0755   ALKPHOS 47 04/17/2023 0755    BILITOT 0.5 04/17/2023 0755   BILITOT 0.5 07/03/2021 1330   GFRNONAA >60 07/25/2020 0919   GFRAA 125 12/17/2018 0906   04/17/2023 labs show: normal hepatic function 03/25/2023 Fecal calprotectin 7 03/20/2023 labs show: H. Pylori breath test negative, SED rate 3, wbc 6.6, plts 344, hgb 12.2 08/16/2022 labs show: TSH 1.59  01/23/2021 US Abdomen IMPRESSION: No findings to suggest umbilical hernia. No other focal abnormality is noted.  Assessment: Encounter Diagnoses  Name Primary?   Chronic idiopathic constipation Yes   Bloating    Abdominal discomfort    Flatulence   42 year old female patient that presents with chronic constipation, gas, and bloat. She  has tried and failed high fiber diet, cutting out certain foods, OTC laxatives such as Miralax, and pro secretory agent Linzess without resolution in symptoms. We discussed testing for Sibo, she would like to proceed. Then we will trial Trulance 3mg  po daily along with OTC IBgard before meals to see if she responds better to this medication. Per records TSH normal, fecal cal protectin normal, h pylori negative. Will give handout on low fodmap as well.  Plan: -order Sibo test  -Hand out on Low FOD map pamphlet -Samples of IBgard before meals -samples of Trulance 3mg  po daily, if no improvement Jona Zappone consider Ibsrela -Follow up with Dr. Barron Alvine in 3-4 months.  Synai Prettyman, FNP-C [facility] 06/07/2023, 8:27 AM  Cc: Eden Emms, NP

## 2023-06-07 NOTE — Patient Instructions (Signed)
We have given you samples of the following medication to take:  Trulance-  If Trulance works for you contact office and we will send prescription.   Medication Samples have been provided to the patient.  Drug name: Trulance       Strength: 3 mg         Qty: 3 (boxes)  LOT: [id number]  Exp.Date: 09/2024  Dosing instructions: Take 1 pill by mouth once daily.  The patient has been instructed regarding the correct time, dose, and frequency of taking this medication, including desired effects and most common side effects.   Please purchase the following medications over the counter and take as directed: Ibgard - Take 1 capsule by mouth three times daily before each meal. (Samples also given)  You have been given a testing kit to check for small intestine bacterial overgrowth (SIBO) which is completed by a company named [facility]. Make sure to return your test in the mail using the return mailing label given to you along with the kit. The test order, your demographic and insurance information have all already been sent to the company. [facility] will collect an upfront charge of $99.74 for commercial insurance plans and $209.74 if you are paying cash. Make sure to discuss with [facility] PRIOR to having the test to see if they have gotten information from your insurance company as to how much your testing will cost out of pocket, if any. Please contact [facility] at phone number [phone] to get instructions regarding how to perform the test as our office is unable to give specific testing instructions.  See Fod Map Diet Handout.   Due to recent changes in healthcare laws, you may see the results of your imaging and laboratory studies on MyChart before your provider has had a chance to review them.  We understand that in some cases there may be results that are confusing or concerning to you. Not all laboratory results come back in the same time frame and the provider may be waiting  for multiple results in order to interpret others.  Please give [place] 48 hours in order for your provider to thoroughly review all the results before contacting the office for clarification of your results.   Thank you for choosing me and [facility].  Deanna May NP

## 2023-06-10 NOTE — Progress Notes (Signed)
Agree with the assessment and plan as outlined by [facility], NP.  Xitlali Kastens, DO, [facility]

## 2023-06-25 DIAGNOSIS — J3081 Allergic rhinitis due to animal (cat) (dog) hair and dander: Secondary | ICD-10-CM | POA: Diagnosis not present

## 2023-06-25 DIAGNOSIS — J301 Allergic rhinitis due to pollen: Secondary | ICD-10-CM | POA: Diagnosis not present

## 2023-06-25 DIAGNOSIS — J3089 Other allergic rhinitis: Secondary | ICD-10-CM | POA: Diagnosis not present

## 2023-07-02 ENCOUNTER — Telehealth: Payer: BC Managed Care – PPO | Admitting: Physician Assistant

## 2023-07-02 DIAGNOSIS — R3989 Other symptoms and signs involving the genitourinary system: Secondary | ICD-10-CM

## 2023-07-02 MED ORDER — CEPHALEXIN 500 MG PO CAPS: 500.0000 mg | ORAL_CAPSULE | Freq: Two times a day (BID) | ORAL | 0 refills | Status: AC

## 2023-07-02 NOTE — Progress Notes (Signed)
 I have spent 5 minutes in review of e-visit questionnaire, review and updating patient chart, medical decision making and response to patient.   Piedad Climes, PA-C

## 2023-07-02 NOTE — Progress Notes (Signed)

## 2023-07-17 DIAGNOSIS — J3089 Other allergic rhinitis: Secondary | ICD-10-CM | POA: Diagnosis not present

## 2023-07-17 DIAGNOSIS — J3081 Allergic rhinitis due to animal (cat) (dog) hair and dander: Secondary | ICD-10-CM | POA: Diagnosis not present

## 2023-07-17 DIAGNOSIS — J301 Allergic rhinitis due to pollen: Secondary | ICD-10-CM | POA: Diagnosis not present

## 2023-07-23 DIAGNOSIS — J301 Allergic rhinitis due to pollen: Secondary | ICD-10-CM | POA: Diagnosis not present

## 2023-07-23 DIAGNOSIS — J3081 Allergic rhinitis due to animal (cat) (dog) hair and dander: Secondary | ICD-10-CM | POA: Diagnosis not present

## 2023-07-23 DIAGNOSIS — J3089 Other allergic rhinitis: Secondary | ICD-10-CM | POA: Diagnosis not present

## 2023-08-06 DIAGNOSIS — J3089 Other allergic rhinitis: Secondary | ICD-10-CM | POA: Diagnosis not present

## 2023-08-06 DIAGNOSIS — J301 Allergic rhinitis due to pollen: Secondary | ICD-10-CM | POA: Diagnosis not present

## 2023-08-06 DIAGNOSIS — J3081 Allergic rhinitis due to animal (cat) (dog) hair and dander: Secondary | ICD-10-CM | POA: Diagnosis not present

## 2023-08-13 DIAGNOSIS — J301 Allergic rhinitis due to pollen: Secondary | ICD-10-CM | POA: Diagnosis not present

## 2023-08-13 DIAGNOSIS — J3081 Allergic rhinitis due to animal (cat) (dog) hair and dander: Secondary | ICD-10-CM | POA: Diagnosis not present

## 2023-08-13 DIAGNOSIS — J3089 Other allergic rhinitis: Secondary | ICD-10-CM | POA: Diagnosis not present

## 2023-08-14 ENCOUNTER — Other Ambulatory Visit: Payer: BC Managed Care – PPO

## 2023-08-20 ENCOUNTER — Ambulatory Visit
Admission: RE | Admit: 2023-08-20 | Discharge: 2023-08-20 | Disposition: A | Discharge status: Home / Self Care | Payer: BC Managed Care – PPO | Source: Ambulatory Visit | Attending: Obstetrics

## 2023-08-20 ENCOUNTER — Ambulatory Visit
Admission: RE | Admit: 2023-08-20 | Discharge: 2023-08-20 | Disposition: A | Discharge status: Home / Self Care | Payer: BC Managed Care – PPO | Source: Ambulatory Visit | Attending: Obstetrics | Admitting: Obstetrics

## 2023-08-20 ENCOUNTER — Other Ambulatory Visit: Payer: Self-pay | Admitting: Obstetrics

## 2023-08-20 DIAGNOSIS — N6315 Unspecified lump in the right breast, overlapping quadrants: Secondary | ICD-10-CM | POA: Diagnosis not present

## 2023-08-20 DIAGNOSIS — N6310 Unspecified lump in the right breast, unspecified quadrant: Secondary | ICD-10-CM

## 2023-08-20 DIAGNOSIS — N6311 Unspecified lump in the right breast, upper outer quadrant: Secondary | ICD-10-CM | POA: Diagnosis not present

## 2023-08-20 DIAGNOSIS — R928 Other abnormal and inconclusive findings on diagnostic imaging of breast: Secondary | ICD-10-CM

## 2023-08-27 DIAGNOSIS — J3081 Allergic rhinitis due to animal (cat) (dog) hair and dander: Secondary | ICD-10-CM | POA: Diagnosis not present

## 2023-08-27 DIAGNOSIS — J3089 Other allergic rhinitis: Secondary | ICD-10-CM | POA: Diagnosis not present

## 2023-08-27 DIAGNOSIS — J301 Allergic rhinitis due to pollen: Secondary | ICD-10-CM | POA: Diagnosis not present

## 2023-09-03 DIAGNOSIS — J3081 Allergic rhinitis due to animal (cat) (dog) hair and dander: Secondary | ICD-10-CM | POA: Diagnosis not present

## 2023-09-03 DIAGNOSIS — J301 Allergic rhinitis due to pollen: Secondary | ICD-10-CM | POA: Diagnosis not present

## 2023-09-03 DIAGNOSIS — J3089 Other allergic rhinitis: Secondary | ICD-10-CM | POA: Diagnosis not present

## 2023-09-17 DIAGNOSIS — J301 Allergic rhinitis due to pollen: Secondary | ICD-10-CM | POA: Diagnosis not present

## 2023-09-17 DIAGNOSIS — J3089 Other allergic rhinitis: Secondary | ICD-10-CM | POA: Diagnosis not present

## 2023-09-17 DIAGNOSIS — J3081 Allergic rhinitis due to animal (cat) (dog) hair and dander: Secondary | ICD-10-CM | POA: Diagnosis not present

## 2023-09-24 DIAGNOSIS — J301 Allergic rhinitis due to pollen: Secondary | ICD-10-CM | POA: Diagnosis not present

## 2023-09-24 DIAGNOSIS — J3081 Allergic rhinitis due to animal (cat) (dog) hair and dander: Secondary | ICD-10-CM | POA: Diagnosis not present

## 2023-09-24 DIAGNOSIS — J3089 Other allergic rhinitis: Secondary | ICD-10-CM | POA: Diagnosis not present

## 2023-10-01 ENCOUNTER — Encounter: Payer: Self-pay | Admitting: Nurse Practitioner

## 2023-10-01 ENCOUNTER — Ambulatory Visit (INDEPENDENT_AMBULATORY_CARE_PROVIDER_SITE_OTHER): Payer: BC Managed Care – PPO | Admitting: Nurse Practitioner

## 2023-10-01 VITALS — BP 116/80 | HR 84 | Temp 98.3°F | Ht 64.0 in | Wt 156.4 lb

## 2023-10-01 DIAGNOSIS — Z13220 Encounter for screening for lipoid disorders: Secondary | ICD-10-CM | POA: Diagnosis not present

## 2023-10-01 DIAGNOSIS — Z0000 Encounter for general adult medical examination without abnormal findings: Secondary | ICD-10-CM

## 2023-10-01 DIAGNOSIS — E663 Overweight: Secondary | ICD-10-CM

## 2023-10-01 DIAGNOSIS — Z131 Encounter for screening for diabetes mellitus: Secondary | ICD-10-CM | POA: Diagnosis not present

## 2023-10-01 DIAGNOSIS — Z90710 Acquired absence of both cervix and uterus: Secondary | ICD-10-CM

## 2023-10-01 DIAGNOSIS — K5904 Chronic idiopathic constipation: Secondary | ICD-10-CM | POA: Diagnosis not present

## 2023-10-01 DIAGNOSIS — E559 Vitamin D deficiency, unspecified: Secondary | ICD-10-CM | POA: Insufficient documentation

## 2023-10-01 DIAGNOSIS — J4520 Mild intermittent asthma, uncomplicated: Secondary | ICD-10-CM

## 2023-10-01 LAB — COMPREHENSIVE METABOLIC PANEL WITH GFR
ALT: 14 U/L (ref 0–35)
AST: 15 U/L (ref 0–37)
Albumin: 4.5 g/dL (ref 3.5–5.2)
Alkaline Phosphatase: 51 U/L (ref 39–117)
BUN: 15 mg/dL (ref 6–23)
CO2: 27 meq/L (ref 19–32)
Calcium: 9.1 mg/dL (ref 8.4–10.5)
Chloride: 107 meq/L (ref 96–112)
Creatinine, Ser: 0.8 mg/dL (ref 0.40–1.20)
GFR: 90.45 mL/min (ref >60.00)
Glucose, Bld: 94 mg/dL (ref 70–99)
Potassium: 4.3 meq/L (ref 3.5–5.1)
Sodium: 139 meq/L (ref 135–145)
Total Bilirubin: 0.5 mg/dL (ref 0.2–1.2)
Total Protein: 6.5 g/dL (ref 6.0–8.3)

## 2023-10-01 LAB — LIPID PANEL
Cholesterol: 204 mg/dL — ABNORMAL HIGH (ref 0–200)
HDL: 87.8 mg/dL (ref >39.00)
LDL Cholesterol: 105 mg/dL — ABNORMAL HIGH (ref 0–99)
NonHDL: 116.11
Total CHOL/HDL Ratio: 2
Triglycerides: 57 mg/dL (ref 0.0–149.0)
VLDL: 11.4 mg/dL (ref 0.0–40.0)

## 2023-10-01 LAB — CBC
HCT: 38.8 % (ref 36.0–46.0)
Hemoglobin: 12.8 g/dL (ref 12.0–15.0)
MCHC: 33 g/dL (ref 30.0–36.0)
MCV: 96.8 fl (ref 78.0–100.0)
Platelets: 329 10*3/uL (ref 150.0–400.0)
RBC: 4.01 Mil/uL (ref 3.87–5.11)
RDW: 13 % (ref 11.5–15.5)
WBC: 5.4 10*3/uL (ref 4.0–10.5)

## 2023-10-01 LAB — TSH: TSH: 1.66 u[IU]/mL (ref 0.35–5.50)

## 2023-10-01 LAB — HEMOGLOBIN A1C: Hgb A1c MFr Bld: 5.7 % (ref 4.6–6.5)

## 2023-10-01 LAB — VITAMIN D 25 HYDROXY (VIT D DEFICIENCY, FRACTURES): VITD: 29.83 ng/mL — ABNORMAL LOW (ref 30.00–100.00)

## 2023-10-01 NOTE — Progress Notes (Signed)
 Established Patient Office Visit  Subjective   Patient ID: Karen Frazier, female    DOB: 12/24/1980  Age: 43 y.o. MRN: [id number]  Chief Complaint  Patient presents with   Annual Exam    Discuss prevnar    HPI  for complete physical and follow up of chronic conditions.   Chronic constipation: states that she did some testing and did a script that failed   Asthma: states that she does not use the inhaler at all and is on singular. Followed by allergy specialist   Immunizations: -Tetanus: Completed in 2016 -Influenza:  refused  -Shingles: too young  -Pneumonia: too young   Diet: Fair diet. She is eating 2 meals a day. She is doing some snacks. She is doing water  Exercise: No regular exercise. She will do 5-6 times a week for an hours 2 days cardio and the rest lifting   Eye exam: lasix. Yearly  Dental exam: Completes semi-annually    Colonoscopy: Completed in  Lung Cancer Screening: Completed in   Pap smear: Dyanna Clark   Mammogram: Diagnosed in 08/20/2023 with repeat in 6 months, September 2025.  Followed by GYN  DEXA  Sleep: going to bed around 830-9 and will get up 3. Does not feel rested. but it is hoping to change        Review of Systems  Constitutional:  Negative for chills and fever.  Respiratory:  Negative for shortness of breath.   Cardiovascular:  Negative for chest pain and leg swelling.  Gastrointestinal:  Positive for constipation and diarrhea. Negative for abdominal pain, blood in stool, nausea and vomiting.       Bm 1-2s a week   Genitourinary:  Negative for dysuria and hematuria.  Neurological:  Negative for tingling and headaches.  Psychiatric/Behavioral:  Negative for hallucinations and suicidal ideas.       Objective:     BP 116/80   Pulse 84   Temp 98.3 F (36.8 C) (Oral)   Ht 5\' 4"  (1.626 m)   Wt 156 lb 6.4 oz (70.9 kg)   LMP 07/20/2020   SpO2 96%   BMI 26.85 kg/m    Physical Exam Vitals and nursing note reviewed.   Constitutional:      Appearance: Normal appearance.  HENT:     Right Ear: Tympanic membrane, ear canal and external ear normal.     Left Ear: Tympanic membrane, ear canal and external ear normal.     Mouth/Throat:     Mouth: Mucous membranes are moist.     Pharynx: Oropharynx is clear.  Eyes:     Extraocular Movements: Extraocular movements intact.     Pupils: Pupils are equal, round, and reactive to light.  Cardiovascular:     Rate and Rhythm: Normal rate and regular rhythm.     Pulses: Normal pulses.     Heart sounds: Normal heart sounds.  Pulmonary:     Effort: Pulmonary effort is normal.     Breath sounds: Normal breath sounds.  Abdominal:     General: Bowel sounds are normal. There is no distension.     Palpations: There is no mass.     Tenderness: There is no abdominal tenderness.     Hernia: No hernia is present.  Musculoskeletal:     Right lower leg: No edema.     Left lower leg: No edema.  Lymphadenopathy:     Cervical: No cervical adenopathy.  Skin:    General: Skin is warm.  Neurological:  General: No focal deficit present.     Mental Status: She is alert.     Deep Tendon Reflexes:     Reflex Scores:      Bicep reflexes are 2+ on the right side and 2+ on the left side.      Patellar reflexes are 2+ on the right side and 2+ on the left side.    Comments: Bilateral upper and lower extremity strength 5/5  Psychiatric:        Mood and Affect: Mood normal.        Behavior: Behavior normal.        Thought Content: Thought content normal.        Judgment: Judgment normal.      No results found for any visits on 10/01/23.    The 10-year ASCVD risk score (Arnett DK, et al., 2019) is: 0.3%    Assessment & Plan:   Problem List Items Addressed This Visit       Respiratory   Allergic asthma   Patient is followed by allergy specialist.  Currently maintained on Singulair and as needed albuterol.  Stable continue medication as prescribed follow-up with  specialist as recommended        Digestive   Chronic idiopathic constipation (Chronic)   Patient was seen and evaluated by GI.  Tried prescription medicine that was ineffective.  Continue following with GI as needed      Relevant Orders   TSH     Other   Visit for preventive health examination - Primary   Discussed age-appropriate musicians and screening exams.  Did review patient's personal, surgical, social, family histories.  Patient is up-to-date on all age-appropriate vaccinations she would like.  Patient is too young for CRC screening.  She is up-to-date on pelvic exams albeit has had a hysterectomy but followed by GYN.  Patient is up-to-date on breast cancer screening with mammograms done by her GYN.  Patient was given information at discharge about preventative healthcare maintenance with anticipatory guidance.  Patient had a form dropped off that needs to be filled out and returned for insurance      Relevant Orders   CBC   Comprehensive metabolic panel with GFR   TSH   S/P hysterectomy   Followed by GYN      Overweight   Patient leads healthy and active lifestyle.  Pending A1c, lipid panel, TSH.  Continue doing healthy lifestyle modifications      Relevant Orders   Hemoglobin A1c   TSH   Lipid panel   Vitamin D deficiency   History of the same.  Pending vitamin D level today      Relevant Orders   VITAMIN D 25 Hydroxy (Vit-D Deficiency, Fractures)   Other Visit Diagnoses       Screening for lipid disorders       Relevant Orders   Lipid panel     Screening for diabetes mellitus       Relevant Orders   Hemoglobin A1c       Return in about 1 year (around 09/30/2024) for CPE and Labs.    Margarie Shay, NP

## 2023-10-01 NOTE — Assessment & Plan Note (Signed)
History of the same.  Pending vitamin D level today

## 2023-10-01 NOTE — Assessment & Plan Note (Signed)
 Discussed age-appropriate musicians and screening exams.  Did review patient's personal, surgical, social, family histories.  Patient is up-to-date on all age-appropriate vaccinations she would like.  Patient is too young for CRC screening.  She is up-to-date on pelvic exams albeit has had a hysterectomy but followed by GYN.  Patient is up-to-date on breast cancer screening with mammograms done by her GYN.  Patient was given information at discharge about preventative healthcare maintenance with anticipatory guidance.  Patient had a form dropped off that needs to be filled out and returned for insurance

## 2023-10-01 NOTE — Assessment & Plan Note (Signed)
 Patient is followed by allergy specialist.  Currently maintained on Singulair and as needed albuterol.  Stable continue medication as prescribed follow-up with specialist as recommended

## 2023-10-01 NOTE — Assessment & Plan Note (Signed)
 Followed by GYN

## 2023-10-01 NOTE — Assessment & Plan Note (Signed)
 Patient leads healthy and active lifestyle.  Pending A1c, lipid panel, TSH.  Continue doing healthy lifestyle modifications

## 2023-10-01 NOTE — Assessment & Plan Note (Signed)
 Patient was seen and evaluated by GI.  Tried prescription medicine that was ineffective.  Continue following with GI as needed

## 2023-10-01 NOTE — Patient Instructions (Signed)
 Nice to see you today I will be in touch with the labs once I have them Follow up with me in 1 year, sooner if you need me

## 2023-10-02 ENCOUNTER — Encounter: Payer: Self-pay | Admitting: Nurse Practitioner

## 2023-10-02 ENCOUNTER — Telehealth: Payer: Self-pay

## 2023-10-02 NOTE — Telephone Encounter (Signed)
 Patient picked up forms.

## 2023-10-02 NOTE — Telephone Encounter (Signed)
 Contacted pt to inform her of the form completed and ready for pick up at front office.

## 2023-10-08 DIAGNOSIS — J3081 Allergic rhinitis due to animal (cat) (dog) hair and dander: Secondary | ICD-10-CM | POA: Diagnosis not present

## 2023-10-08 DIAGNOSIS — J301 Allergic rhinitis due to pollen: Secondary | ICD-10-CM | POA: Diagnosis not present

## 2023-10-08 DIAGNOSIS — J3089 Other allergic rhinitis: Secondary | ICD-10-CM | POA: Diagnosis not present

## 2023-10-09 DIAGNOSIS — F4323 Adjustment disorder with mixed anxiety and depressed mood: Secondary | ICD-10-CM | POA: Diagnosis not present

## 2023-10-23 DIAGNOSIS — F4323 Adjustment disorder with mixed anxiety and depressed mood: Secondary | ICD-10-CM | POA: Diagnosis not present

## 2023-10-23 DIAGNOSIS — J3089 Other allergic rhinitis: Secondary | ICD-10-CM | POA: Diagnosis not present

## 2023-10-23 DIAGNOSIS — J301 Allergic rhinitis due to pollen: Secondary | ICD-10-CM | POA: Diagnosis not present

## 2023-10-23 DIAGNOSIS — J3081 Allergic rhinitis due to animal (cat) (dog) hair and dander: Secondary | ICD-10-CM | POA: Diagnosis not present

## 2023-11-05 DIAGNOSIS — J3089 Other allergic rhinitis: Secondary | ICD-10-CM | POA: Diagnosis not present

## 2023-11-05 DIAGNOSIS — J301 Allergic rhinitis due to pollen: Secondary | ICD-10-CM | POA: Diagnosis not present

## 2023-11-05 DIAGNOSIS — J3081 Allergic rhinitis due to animal (cat) (dog) hair and dander: Secondary | ICD-10-CM | POA: Diagnosis not present

## 2023-11-19 DIAGNOSIS — J3089 Other allergic rhinitis: Secondary | ICD-10-CM | POA: Diagnosis not present

## 2023-11-19 DIAGNOSIS — J301 Allergic rhinitis due to pollen: Secondary | ICD-10-CM | POA: Diagnosis not present

## 2023-11-19 DIAGNOSIS — J3081 Allergic rhinitis due to animal (cat) (dog) hair and dander: Secondary | ICD-10-CM | POA: Diagnosis not present

## 2023-11-27 DIAGNOSIS — J3089 Other allergic rhinitis: Secondary | ICD-10-CM | POA: Diagnosis not present

## 2023-11-27 DIAGNOSIS — J3081 Allergic rhinitis due to animal (cat) (dog) hair and dander: Secondary | ICD-10-CM | POA: Diagnosis not present

## 2023-11-27 DIAGNOSIS — J301 Allergic rhinitis due to pollen: Secondary | ICD-10-CM | POA: Diagnosis not present

## 2023-11-27 DIAGNOSIS — J4520 Mild intermittent asthma, uncomplicated: Secondary | ICD-10-CM | POA: Diagnosis not present

## 2023-12-10 DIAGNOSIS — J301 Allergic rhinitis due to pollen: Secondary | ICD-10-CM | POA: Diagnosis not present

## 2023-12-10 DIAGNOSIS — J3089 Other allergic rhinitis: Secondary | ICD-10-CM | POA: Diagnosis not present

## 2023-12-10 DIAGNOSIS — J3081 Allergic rhinitis due to animal (cat) (dog) hair and dander: Secondary | ICD-10-CM | POA: Diagnosis not present

## 2023-12-24 DIAGNOSIS — J3081 Allergic rhinitis due to animal (cat) (dog) hair and dander: Secondary | ICD-10-CM | POA: Diagnosis not present

## 2023-12-24 DIAGNOSIS — J301 Allergic rhinitis due to pollen: Secondary | ICD-10-CM | POA: Diagnosis not present

## 2023-12-24 DIAGNOSIS — J3089 Other allergic rhinitis: Secondary | ICD-10-CM | POA: Diagnosis not present

## 2024-01-10 ENCOUNTER — Ambulatory Visit: Admitting: Nurse Practitioner

## 2024-01-10 ENCOUNTER — Other Ambulatory Visit

## 2024-01-10 VITALS — BP 120/72 | HR 60 | Temp 98.4°F | Ht 64.0 in | Wt 162.4 lb

## 2024-01-10 DIAGNOSIS — Z01818 Encounter for other preprocedural examination: Secondary | ICD-10-CM | POA: Diagnosis not present

## 2024-01-10 LAB — COMPREHENSIVE METABOLIC PANEL WITH GFR
ALT: 17 U/L (ref 0–35)
AST: 17 U/L (ref 0–37)
Albumin: 4.5 g/dL (ref 3.5–5.2)
Alkaline Phosphatase: 51 U/L (ref 39–117)
BUN: 12 mg/dL (ref 6–23)
CO2: 27 meq/L (ref 19–32)
Calcium: 9 mg/dL (ref 8.4–10.5)
Chloride: 106 meq/L (ref 96–112)
Creatinine, Ser: 0.8 mg/dL (ref 0.40–1.20)
GFR: 90.27 mL/min (ref >60.00)
Glucose, Bld: 89 mg/dL (ref 70–99)
Potassium: 4.6 meq/L (ref 3.5–5.1)
Sodium: 141 meq/L (ref 135–145)
Total Bilirubin: 0.5 mg/dL (ref 0.2–1.2)
Total Protein: 6.7 g/dL (ref 6.0–8.3)

## 2024-01-10 LAB — CBC WITH DIFFERENTIAL/PLATELET
Basophils Absolute: 0 K/uL (ref 0.0–0.1)
Basophils Relative: 0.9 % (ref 0.0–3.0)
Eosinophils Absolute: 0.1 K/uL (ref 0.0–0.7)
Eosinophils Relative: 2.2 % (ref 0.0–5.0)
HCT: 38.2 % (ref 36.0–46.0)
Hemoglobin: 12.5 g/dL (ref 12.0–15.0)
Lymphocytes Relative: 35.8 % (ref 12.0–46.0)
Lymphs Abs: 1.9 K/uL (ref 0.7–4.0)
MCHC: 32.8 g/dL (ref 30.0–36.0)
MCV: 95.7 fl (ref 78.0–100.0)
Monocytes Absolute: 0.4 K/uL (ref 0.1–1.0)
Monocytes Relative: 7.8 % (ref 3.0–12.0)
Neutro Abs: 2.9 K/uL (ref 1.4–7.7)
Neutrophils Relative %: 53.3 % (ref 43.0–77.0)
Platelets: 298 K/uL (ref 150.0–400.0)
RBC: 3.99 Mil/uL (ref 3.87–5.11)
RDW: 13.5 % (ref 11.5–15.5)
WBC: 5.4 K/uL (ref 4.0–10.5)

## 2024-01-10 LAB — PROTIME-INR
INR: 1 ratio (ref 0.8–1.0)
Prothrombin Time: 10.4 s (ref 9.6–13.1)

## 2024-01-10 LAB — APTT: aPTT: 28.5 s (ref 25.4–36.8)

## 2024-01-10 LAB — HEMOGLOBIN A1C: Hgb A1c MFr Bld: 5.6 % (ref 4.6–6.5)

## 2024-01-10 NOTE — Patient Instructions (Signed)
 Nice to see you today Once I have the labs back I will be in touch with you and let you know when I have completed your form I dont need to see you until it is time for your physical again in April

## 2024-01-10 NOTE — Assessment & Plan Note (Signed)
 Patient is scheduled to have elective abdominoplasty, mastopexy, lateral thoracic and abdominal liposuction.  Pending requested labs if normal patient will be cleared for surgery

## 2024-01-10 NOTE — Progress Notes (Signed)
   Acute Office Visit  Subjective:     Patient ID: Karen Frazier, female    DOB: 02-04-1981, 43 y.o.   MRN: [id number]  Chief Complaint  Patient presents with   Medical Clearance    Pt complains of need for clearance for surgery. Ppw due by August 13th.     HPI Patient is in today for surgical clearance for elective procedure with a history of alpha gal, allergic asthma, Chronic constipation  No trouble with anesthesia. No troble with going to sleep or waking  No Personal or family hisotry of malgnant hypertehermia   States that she uses it for exercise. Statse that she has used it once with running. She is also on singlular and with [place] allergy   No history of clotting abnormalities in the family or personally   She is exercising 4-5 times a week with mainly weights and cardio. She is starting to incorporate running   Review of Systems  Constitutional:  Negative for chills and fever.  Respiratory:  Negative for shortness of breath.   Cardiovascular:  Negative for chest pain.  Gastrointestinal:  Positive for constipation. Negative for abdominal pain, diarrhea, nausea and vomiting.       BM every other to 3rd  Genitourinary:  Negative for dysuria and hematuria.  Neurological:  Negative for dizziness and headaches.        Objective:    BP 120/72   Pulse 60   Temp 98.4 F (36.9 C) (Oral)   Ht 5' 4 (1.626 m)   Wt 162 lb 6.4 oz (73.7 kg)   LMP 07/20/2020   SpO2 98%   BMI 27.88 kg/m    Physical Exam Vitals and nursing note reviewed.  Constitutional:      Appearance: Normal appearance.  Cardiovascular:     Rate and Rhythm: Normal rate and regular rhythm.     Heart sounds: Normal heart sounds.  Pulmonary:     Effort: Pulmonary effort is normal.     Breath sounds: Normal breath sounds.  Abdominal:     General: Bowel sounds are normal. There is no distension.     Palpations: There is no mass.     Tenderness: There is no abdominal tenderness.     Hernia: No  hernia is present.  Neurological:     Mental Status: She is alert.     No results found for any visits on 01/10/24.      Assessment & Plan:   Problem List Items Addressed This Visit       Other   Preoperative clearance - Primary   Patient is scheduled to have elective abdominoplasty, mastopexy, lateral thoracic and abdominal liposuction.  Pending requested labs if normal patient will be cleared for surgery      Relevant Orders   Factor 5 assay   CBC with Differential/Platelet   Comprehensive metabolic panel with GFR   Hemoglobin A1c   Protime-INR   APTT    No orders of the defined types were placed in this encounter.   Return in about 9 months (around 10/10/2024) for CPE and Labs.  Adina Crandall, NP

## 2024-01-14 LAB — FACTOR 5 ASSAY: COAG FACTOR V ACTIVITY: 127 %{normal} (ref 65–150)

## 2024-01-15 ENCOUNTER — Ambulatory Visit: Payer: Self-pay | Admitting: Nurse Practitioner

## 2024-01-15 NOTE — Telephone Encounter (Signed)
 Refaxed ppw with labs to number provided on form.  Labs Last read by Jenne Bally at 7:24AM on 01/15/2024

## 2024-01-17 DIAGNOSIS — J3081 Allergic rhinitis due to animal (cat) (dog) hair and dander: Secondary | ICD-10-CM | POA: Diagnosis not present

## 2024-01-17 DIAGNOSIS — J301 Allergic rhinitis due to pollen: Secondary | ICD-10-CM | POA: Diagnosis not present

## 2024-01-17 DIAGNOSIS — J3089 Other allergic rhinitis: Secondary | ICD-10-CM | POA: Diagnosis not present

## 2024-01-27 DIAGNOSIS — Z01419 Encounter for gynecological examination (general) (routine) without abnormal findings: Secondary | ICD-10-CM | POA: Diagnosis not present

## 2024-02-04 DIAGNOSIS — N6311 Unspecified lump in the right breast, upper outer quadrant: Secondary | ICD-10-CM | POA: Diagnosis not present

## 2024-02-04 DIAGNOSIS — N6315 Unspecified lump in the right breast, overlapping quadrants: Secondary | ICD-10-CM | POA: Diagnosis not present

## 2024-02-04 DIAGNOSIS — R928 Other abnormal and inconclusive findings on diagnostic imaging of breast: Secondary | ICD-10-CM | POA: Diagnosis not present

## 2024-02-05 DIAGNOSIS — J301 Allergic rhinitis due to pollen: Secondary | ICD-10-CM | POA: Diagnosis not present

## 2024-02-05 DIAGNOSIS — J3081 Allergic rhinitis due to animal (cat) (dog) hair and dander: Secondary | ICD-10-CM | POA: Diagnosis not present

## 2024-02-05 DIAGNOSIS — J3089 Other allergic rhinitis: Secondary | ICD-10-CM | POA: Diagnosis not present

## 2024-02-19 DIAGNOSIS — J301 Allergic rhinitis due to pollen: Secondary | ICD-10-CM | POA: Diagnosis not present

## 2024-02-19 DIAGNOSIS — J3089 Other allergic rhinitis: Secondary | ICD-10-CM | POA: Diagnosis not present

## 2024-02-19 DIAGNOSIS — J3081 Allergic rhinitis due to animal (cat) (dog) hair and dander: Secondary | ICD-10-CM | POA: Diagnosis not present

## 2024-02-20 DIAGNOSIS — Z1289 Encounter for screening for malignant neoplasm of other sites: Secondary | ICD-10-CM | POA: Diagnosis not present

## 2024-02-20 DIAGNOSIS — Z1239 Encounter for other screening for malignant neoplasm of breast: Secondary | ICD-10-CM | POA: Diagnosis not present

## 2024-02-28 ENCOUNTER — Other Ambulatory Visit

## 2024-02-28 ENCOUNTER — Encounter

## 2024-03-10 DIAGNOSIS — J301 Allergic rhinitis due to pollen: Secondary | ICD-10-CM | POA: Diagnosis not present

## 2024-03-10 DIAGNOSIS — J3089 Other allergic rhinitis: Secondary | ICD-10-CM | POA: Diagnosis not present

## 2024-03-10 DIAGNOSIS — J3081 Allergic rhinitis due to animal (cat) (dog) hair and dander: Secondary | ICD-10-CM | POA: Diagnosis not present

## 2024-03-18 DIAGNOSIS — J301 Allergic rhinitis due to pollen: Secondary | ICD-10-CM | POA: Diagnosis not present

## 2024-03-18 DIAGNOSIS — J3089 Other allergic rhinitis: Secondary | ICD-10-CM | POA: Diagnosis not present

## 2024-03-18 DIAGNOSIS — J3081 Allergic rhinitis due to animal (cat) (dog) hair and dander: Secondary | ICD-10-CM | POA: Diagnosis not present

## 2024-03-24 DIAGNOSIS — J3089 Other allergic rhinitis: Secondary | ICD-10-CM | POA: Diagnosis not present

## 2024-03-24 DIAGNOSIS — J3081 Allergic rhinitis due to animal (cat) (dog) hair and dander: Secondary | ICD-10-CM | POA: Diagnosis not present

## 2024-03-24 DIAGNOSIS — J301 Allergic rhinitis due to pollen: Secondary | ICD-10-CM | POA: Diagnosis not present

## 2024-03-30 DIAGNOSIS — J301 Allergic rhinitis due to pollen: Secondary | ICD-10-CM | POA: Diagnosis not present

## 2024-03-30 DIAGNOSIS — J3089 Other allergic rhinitis: Secondary | ICD-10-CM | POA: Diagnosis not present

## 2024-03-30 DIAGNOSIS — J3081 Allergic rhinitis due to animal (cat) (dog) hair and dander: Secondary | ICD-10-CM | POA: Diagnosis not present

## 2024-04-08 DIAGNOSIS — J301 Allergic rhinitis due to pollen: Secondary | ICD-10-CM | POA: Diagnosis not present

## 2024-04-08 DIAGNOSIS — J3081 Allergic rhinitis due to animal (cat) (dog) hair and dander: Secondary | ICD-10-CM | POA: Diagnosis not present

## 2024-04-09 DIAGNOSIS — J3089 Other allergic rhinitis: Secondary | ICD-10-CM | POA: Diagnosis not present

## 2024-04-15 DIAGNOSIS — J3081 Allergic rhinitis due to animal (cat) (dog) hair and dander: Secondary | ICD-10-CM | POA: Diagnosis not present

## 2024-04-15 DIAGNOSIS — J3089 Other allergic rhinitis: Secondary | ICD-10-CM | POA: Diagnosis not present

## 2024-04-15 DIAGNOSIS — J301 Allergic rhinitis due to pollen: Secondary | ICD-10-CM | POA: Diagnosis not present

## 2024-04-22 DIAGNOSIS — J301 Allergic rhinitis due to pollen: Secondary | ICD-10-CM | POA: Diagnosis not present

## 2024-04-22 DIAGNOSIS — J3081 Allergic rhinitis due to animal (cat) (dog) hair and dander: Secondary | ICD-10-CM | POA: Diagnosis not present

## 2024-04-22 DIAGNOSIS — J3089 Other allergic rhinitis: Secondary | ICD-10-CM | POA: Diagnosis not present

## 2024-05-05 DIAGNOSIS — J301 Allergic rhinitis due to pollen: Secondary | ICD-10-CM | POA: Diagnosis not present

## 2024-05-05 DIAGNOSIS — J3081 Allergic rhinitis due to animal (cat) (dog) hair and dander: Secondary | ICD-10-CM | POA: Diagnosis not present

## 2024-05-05 DIAGNOSIS — J3089 Other allergic rhinitis: Secondary | ICD-10-CM | POA: Diagnosis not present

## 2024-05-12 DIAGNOSIS — J3081 Allergic rhinitis due to animal (cat) (dog) hair and dander: Secondary | ICD-10-CM | POA: Diagnosis not present

## 2024-05-12 DIAGNOSIS — J3089 Other allergic rhinitis: Secondary | ICD-10-CM | POA: Diagnosis not present

## 2024-05-12 DIAGNOSIS — J301 Allergic rhinitis due to pollen: Secondary | ICD-10-CM | POA: Diagnosis not present

## 2024-05-26 DIAGNOSIS — J3081 Allergic rhinitis due to animal (cat) (dog) hair and dander: Secondary | ICD-10-CM | POA: Diagnosis not present

## 2024-05-26 DIAGNOSIS — J301 Allergic rhinitis due to pollen: Secondary | ICD-10-CM | POA: Diagnosis not present

## 2024-05-26 DIAGNOSIS — J3089 Other allergic rhinitis: Secondary | ICD-10-CM | POA: Diagnosis not present

## 2024-06-16 DIAGNOSIS — J3081 Allergic rhinitis due to animal (cat) (dog) hair and dander: Secondary | ICD-10-CM | POA: Diagnosis not present

## 2024-06-16 DIAGNOSIS — J301 Allergic rhinitis due to pollen: Secondary | ICD-10-CM | POA: Diagnosis not present

## 2024-06-16 DIAGNOSIS — J3089 Other allergic rhinitis: Secondary | ICD-10-CM | POA: Diagnosis not present

## 2024-10-02 ENCOUNTER — Encounter: Admitting: Nurse Practitioner
